# Patient Record
Sex: Female | Born: 1945
Health system: Southern US, Community
[De-identification: ages and names within clinical notes are randomized; demographics above are authoritative.]

## PROBLEM LIST (undated history)

## (undated) DIAGNOSIS — C4442 Squamous cell carcinoma of skin of scalp and neck: Secondary | ICD-10-CM

## (undated) DIAGNOSIS — J189 Pneumonia, unspecified organism: Secondary | ICD-10-CM

## (undated) DIAGNOSIS — M1990 Unspecified osteoarthritis, unspecified site: Secondary | ICD-10-CM

## (undated) DIAGNOSIS — I422 Other hypertrophic cardiomyopathy: Secondary | ICD-10-CM

## (undated) DIAGNOSIS — R7303 Prediabetes: Secondary | ICD-10-CM

## (undated) DIAGNOSIS — D239 Other benign neoplasm of skin, unspecified: Secondary | ICD-10-CM

## (undated) DIAGNOSIS — I219 Acute myocardial infarction, unspecified: Secondary | ICD-10-CM

## (undated) DIAGNOSIS — J90 Pleural effusion, not elsewhere classified: Secondary | ICD-10-CM

## (undated) DIAGNOSIS — Z860100 Personal history of colon polyps, unspecified: Secondary | ICD-10-CM

## (undated) DIAGNOSIS — D649 Anemia, unspecified: Secondary | ICD-10-CM

## (undated) DIAGNOSIS — I499 Cardiac arrhythmia, unspecified: Secondary | ICD-10-CM

## (undated) DIAGNOSIS — N189 Chronic kidney disease, unspecified: Secondary | ICD-10-CM

## (undated) DIAGNOSIS — I509 Heart failure, unspecified: Secondary | ICD-10-CM

## (undated) HISTORY — DX: Pleural effusion, not elsewhere classified: J90

## (undated) HISTORY — DX: Unspecified osteoarthritis, unspecified site: M19.90

## (undated) HISTORY — PX: DILATION AND CURETTAGE OF UTERUS: SHX78

## (undated) HISTORY — DX: Other benign neoplasm of skin, unspecified: D23.9

## (undated) HISTORY — DX: Personal history of colonic polyps: Z86.010

## (undated) HISTORY — DX: Personal history of colon polyps, unspecified: Z86.0100

## (undated) HISTORY — DX: Anemia, unspecified: D64.9

---
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
## 1966-06-06 HISTORY — PX: DILATION AND CURETTAGE OF UTERUS: SHX78

## 1971-06-07 HISTORY — PX: BREAST BIOPSY: SHX20

## 1975-06-07 HISTORY — PX: TUBAL LIGATION: SHX77

## 2012-03-09 DIAGNOSIS — N3000 Acute cystitis without hematuria: Secondary | ICD-10-CM | POA: Diagnosis not present

## 2012-03-09 DIAGNOSIS — Z1231 Encounter for screening mammogram for malignant neoplasm of breast: Secondary | ICD-10-CM | POA: Diagnosis not present

## 2012-03-09 DIAGNOSIS — I10 Essential (primary) hypertension: Secondary | ICD-10-CM | POA: Diagnosis not present

## 2012-03-09 DIAGNOSIS — Z23 Encounter for immunization: Secondary | ICD-10-CM | POA: Diagnosis not present

## 2012-03-09 DIAGNOSIS — I119 Hypertensive heart disease without heart failure: Secondary | ICD-10-CM | POA: Diagnosis not present

## 2012-03-09 DIAGNOSIS — Z79899 Other long term (current) drug therapy: Secondary | ICD-10-CM | POA: Diagnosis not present

## 2012-03-09 DIAGNOSIS — Z124 Encounter for screening for malignant neoplasm of cervix: Secondary | ICD-10-CM | POA: Diagnosis not present

## 2012-03-09 DIAGNOSIS — Z1211 Encounter for screening for malignant neoplasm of colon: Secondary | ICD-10-CM | POA: Diagnosis not present

## 2012-03-09 DIAGNOSIS — H8109 Meniere's disease, unspecified ear: Secondary | ICD-10-CM | POA: Diagnosis not present

## 2012-03-13 DIAGNOSIS — R9431 Abnormal electrocardiogram [ECG] [EKG]: Secondary | ICD-10-CM | POA: Diagnosis not present

## 2012-03-13 DIAGNOSIS — I369 Nonrheumatic tricuspid valve disorder, unspecified: Secondary | ICD-10-CM | POA: Diagnosis not present

## 2012-03-13 DIAGNOSIS — I119 Hypertensive heart disease without heart failure: Secondary | ICD-10-CM | POA: Diagnosis not present

## 2012-03-22 DIAGNOSIS — N3000 Acute cystitis without hematuria: Secondary | ICD-10-CM | POA: Diagnosis not present

## 2012-04-30 DIAGNOSIS — Z1231 Encounter for screening mammogram for malignant neoplasm of breast: Secondary | ICD-10-CM | POA: Diagnosis not present

## 2012-06-19 DIAGNOSIS — N182 Chronic kidney disease, stage 2 (mild): Secondary | ICD-10-CM | POA: Diagnosis not present

## 2013-01-24 DIAGNOSIS — M25569 Pain in unspecified knee: Secondary | ICD-10-CM | POA: Diagnosis not present

## 2013-02-28 DIAGNOSIS — H905 Unspecified sensorineural hearing loss: Secondary | ICD-10-CM | POA: Diagnosis not present

## 2013-02-28 DIAGNOSIS — H903 Sensorineural hearing loss, bilateral: Secondary | ICD-10-CM | POA: Diagnosis not present

## 2013-02-28 DIAGNOSIS — H8109 Meniere's disease, unspecified ear: Secondary | ICD-10-CM | POA: Diagnosis not present

## 2013-03-18 DIAGNOSIS — Z23 Encounter for immunization: Secondary | ICD-10-CM | POA: Diagnosis not present

## 2013-03-18 DIAGNOSIS — Z1211 Encounter for screening for malignant neoplasm of colon: Secondary | ICD-10-CM | POA: Diagnosis not present

## 2013-03-18 DIAGNOSIS — Z79899 Other long term (current) drug therapy: Secondary | ICD-10-CM | POA: Diagnosis not present

## 2013-03-18 DIAGNOSIS — H8109 Meniere's disease, unspecified ear: Secondary | ICD-10-CM | POA: Diagnosis not present

## 2013-03-18 DIAGNOSIS — Z0000 Encounter for general adult medical examination without abnormal findings: Secondary | ICD-10-CM | POA: Diagnosis not present

## 2013-03-18 DIAGNOSIS — E782 Mixed hyperlipidemia: Secondary | ICD-10-CM | POA: Diagnosis not present

## 2013-03-18 DIAGNOSIS — M899 Disorder of bone, unspecified: Secondary | ICD-10-CM | POA: Diagnosis not present

## 2013-03-18 DIAGNOSIS — I10 Essential (primary) hypertension: Secondary | ICD-10-CM | POA: Diagnosis not present

## 2013-04-17 DIAGNOSIS — I10 Essential (primary) hypertension: Secondary | ICD-10-CM | POA: Diagnosis not present

## 2013-04-17 DIAGNOSIS — H25049 Posterior subcapsular polar age-related cataract, unspecified eye: Secondary | ICD-10-CM | POA: Diagnosis not present

## 2013-04-17 DIAGNOSIS — H43819 Vitreous degeneration, unspecified eye: Secondary | ICD-10-CM | POA: Diagnosis not present

## 2013-04-17 DIAGNOSIS — H524 Presbyopia: Secondary | ICD-10-CM | POA: Diagnosis not present

## 2013-04-29 DIAGNOSIS — M79609 Pain in unspecified limb: Secondary | ICD-10-CM | POA: Diagnosis not present

## 2013-04-29 DIAGNOSIS — R609 Edema, unspecified: Secondary | ICD-10-CM | POA: Diagnosis not present

## 2013-04-29 DIAGNOSIS — I8000 Phlebitis and thrombophlebitis of superficial vessels of unspecified lower extremity: Secondary | ICD-10-CM | POA: Diagnosis not present

## 2013-05-01 DIAGNOSIS — Z1231 Encounter for screening mammogram for malignant neoplasm of breast: Secondary | ICD-10-CM | POA: Diagnosis not present

## 2014-03-12 DIAGNOSIS — I700 Atherosclerosis of aorta: Secondary | ICD-10-CM | POA: Diagnosis not present

## 2014-03-12 DIAGNOSIS — R002 Palpitations: Secondary | ICD-10-CM | POA: Diagnosis not present

## 2014-03-12 DIAGNOSIS — R072 Precordial pain: Secondary | ICD-10-CM | POA: Diagnosis not present

## 2014-03-12 DIAGNOSIS — I499 Cardiac arrhythmia, unspecified: Secondary | ICD-10-CM | POA: Diagnosis not present

## 2014-03-12 DIAGNOSIS — R079 Chest pain, unspecified: Secondary | ICD-10-CM | POA: Diagnosis not present

## 2014-03-12 DIAGNOSIS — R0602 Shortness of breath: Secondary | ICD-10-CM | POA: Diagnosis not present

## 2014-03-12 DIAGNOSIS — I4891 Unspecified atrial fibrillation: Secondary | ICD-10-CM | POA: Diagnosis not present

## 2014-03-13 DIAGNOSIS — I499 Cardiac arrhythmia, unspecified: Secondary | ICD-10-CM | POA: Diagnosis not present

## 2014-03-13 DIAGNOSIS — I480 Paroxysmal atrial fibrillation: Secondary | ICD-10-CM | POA: Diagnosis not present

## 2014-03-13 DIAGNOSIS — Z23 Encounter for immunization: Secondary | ICD-10-CM | POA: Diagnosis not present

## 2014-03-13 DIAGNOSIS — R0602 Shortness of breath: Secondary | ICD-10-CM | POA: Diagnosis not present

## 2014-03-13 DIAGNOSIS — I700 Atherosclerosis of aorta: Secondary | ICD-10-CM | POA: Diagnosis not present

## 2014-03-20 DIAGNOSIS — Z8639 Personal history of other endocrine, nutritional and metabolic disease: Secondary | ICD-10-CM | POA: Diagnosis not present

## 2014-03-20 DIAGNOSIS — I4891 Unspecified atrial fibrillation: Secondary | ICD-10-CM | POA: Diagnosis not present

## 2014-03-20 DIAGNOSIS — Z7901 Long term (current) use of anticoagulants: Secondary | ICD-10-CM | POA: Diagnosis not present

## 2014-03-20 DIAGNOSIS — I119 Hypertensive heart disease without heart failure: Secondary | ICD-10-CM | POA: Diagnosis not present

## 2014-03-20 DIAGNOSIS — Z8709 Personal history of other diseases of the respiratory system: Secondary | ICD-10-CM | POA: Diagnosis not present

## 2014-04-09 DIAGNOSIS — Z0000 Encounter for general adult medical examination without abnormal findings: Secondary | ICD-10-CM | POA: Diagnosis not present

## 2014-04-09 DIAGNOSIS — Z79899 Other long term (current) drug therapy: Secondary | ICD-10-CM | POA: Diagnosis not present

## 2014-04-09 DIAGNOSIS — I480 Paroxysmal atrial fibrillation: Secondary | ICD-10-CM | POA: Diagnosis not present

## 2014-04-09 DIAGNOSIS — I119 Hypertensive heart disease without heart failure: Secondary | ICD-10-CM | POA: Diagnosis not present

## 2014-04-09 DIAGNOSIS — H8109 Meniere's disease, unspecified ear: Secondary | ICD-10-CM | POA: Diagnosis not present

## 2014-04-09 DIAGNOSIS — Z1211 Encounter for screening for malignant neoplasm of colon: Secondary | ICD-10-CM | POA: Diagnosis not present

## 2014-04-09 DIAGNOSIS — E780 Pure hypercholesterolemia: Secondary | ICD-10-CM | POA: Diagnosis not present

## 2014-04-09 DIAGNOSIS — I4891 Unspecified atrial fibrillation: Secondary | ICD-10-CM | POA: Diagnosis not present

## 2014-04-09 DIAGNOSIS — I10 Essential (primary) hypertension: Secondary | ICD-10-CM | POA: Diagnosis not present

## 2014-04-14 DIAGNOSIS — R151 Fecal smearing: Secondary | ICD-10-CM | POA: Diagnosis not present

## 2014-04-14 DIAGNOSIS — R278 Other lack of coordination: Secondary | ICD-10-CM | POA: Diagnosis not present

## 2014-04-14 DIAGNOSIS — M6281 Muscle weakness (generalized): Secondary | ICD-10-CM | POA: Diagnosis not present

## 2014-04-14 DIAGNOSIS — N3946 Mixed incontinence: Secondary | ICD-10-CM | POA: Diagnosis not present

## 2014-04-21 DIAGNOSIS — R151 Fecal smearing: Secondary | ICD-10-CM | POA: Diagnosis not present

## 2014-04-21 DIAGNOSIS — N3946 Mixed incontinence: Secondary | ICD-10-CM | POA: Diagnosis not present

## 2014-04-21 DIAGNOSIS — M6281 Muscle weakness (generalized): Secondary | ICD-10-CM | POA: Diagnosis not present

## 2014-04-21 DIAGNOSIS — R278 Other lack of coordination: Secondary | ICD-10-CM | POA: Diagnosis not present

## 2014-05-02 DIAGNOSIS — Z1231 Encounter for screening mammogram for malignant neoplasm of breast: Secondary | ICD-10-CM | POA: Diagnosis not present

## 2014-05-06 DIAGNOSIS — M6281 Muscle weakness (generalized): Secondary | ICD-10-CM | POA: Diagnosis not present

## 2014-05-06 DIAGNOSIS — R278 Other lack of coordination: Secondary | ICD-10-CM | POA: Diagnosis not present

## 2014-05-06 DIAGNOSIS — R151 Fecal smearing: Secondary | ICD-10-CM | POA: Diagnosis not present

## 2014-05-06 DIAGNOSIS — N3946 Mixed incontinence: Secondary | ICD-10-CM | POA: Diagnosis not present

## 2014-05-20 DIAGNOSIS — N3946 Mixed incontinence: Secondary | ICD-10-CM | POA: Diagnosis not present

## 2014-05-20 DIAGNOSIS — R151 Fecal smearing: Secondary | ICD-10-CM | POA: Diagnosis not present

## 2014-05-20 DIAGNOSIS — R278 Other lack of coordination: Secondary | ICD-10-CM | POA: Diagnosis not present

## 2014-05-20 DIAGNOSIS — M6281 Muscle weakness (generalized): Secondary | ICD-10-CM | POA: Diagnosis not present

## 2014-06-03 DIAGNOSIS — M6281 Muscle weakness (generalized): Secondary | ICD-10-CM | POA: Diagnosis not present

## 2014-06-03 DIAGNOSIS — N3946 Mixed incontinence: Secondary | ICD-10-CM | POA: Diagnosis not present

## 2014-06-03 DIAGNOSIS — R151 Fecal smearing: Secondary | ICD-10-CM | POA: Diagnosis not present

## 2014-06-03 DIAGNOSIS — R278 Other lack of coordination: Secondary | ICD-10-CM | POA: Diagnosis not present

## 2014-07-16 DIAGNOSIS — H5211 Myopia, right eye: Secondary | ICD-10-CM | POA: Diagnosis not present

## 2014-07-16 DIAGNOSIS — H52223 Regular astigmatism, bilateral: Secondary | ICD-10-CM | POA: Diagnosis not present

## 2014-07-16 DIAGNOSIS — H524 Presbyopia: Secondary | ICD-10-CM | POA: Diagnosis not present

## 2014-07-16 DIAGNOSIS — H43312 Vitreous membranes and strands, left eye: Secondary | ICD-10-CM | POA: Diagnosis not present

## 2014-07-16 DIAGNOSIS — H5202 Hypermetropia, left eye: Secondary | ICD-10-CM | POA: Diagnosis not present

## 2014-07-16 DIAGNOSIS — I10 Essential (primary) hypertension: Secondary | ICD-10-CM | POA: Diagnosis not present

## 2014-09-02 DIAGNOSIS — A932 Colorado tick fever: Secondary | ICD-10-CM | POA: Diagnosis not present

## 2014-09-02 DIAGNOSIS — A681 Tick-borne relapsing fever: Secondary | ICD-10-CM | POA: Diagnosis not present

## 2014-09-22 DIAGNOSIS — M654 Radial styloid tenosynovitis [de Quervain]: Secondary | ICD-10-CM | POA: Diagnosis not present

## 2014-11-05 DIAGNOSIS — M654 Radial styloid tenosynovitis [de Quervain]: Secondary | ICD-10-CM | POA: Diagnosis not present

## 2015-04-17 DIAGNOSIS — Z23 Encounter for immunization: Secondary | ICD-10-CM | POA: Diagnosis not present

## 2015-04-17 DIAGNOSIS — I10 Essential (primary) hypertension: Secondary | ICD-10-CM | POA: Diagnosis not present

## 2015-04-17 DIAGNOSIS — Z79899 Other long term (current) drug therapy: Secondary | ICD-10-CM | POA: Diagnosis not present

## 2015-04-17 DIAGNOSIS — Z0000 Encounter for general adult medical examination without abnormal findings: Secondary | ICD-10-CM | POA: Diagnosis not present

## 2015-04-17 DIAGNOSIS — I480 Paroxysmal atrial fibrillation: Secondary | ICD-10-CM | POA: Diagnosis not present

## 2015-04-17 DIAGNOSIS — I119 Hypertensive heart disease without heart failure: Secondary | ICD-10-CM | POA: Diagnosis not present

## 2015-05-11 DIAGNOSIS — Z803 Family history of malignant neoplasm of breast: Secondary | ICD-10-CM | POA: Diagnosis not present

## 2015-05-11 DIAGNOSIS — Z1231 Encounter for screening mammogram for malignant neoplasm of breast: Secondary | ICD-10-CM | POA: Diagnosis not present

## 2015-08-06 DIAGNOSIS — L821 Other seborrheic keratosis: Secondary | ICD-10-CM | POA: Diagnosis not present

## 2015-08-06 DIAGNOSIS — L501 Idiopathic urticaria: Secondary | ICD-10-CM | POA: Diagnosis not present

## 2015-08-17 DIAGNOSIS — J111 Influenza due to unidentified influenza virus with other respiratory manifestations: Secondary | ICD-10-CM | POA: Diagnosis not present

## 2015-08-17 DIAGNOSIS — R05 Cough: Secondary | ICD-10-CM | POA: Diagnosis not present

## 2015-08-17 DIAGNOSIS — R531 Weakness: Secondary | ICD-10-CM | POA: Diagnosis not present

## 2015-08-27 DIAGNOSIS — L501 Idiopathic urticaria: Secondary | ICD-10-CM | POA: Diagnosis not present

## 2015-09-22 DIAGNOSIS — H25813 Combined forms of age-related cataract, bilateral: Secondary | ICD-10-CM | POA: Diagnosis not present

## 2015-09-22 DIAGNOSIS — I10 Essential (primary) hypertension: Secondary | ICD-10-CM | POA: Diagnosis not present

## 2015-09-22 DIAGNOSIS — H52223 Regular astigmatism, bilateral: Secondary | ICD-10-CM | POA: Diagnosis not present

## 2015-09-22 DIAGNOSIS — H5211 Myopia, right eye: Secondary | ICD-10-CM | POA: Diagnosis not present

## 2015-09-22 DIAGNOSIS — H43393 Other vitreous opacities, bilateral: Secondary | ICD-10-CM | POA: Diagnosis not present

## 2015-09-22 DIAGNOSIS — H43813 Vitreous degeneration, bilateral: Secondary | ICD-10-CM | POA: Diagnosis not present

## 2015-09-22 DIAGNOSIS — H5202 Hypermetropia, left eye: Secondary | ICD-10-CM | POA: Diagnosis not present

## 2015-09-22 DIAGNOSIS — H524 Presbyopia: Secondary | ICD-10-CM | POA: Diagnosis not present

## 2016-03-15 DIAGNOSIS — Z23 Encounter for immunization: Secondary | ICD-10-CM | POA: Diagnosis not present

## 2016-04-18 DIAGNOSIS — Z79899 Other long term (current) drug therapy: Secondary | ICD-10-CM | POA: Diagnosis not present

## 2016-04-18 DIAGNOSIS — I480 Paroxysmal atrial fibrillation: Secondary | ICD-10-CM | POA: Diagnosis not present

## 2016-04-18 DIAGNOSIS — I10 Essential (primary) hypertension: Secondary | ICD-10-CM | POA: Diagnosis not present

## 2016-04-18 DIAGNOSIS — M8580 Other specified disorders of bone density and structure, unspecified site: Secondary | ICD-10-CM | POA: Diagnosis not present

## 2016-04-18 DIAGNOSIS — I119 Hypertensive heart disease without heart failure: Secondary | ICD-10-CM | POA: Diagnosis not present

## 2016-04-18 DIAGNOSIS — Z0000 Encounter for general adult medical examination without abnormal findings: Secondary | ICD-10-CM | POA: Diagnosis not present

## 2016-05-03 DIAGNOSIS — D225 Melanocytic nevi of trunk: Secondary | ICD-10-CM | POA: Diagnosis not present

## 2016-05-03 DIAGNOSIS — L814 Other melanin hyperpigmentation: Secondary | ICD-10-CM | POA: Diagnosis not present

## 2016-05-03 DIAGNOSIS — L432 Lichenoid drug reaction: Secondary | ICD-10-CM | POA: Diagnosis not present

## 2016-05-17 DIAGNOSIS — Z1231 Encounter for screening mammogram for malignant neoplasm of breast: Secondary | ICD-10-CM | POA: Diagnosis not present

## 2016-06-27 DIAGNOSIS — D225 Melanocytic nevi of trunk: Secondary | ICD-10-CM | POA: Diagnosis not present

## 2016-06-27 DIAGNOSIS — D224 Melanocytic nevi of scalp and neck: Secondary | ICD-10-CM | POA: Diagnosis not present

## 2016-06-27 DIAGNOSIS — D229 Melanocytic nevi, unspecified: Secondary | ICD-10-CM | POA: Diagnosis not present

## 2016-07-15 DIAGNOSIS — D239 Other benign neoplasm of skin, unspecified: Secondary | ICD-10-CM | POA: Diagnosis not present

## 2016-07-15 DIAGNOSIS — D2262 Melanocytic nevi of left upper limb, including shoulder: Secondary | ICD-10-CM | POA: Diagnosis not present

## 2016-07-15 DIAGNOSIS — D235 Other benign neoplasm of skin of trunk: Secondary | ICD-10-CM | POA: Diagnosis not present

## 2016-07-15 DIAGNOSIS — D225 Melanocytic nevi of trunk: Secondary | ICD-10-CM | POA: Diagnosis not present

## 2016-07-29 DIAGNOSIS — D235 Other benign neoplasm of skin of trunk: Secondary | ICD-10-CM | POA: Diagnosis not present

## 2016-07-29 DIAGNOSIS — D239 Other benign neoplasm of skin, unspecified: Secondary | ICD-10-CM | POA: Diagnosis not present

## 2016-08-01 DIAGNOSIS — D2271 Melanocytic nevi of right lower limb, including hip: Secondary | ICD-10-CM | POA: Diagnosis not present

## 2016-08-01 DIAGNOSIS — D224 Melanocytic nevi of scalp and neck: Secondary | ICD-10-CM | POA: Diagnosis not present

## 2016-08-01 DIAGNOSIS — D2272 Melanocytic nevi of left lower limb, including hip: Secondary | ICD-10-CM | POA: Diagnosis not present

## 2016-08-01 DIAGNOSIS — D2262 Melanocytic nevi of left upper limb, including shoulder: Secondary | ICD-10-CM | POA: Diagnosis not present

## 2016-08-15 DIAGNOSIS — D239 Other benign neoplasm of skin, unspecified: Secondary | ICD-10-CM | POA: Diagnosis not present

## 2016-08-15 DIAGNOSIS — D235 Other benign neoplasm of skin of trunk: Secondary | ICD-10-CM | POA: Diagnosis not present

## 2016-08-15 DIAGNOSIS — D224 Melanocytic nevi of scalp and neck: Secondary | ICD-10-CM | POA: Diagnosis not present

## 2016-08-15 DIAGNOSIS — L988 Other specified disorders of the skin and subcutaneous tissue: Secondary | ICD-10-CM | POA: Diagnosis not present

## 2016-09-29 DIAGNOSIS — Z86010 Personal history of colonic polyps: Secondary | ICD-10-CM | POA: Diagnosis not present

## 2016-09-29 DIAGNOSIS — Z1211 Encounter for screening for malignant neoplasm of colon: Secondary | ICD-10-CM | POA: Diagnosis not present

## 2016-11-07 DIAGNOSIS — K5730 Diverticulosis of large intestine without perforation or abscess without bleeding: Secondary | ICD-10-CM | POA: Diagnosis not present

## 2016-11-07 DIAGNOSIS — D122 Benign neoplasm of ascending colon: Secondary | ICD-10-CM | POA: Diagnosis not present

## 2016-11-07 DIAGNOSIS — K648 Other hemorrhoids: Secondary | ICD-10-CM | POA: Diagnosis not present

## 2016-11-07 DIAGNOSIS — K635 Polyp of colon: Secondary | ICD-10-CM | POA: Diagnosis not present

## 2016-11-07 DIAGNOSIS — Z86010 Personal history of colonic polyps: Secondary | ICD-10-CM | POA: Diagnosis not present

## 2016-11-07 DIAGNOSIS — Z1211 Encounter for screening for malignant neoplasm of colon: Secondary | ICD-10-CM | POA: Diagnosis not present

## 2016-11-07 DIAGNOSIS — I4891 Unspecified atrial fibrillation: Secondary | ICD-10-CM | POA: Diagnosis not present

## 2016-11-07 DIAGNOSIS — I10 Essential (primary) hypertension: Secondary | ICD-10-CM | POA: Diagnosis not present

## 2016-11-07 HISTORY — PX: COLONOSCOPY: SHX174

## 2016-12-01 DIAGNOSIS — H43393 Other vitreous opacities, bilateral: Secondary | ICD-10-CM | POA: Diagnosis not present

## 2016-12-01 DIAGNOSIS — H5213 Myopia, bilateral: Secondary | ICD-10-CM | POA: Diagnosis not present

## 2016-12-01 DIAGNOSIS — H25813 Combined forms of age-related cataract, bilateral: Secondary | ICD-10-CM | POA: Diagnosis not present

## 2016-12-01 DIAGNOSIS — H52223 Regular astigmatism, bilateral: Secondary | ICD-10-CM | POA: Diagnosis not present

## 2016-12-01 DIAGNOSIS — H524 Presbyopia: Secondary | ICD-10-CM | POA: Diagnosis not present

## 2016-12-01 DIAGNOSIS — H43813 Vitreous degeneration, bilateral: Secondary | ICD-10-CM | POA: Diagnosis not present

## 2016-12-01 DIAGNOSIS — I10 Essential (primary) hypertension: Secondary | ICD-10-CM | POA: Diagnosis not present

## 2016-12-14 DIAGNOSIS — L814 Other melanin hyperpigmentation: Secondary | ICD-10-CM | POA: Diagnosis not present

## 2016-12-14 DIAGNOSIS — Z87898 Personal history of other specified conditions: Secondary | ICD-10-CM

## 2016-12-14 DIAGNOSIS — D0322 Melanoma in situ of left ear and external auricular canal: Secondary | ICD-10-CM | POA: Diagnosis not present

## 2016-12-14 DIAGNOSIS — C4322 Malignant melanoma of left ear and external auricular canal: Secondary | ICD-10-CM | POA: Diagnosis not present

## 2016-12-14 DIAGNOSIS — L821 Other seborrheic keratosis: Secondary | ICD-10-CM | POA: Diagnosis not present

## 2016-12-14 DIAGNOSIS — Z872 Personal history of diseases of the skin and subcutaneous tissue: Secondary | ICD-10-CM | POA: Diagnosis not present

## 2016-12-14 DIAGNOSIS — D489 Neoplasm of uncertain behavior, unspecified: Secondary | ICD-10-CM | POA: Diagnosis not present

## 2016-12-14 HISTORY — DX: Personal history of other specified conditions: Z87.898

## 2017-01-16 DIAGNOSIS — M1712 Unilateral primary osteoarthritis, left knee: Secondary | ICD-10-CM | POA: Diagnosis not present

## 2017-01-17 DIAGNOSIS — C4322 Malignant melanoma of left ear and external auricular canal: Secondary | ICD-10-CM | POA: Insufficient documentation

## 2017-01-17 HISTORY — DX: Malignant melanoma of left ear and external auricular canal: C43.22

## 2017-01-23 DIAGNOSIS — C4322 Malignant melanoma of left ear and external auricular canal: Secondary | ICD-10-CM | POA: Diagnosis not present

## 2017-01-30 DIAGNOSIS — I480 Paroxysmal atrial fibrillation: Secondary | ICD-10-CM | POA: Diagnosis not present

## 2017-02-02 DIAGNOSIS — I480 Paroxysmal atrial fibrillation: Secondary | ICD-10-CM | POA: Diagnosis not present

## 2017-02-02 DIAGNOSIS — Z79899 Other long term (current) drug therapy: Secondary | ICD-10-CM | POA: Diagnosis not present

## 2017-02-02 DIAGNOSIS — Z7901 Long term (current) use of anticoagulants: Secondary | ICD-10-CM | POA: Diagnosis not present

## 2017-02-02 DIAGNOSIS — D2222 Melanocytic nevi of left ear and external auricular canal: Secondary | ICD-10-CM | POA: Diagnosis not present

## 2017-02-02 DIAGNOSIS — H8109 Meniere's disease, unspecified ear: Secondary | ICD-10-CM | POA: Diagnosis not present

## 2017-02-02 DIAGNOSIS — M1990 Unspecified osteoarthritis, unspecified site: Secondary | ICD-10-CM | POA: Diagnosis not present

## 2017-02-02 DIAGNOSIS — I10 Essential (primary) hypertension: Secondary | ICD-10-CM | POA: Diagnosis not present

## 2017-02-02 DIAGNOSIS — Z9049 Acquired absence of other specified parts of digestive tract: Secondary | ICD-10-CM | POA: Diagnosis not present

## 2017-02-02 DIAGNOSIS — C4322 Malignant melanoma of left ear and external auricular canal: Secondary | ICD-10-CM | POA: Diagnosis not present

## 2017-02-13 DIAGNOSIS — C4322 Malignant melanoma of left ear and external auricular canal: Secondary | ICD-10-CM | POA: Diagnosis not present

## 2017-02-17 DIAGNOSIS — M1712 Unilateral primary osteoarthritis, left knee: Secondary | ICD-10-CM | POA: Diagnosis not present

## 2017-02-20 DIAGNOSIS — Z23 Encounter for immunization: Secondary | ICD-10-CM | POA: Diagnosis not present

## 2017-02-27 DIAGNOSIS — C4322 Malignant melanoma of left ear and external auricular canal: Secondary | ICD-10-CM | POA: Diagnosis not present

## 2017-04-26 DIAGNOSIS — L304 Erythema intertrigo: Secondary | ICD-10-CM | POA: Diagnosis not present

## 2017-04-26 DIAGNOSIS — N3941 Urge incontinence: Secondary | ICD-10-CM | POA: Diagnosis not present

## 2017-04-26 DIAGNOSIS — D239 Other benign neoplasm of skin, unspecified: Secondary | ICD-10-CM | POA: Diagnosis not present

## 2017-04-26 DIAGNOSIS — I480 Paroxysmal atrial fibrillation: Secondary | ICD-10-CM | POA: Diagnosis not present

## 2017-04-26 DIAGNOSIS — Z79899 Other long term (current) drug therapy: Secondary | ICD-10-CM | POA: Diagnosis not present

## 2017-04-26 DIAGNOSIS — Z0000 Encounter for general adult medical examination without abnormal findings: Secondary | ICD-10-CM | POA: Diagnosis not present

## 2017-05-22 DIAGNOSIS — C4322 Malignant melanoma of left ear and external auricular canal: Secondary | ICD-10-CM | POA: Diagnosis not present

## 2017-06-02 DIAGNOSIS — Z1231 Encounter for screening mammogram for malignant neoplasm of breast: Secondary | ICD-10-CM | POA: Diagnosis not present

## 2017-06-06 HISTORY — PX: CATARACT EXTRACTION, BILATERAL: SHX1313

## 2017-06-06 HISTORY — PX: MOHS SURGERY: SHX181

## 2017-06-14 DIAGNOSIS — Z85820 Personal history of malignant melanoma of skin: Secondary | ICD-10-CM | POA: Diagnosis not present

## 2017-06-14 DIAGNOSIS — D489 Neoplasm of uncertain behavior, unspecified: Secondary | ICD-10-CM | POA: Diagnosis not present

## 2017-06-14 DIAGNOSIS — L814 Other melanin hyperpigmentation: Secondary | ICD-10-CM | POA: Diagnosis not present

## 2017-06-14 DIAGNOSIS — D229 Melanocytic nevi, unspecified: Secondary | ICD-10-CM | POA: Diagnosis not present

## 2017-06-16 DIAGNOSIS — Z85820 Personal history of malignant melanoma of skin: Secondary | ICD-10-CM

## 2017-06-16 DIAGNOSIS — C4322 Malignant melanoma of left ear and external auricular canal: Secondary | ICD-10-CM | POA: Diagnosis not present

## 2017-06-16 HISTORY — DX: Personal history of malignant melanoma of skin: Z85.820

## 2017-07-11 DIAGNOSIS — C4322 Malignant melanoma of left ear and external auricular canal: Secondary | ICD-10-CM | POA: Diagnosis not present

## 2017-09-18 DIAGNOSIS — J329 Chronic sinusitis, unspecified: Secondary | ICD-10-CM | POA: Diagnosis not present

## 2017-09-18 DIAGNOSIS — J40 Bronchitis, not specified as acute or chronic: Secondary | ICD-10-CM | POA: Diagnosis not present

## 2017-09-18 DIAGNOSIS — R509 Fever, unspecified: Secondary | ICD-10-CM | POA: Diagnosis not present

## 2017-09-18 DIAGNOSIS — R062 Wheezing: Secondary | ICD-10-CM | POA: Diagnosis not present

## 2017-09-19 DIAGNOSIS — Z87898 Personal history of other specified conditions: Secondary | ICD-10-CM | POA: Diagnosis not present

## 2017-09-19 DIAGNOSIS — Z85820 Personal history of malignant melanoma of skin: Secondary | ICD-10-CM | POA: Diagnosis not present

## 2017-09-19 DIAGNOSIS — L814 Other melanin hyperpigmentation: Secondary | ICD-10-CM | POA: Diagnosis not present

## 2017-09-19 DIAGNOSIS — L821 Other seborrheic keratosis: Secondary | ICD-10-CM | POA: Diagnosis not present

## 2017-11-20 DIAGNOSIS — C4322 Malignant melanoma of left ear and external auricular canal: Secondary | ICD-10-CM | POA: Diagnosis not present

## 2017-11-27 DIAGNOSIS — M1712 Unilateral primary osteoarthritis, left knee: Secondary | ICD-10-CM | POA: Diagnosis not present

## 2017-12-19 DIAGNOSIS — L814 Other melanin hyperpigmentation: Secondary | ICD-10-CM | POA: Diagnosis not present

## 2017-12-19 DIAGNOSIS — L821 Other seborrheic keratosis: Secondary | ICD-10-CM | POA: Diagnosis not present

## 2017-12-19 DIAGNOSIS — Z85820 Personal history of malignant melanoma of skin: Secondary | ICD-10-CM | POA: Diagnosis not present

## 2017-12-19 DIAGNOSIS — L7211 Pilar cyst: Secondary | ICD-10-CM | POA: Diagnosis not present

## 2017-12-19 DIAGNOSIS — Z87898 Personal history of other specified conditions: Secondary | ICD-10-CM | POA: Diagnosis not present

## 2018-01-30 DIAGNOSIS — H5211 Myopia, right eye: Secondary | ICD-10-CM | POA: Diagnosis not present

## 2018-01-30 DIAGNOSIS — H5202 Hypermetropia, left eye: Secondary | ICD-10-CM | POA: Diagnosis not present

## 2018-01-30 DIAGNOSIS — H52223 Regular astigmatism, bilateral: Secondary | ICD-10-CM | POA: Diagnosis not present

## 2018-01-30 DIAGNOSIS — H25813 Combined forms of age-related cataract, bilateral: Secondary | ICD-10-CM | POA: Diagnosis not present

## 2018-01-30 DIAGNOSIS — I10 Essential (primary) hypertension: Secondary | ICD-10-CM | POA: Diagnosis not present

## 2018-01-30 DIAGNOSIS — H524 Presbyopia: Secondary | ICD-10-CM | POA: Diagnosis not present

## 2018-01-30 DIAGNOSIS — H43813 Vitreous degeneration, bilateral: Secondary | ICD-10-CM | POA: Diagnosis not present

## 2018-02-27 DIAGNOSIS — Z23 Encounter for immunization: Secondary | ICD-10-CM | POA: Diagnosis not present

## 2018-03-02 ENCOUNTER — Ambulatory Visit (INDEPENDENT_AMBULATORY_CARE_PROVIDER_SITE_OTHER): Payer: Medicare Other

## 2018-03-02 ENCOUNTER — Encounter: Payer: Self-pay | Admitting: Sports Medicine

## 2018-03-02 ENCOUNTER — Other Ambulatory Visit: Payer: Self-pay

## 2018-03-02 ENCOUNTER — Ambulatory Visit (INDEPENDENT_AMBULATORY_CARE_PROVIDER_SITE_OTHER): Payer: Medicare Other | Admitting: Sports Medicine

## 2018-03-02 DIAGNOSIS — M722 Plantar fascial fibromatosis: Secondary | ICD-10-CM | POA: Diagnosis not present

## 2018-03-02 DIAGNOSIS — M79672 Pain in left foot: Secondary | ICD-10-CM | POA: Diagnosis not present

## 2018-03-02 MED ORDER — TRIAMCINOLONE ACETONIDE 40 MG/ML IJ SUSP: 20.0000 mg | Freq: Once | INTRAMUSCULAR | Status: DC

## 2018-03-02 NOTE — Progress Notes (Signed)
   Subjective:    Patient ID: Margaretha Sheffield, female    DOB: [birth year], 72 y.o.   MRN: [id number]  HPI    Review of Systems  Musculoskeletal: Positive for myalgias.  All other systems reviewed and are negative.      Objective:   Physical Exam        Assessment & Plan:

## 2018-03-02 NOTE — Patient Instructions (Signed)

## 2018-03-02 NOTE — Progress Notes (Signed)
Subjective: Monica Anderson is a 72 y.o. female patient presents to office with complaint of moderate heel pain on the left. Patient admits to post static dyskinesia for 1 week in duration. Patient reports that after she did mowing on yesterday pain was 10 out of 10 but before that the area was sore ranks 6 out of 10 does not recall any injury admits to a history of plantar fasciitis 30 years ago states that today her pain is 4-5 out of 10 and states that the pain is worse in the morning with first few steps out of bed.  Patient has tried Aleve and Voltaren gel states that she has history of issues with her knees which could have also been contributing to her recent flareup of pain.  Denies any other pedal complaints.   Review of Systems  Musculoskeletal: Positive for myalgias.  All other systems reviewed and are negative.    There are no active problems to display for this patient.   No current outpatient medications on file prior to visit.   No current facility-administered medications on file prior to visit.     Allergies not on file  Objective: Physical Exam General: The patient is alert and oriented x3 in no acute distress.  Dermatology: Skin is warm, dry and supple bilateral lower extremities. Nails 1-10 are normal. There is no erythema, edema, no eccymosis, no open lesions present. Integument is otherwise unremarkable.  Vascular: Dorsalis Pedis pulse and Posterior Tibial pulse are 1/4 bilateral. Capillary fill time is immediate to all digits.  Neurological: Grossly intact to light touch with an achilles reflex of +2/5 and a  negative Tinel's sign bilateral.  Musculoskeletal: Tenderness to palpation at the medial calcaneal tubercale and through the insertion of the plantar fascia on the left foot. No pain with compression of calcaneus bilateral. No pain with tuning fork to calcaneus bilateral. No pain with calf compression bilateral. There is decreased Ankle joint range of motion  bilateral. All other joints range of motion within normal limits bilateral. Strength 5/5 in all groups bilateral.   Xray, Left foot:  Normal osseous mineralization. Joint spaces preserved. No fracture/dislocation/boney destruction. Calcaneal spur present with mild thickening of plantar fascia. No other soft tissue abnormalities or radiopaque foreign bodies.   Assessment and Plan: Problem List Items Addressed This Visit    None    Visit Diagnoses    Plantar fasciitis of left foot    -  Primary   Relevant Medications   triamcinolone acetonide (KENALOG-40) injection 20 mg (Start on 03/02/2018  3:00 PM)   Left foot pain       Relevant Medications   triamcinolone acetonide (KENALOG-40) injection 20 mg (Start on 03/02/2018  3:00 PM)   Other Relevant Orders   DG Foot Complete Left      -Complete examination performed.  -Xrays reviewed -Discussed with patient in detail the condition of plantar fasciitis, how this occurs and general treatment options. Explained both conservative and surgical treatments.  -After oral consent and aseptic prep, injected a mixture containing 1 ml of 2%  plain lidocaine, 1 ml 0.5% plain marcaine, 0.5 ml of kenalog 40 and 0.5 ml of dexamethasone phosphate into left heel. Post-injection care discussed with patient.  -Recommended good supportive shoes and advised use of OTC insert. Explained to patient that if these orthoses work well, we will continue with these. If these do not improve her condition and  pain, we will consider custom molded orthoses. -Explained and dispensed to patient daily stretching  exercises. -Recommend patient to ice affected area 1-2x daily. -Patient to return to office in 3 weeks for follow up or sooner if problems or questions arise.  Landis Martins, DPM

## 2018-03-07 ENCOUNTER — Other Ambulatory Visit: Payer: Self-pay | Admitting: Sports Medicine

## 2018-03-07 DIAGNOSIS — M79672 Pain in left foot: Secondary | ICD-10-CM

## 2018-03-07 DIAGNOSIS — M722 Plantar fascial fibromatosis: Secondary | ICD-10-CM

## 2018-03-20 DIAGNOSIS — H1859 Other hereditary corneal dystrophies: Secondary | ICD-10-CM | POA: Diagnosis not present

## 2018-03-20 DIAGNOSIS — Z01818 Encounter for other preprocedural examination: Secondary | ICD-10-CM | POA: Diagnosis not present

## 2018-03-20 DIAGNOSIS — H25812 Combined forms of age-related cataract, left eye: Secondary | ICD-10-CM | POA: Diagnosis not present

## 2018-03-23 ENCOUNTER — Encounter: Payer: Self-pay | Admitting: Sports Medicine

## 2018-03-23 ENCOUNTER — Ambulatory Visit (INDEPENDENT_AMBULATORY_CARE_PROVIDER_SITE_OTHER): Payer: Medicare Other | Admitting: Sports Medicine

## 2018-03-23 DIAGNOSIS — M79672 Pain in left foot: Secondary | ICD-10-CM

## 2018-03-23 DIAGNOSIS — M722 Plantar fascial fibromatosis: Secondary | ICD-10-CM | POA: Diagnosis not present

## 2018-03-23 NOTE — Progress Notes (Signed)
Subjective: Monica Anderson is a 72 y.o. female returns to office for follow up evaluation after Left heel injection for plantar fasciitis, injection #1 administered 3 weeks ago. Patient states that the injection seems to help her pain; pain is now much better and has decreased in frequency to the area. Patient reports that she did have one episode where she feels a sharp pulling sensation that made her pain for a brief moment 10 out of 10 but otherwise no other episodes like this and feels like she is doing much better.  Patient denies any recent changes in medications or new problems since last visit.   There are no active problems to display for this patient.   Current Outpatient Medications on File Prior to Visit  Medication Sig Dispense Refill  . diclofenac sodium (VOLTAREN) 1 % GEL 2 (TWO) GRAM(S) APPLY 2 GRAMS TO AREA UP TO 4 TIMES DAILY  2  . diltiazem (CARDIZEM CD) 120 MG 24 hr capsule Take by mouth daily.  3  . ELIQUIS 5 MG TABS tablet Take 5 mg by mouth 2 (two) times daily.  3  . potassium chloride (K-DUR) 10 MEQ tablet Take 10 mEq by mouth daily.  12  . rosuvastatin (CRESTOR) 5 MG tablet Take 5 mg by mouth every evening.  12  . spironolactone-hydrochlorothiazide (ALDACTAZIDE) 25-25 MG tablet Take 0.5 tablets by mouth daily.  3  . tolterodine (DETROL LA) 4 MG 24 hr capsule Take 4 mg by mouth every evening.  12   Current Facility-Administered Medications on File Prior to Visit  Medication Dose Route Frequency Provider Last Rate Last Dose  . triamcinolone acetonide (KENALOG-40) injection 20 mg  20 mg Other Once Landis Martins, DPM        Not on File  Objective:   General:  Alert and oriented x 3, in no acute distress  Dermatology: Skin is warm, dry, and supple bilateral. Nails are within normal limits. There is no lower extremity erythema, no eccymosis, no open lesions present bilateral.   Vascular: Dorsalis Pedis and Posterior Tibial pedal pulses are 1/4 bilateral. + hair  growth noted bilateral. Capillary Fill Time is 3 seconds in all digits. No varicosities, No edema bilateral lower extremities.   Neurological: Sensation grossly intact to light touch with an achilles reflex of +2 and a  negative Tinel's sign bilateral. Vibratory, sharp/dull, Semmes Weinstein Monofilament within normal limits.   Musculoskeletal: There is decreased tenderness to palpation at the medial calcaneal tubercale and through the insertion of the plantar fascia on the Left foot. No pain with compression to calcaneus or application of tuning fork. There is decreased Ankle joint range of motion bilateral. All other jointsrange of motion  within normal limits bilateral. Strength 5/5 bilateral.   Assessment and Plan: Problem List Items Addressed This Visit    None    Visit Diagnoses    Plantar fasciitis of left foot    -  Primary   Left foot pain          -Complete examination performed.  -Previous x-rays reviewed. -Discussed with patient in detail the condition of plantar fasciitis, how this  occurs related to the foot type of the patient and general treatment options. -Since pain is much improved no reinjection at this visit. -Dispensed Left fascial brace to use at least for 1 month -Continue with stretching, icing, good supportive shoes, inserts daily.  -Discussed long term care and reocurrence; will closely monitor; if fails to improve will consider other treatment modalities.  -  Patient to return to office if the area flares up as needed for additional treatment and advised patient if she does experience a flareup that we will need to do a second injection.  Landis Martins, DPM

## 2018-04-17 DIAGNOSIS — E7800 Pure hypercholesterolemia, unspecified: Secondary | ICD-10-CM | POA: Diagnosis not present

## 2018-04-17 DIAGNOSIS — Z9842 Cataract extraction status, left eye: Secondary | ICD-10-CM | POA: Diagnosis not present

## 2018-04-17 DIAGNOSIS — Z9841 Cataract extraction status, right eye: Secondary | ICD-10-CM | POA: Diagnosis not present

## 2018-04-17 DIAGNOSIS — I10 Essential (primary) hypertension: Secondary | ICD-10-CM | POA: Diagnosis not present

## 2018-04-17 DIAGNOSIS — Z79899 Other long term (current) drug therapy: Secondary | ICD-10-CM | POA: Diagnosis not present

## 2018-04-17 DIAGNOSIS — H8109 Meniere's disease, unspecified ear: Secondary | ICD-10-CM | POA: Diagnosis not present

## 2018-04-17 DIAGNOSIS — I4891 Unspecified atrial fibrillation: Secondary | ICD-10-CM | POA: Diagnosis not present

## 2018-04-17 DIAGNOSIS — Z7901 Long term (current) use of anticoagulants: Secondary | ICD-10-CM | POA: Diagnosis not present

## 2018-04-17 DIAGNOSIS — H25812 Combined forms of age-related cataract, left eye: Secondary | ICD-10-CM | POA: Diagnosis not present

## 2018-04-17 DIAGNOSIS — Z961 Presence of intraocular lens: Secondary | ICD-10-CM | POA: Diagnosis not present

## 2018-04-17 DIAGNOSIS — H259 Unspecified age-related cataract: Secondary | ICD-10-CM | POA: Diagnosis not present

## 2018-04-30 DIAGNOSIS — Z0000 Encounter for general adult medical examination without abnormal findings: Secondary | ICD-10-CM | POA: Diagnosis not present

## 2018-04-30 DIAGNOSIS — E782 Mixed hyperlipidemia: Secondary | ICD-10-CM | POA: Diagnosis not present

## 2018-04-30 DIAGNOSIS — Z79899 Other long term (current) drug therapy: Secondary | ICD-10-CM | POA: Diagnosis not present

## 2018-04-30 DIAGNOSIS — I10 Essential (primary) hypertension: Secondary | ICD-10-CM | POA: Diagnosis not present

## 2018-05-01 DIAGNOSIS — L820 Inflamed seborrheic keratosis: Secondary | ICD-10-CM | POA: Diagnosis not present

## 2018-05-01 DIAGNOSIS — L814 Other melanin hyperpigmentation: Secondary | ICD-10-CM | POA: Diagnosis not present

## 2018-05-01 DIAGNOSIS — L720 Epidermal cyst: Secondary | ICD-10-CM | POA: Diagnosis not present

## 2018-05-01 DIAGNOSIS — Z85820 Personal history of malignant melanoma of skin: Secondary | ICD-10-CM | POA: Diagnosis not present

## 2018-05-01 DIAGNOSIS — Z87898 Personal history of other specified conditions: Secondary | ICD-10-CM | POA: Diagnosis not present

## 2018-05-01 DIAGNOSIS — L7211 Pilar cyst: Secondary | ICD-10-CM | POA: Diagnosis not present

## 2018-05-01 DIAGNOSIS — L821 Other seborrheic keratosis: Secondary | ICD-10-CM | POA: Diagnosis not present

## 2018-06-04 DIAGNOSIS — Z1231 Encounter for screening mammogram for malignant neoplasm of breast: Secondary | ICD-10-CM | POA: Diagnosis not present

## 2018-06-05 DIAGNOSIS — H25811 Combined forms of age-related cataract, right eye: Secondary | ICD-10-CM | POA: Diagnosis not present

## 2018-06-05 DIAGNOSIS — I10 Essential (primary) hypertension: Secondary | ICD-10-CM | POA: Diagnosis not present

## 2018-06-05 DIAGNOSIS — Z961 Presence of intraocular lens: Secondary | ICD-10-CM | POA: Diagnosis not present

## 2018-06-05 DIAGNOSIS — I4891 Unspecified atrial fibrillation: Secondary | ICD-10-CM | POA: Diagnosis not present

## 2018-06-05 DIAGNOSIS — Z7901 Long term (current) use of anticoagulants: Secondary | ICD-10-CM | POA: Diagnosis not present

## 2018-06-05 DIAGNOSIS — H8109 Meniere's disease, unspecified ear: Secondary | ICD-10-CM | POA: Diagnosis not present

## 2018-06-05 DIAGNOSIS — H259 Unspecified age-related cataract: Secondary | ICD-10-CM | POA: Diagnosis not present

## 2018-06-05 DIAGNOSIS — Z79899 Other long term (current) drug therapy: Secondary | ICD-10-CM | POA: Diagnosis not present

## 2018-06-05 DIAGNOSIS — Z9842 Cataract extraction status, left eye: Secondary | ICD-10-CM | POA: Diagnosis not present

## 2018-06-05 DIAGNOSIS — M1990 Unspecified osteoarthritis, unspecified site: Secondary | ICD-10-CM | POA: Diagnosis not present

## 2018-06-05 DIAGNOSIS — E7800 Pure hypercholesterolemia, unspecified: Secondary | ICD-10-CM | POA: Diagnosis not present

## 2018-06-05 DIAGNOSIS — Z9841 Cataract extraction status, right eye: Secondary | ICD-10-CM | POA: Diagnosis not present

## 2018-11-14 DIAGNOSIS — D2260 Melanocytic nevi of unspecified upper limb, including shoulder: Secondary | ICD-10-CM | POA: Diagnosis not present

## 2018-11-14 DIAGNOSIS — L821 Other seborrheic keratosis: Secondary | ICD-10-CM | POA: Diagnosis not present

## 2018-11-14 DIAGNOSIS — Z85820 Personal history of malignant melanoma of skin: Secondary | ICD-10-CM | POA: Diagnosis not present

## 2018-11-14 DIAGNOSIS — D2270 Melanocytic nevi of unspecified lower limb, including hip: Secondary | ICD-10-CM | POA: Diagnosis not present

## 2018-11-14 DIAGNOSIS — D225 Melanocytic nevi of trunk: Secondary | ICD-10-CM | POA: Diagnosis not present

## 2018-11-14 DIAGNOSIS — Z87898 Personal history of other specified conditions: Secondary | ICD-10-CM | POA: Diagnosis not present

## 2018-11-14 DIAGNOSIS — L814 Other melanin hyperpigmentation: Secondary | ICD-10-CM | POA: Diagnosis not present

## 2018-11-14 DIAGNOSIS — L570 Actinic keratosis: Secondary | ICD-10-CM | POA: Diagnosis not present

## 2018-12-28 DIAGNOSIS — M1712 Unilateral primary osteoarthritis, left knee: Secondary | ICD-10-CM | POA: Diagnosis not present

## 2018-12-28 DIAGNOSIS — M25562 Pain in left knee: Secondary | ICD-10-CM | POA: Diagnosis not present

## 2019-03-29 DIAGNOSIS — M25562 Pain in left knee: Secondary | ICD-10-CM | POA: Diagnosis not present

## 2019-03-29 DIAGNOSIS — M1712 Unilateral primary osteoarthritis, left knee: Secondary | ICD-10-CM | POA: Diagnosis not present

## 2019-05-06 DIAGNOSIS — N3945 Continuous leakage: Secondary | ICD-10-CM | POA: Diagnosis not present

## 2019-05-17 DIAGNOSIS — L304 Erythema intertrigo: Secondary | ICD-10-CM | POA: Diagnosis not present

## 2019-05-17 DIAGNOSIS — L814 Other melanin hyperpigmentation: Secondary | ICD-10-CM | POA: Diagnosis not present

## 2019-05-17 DIAGNOSIS — L821 Other seborrheic keratosis: Secondary | ICD-10-CM | POA: Diagnosis not present

## 2019-05-17 DIAGNOSIS — L570 Actinic keratosis: Secondary | ICD-10-CM | POA: Diagnosis not present

## 2019-05-17 DIAGNOSIS — D229 Melanocytic nevi, unspecified: Secondary | ICD-10-CM | POA: Diagnosis not present

## 2019-05-17 DIAGNOSIS — Z85820 Personal history of malignant melanoma of skin: Secondary | ICD-10-CM | POA: Diagnosis not present

## 2019-05-17 DIAGNOSIS — L720 Epidermal cyst: Secondary | ICD-10-CM | POA: Diagnosis not present

## 2019-05-17 DIAGNOSIS — Z87898 Personal history of other specified conditions: Secondary | ICD-10-CM | POA: Diagnosis not present

## 2019-08-29 DIAGNOSIS — H8109 Meniere's disease, unspecified ear: Secondary | ICD-10-CM

## 2019-08-29 HISTORY — DX: Meniere's disease, unspecified ear: H81.09

## 2019-10-01 DIAGNOSIS — IMO0001 Reserved for inherently not codable concepts without codable children: Secondary | ICD-10-CM | POA: Insufficient documentation

## 2019-10-01 DIAGNOSIS — H9042 Sensorineural hearing loss, unilateral, left ear, with unrestricted hearing on the contralateral side: Secondary | ICD-10-CM | POA: Insufficient documentation

## 2019-10-01 HISTORY — DX: Reserved for inherently not codable concepts without codable children: IMO0001

## 2019-10-01 HISTORY — DX: Sensorineural hearing loss, unilateral, left ear, with unrestricted hearing on the contralateral side: H90.42

## 2019-10-02 ENCOUNTER — Other Ambulatory Visit: Payer: Self-pay

## 2019-10-02 DIAGNOSIS — I4891 Unspecified atrial fibrillation: Secondary | ICD-10-CM

## 2019-10-02 DIAGNOSIS — Z01818 Encounter for other preprocedural examination: Secondary | ICD-10-CM

## 2019-10-02 DIAGNOSIS — Z9049 Acquired absence of other specified parts of digestive tract: Secondary | ICD-10-CM | POA: Insufficient documentation

## 2019-10-02 HISTORY — DX: Unspecified atrial fibrillation: I48.91

## 2019-10-02 HISTORY — DX: Acquired absence of other specified parts of digestive tract: Z90.49

## 2019-10-02 HISTORY — DX: Encounter for other preprocedural examination: Z01.818

## 2019-10-03 ENCOUNTER — Ambulatory Visit (INDEPENDENT_AMBULATORY_CARE_PROVIDER_SITE_OTHER): Payer: Medicare Other | Admitting: Cardiology

## 2019-10-03 ENCOUNTER — Other Ambulatory Visit: Payer: Self-pay

## 2019-10-03 ENCOUNTER — Encounter: Payer: Self-pay | Admitting: Cardiology

## 2019-10-03 VITALS — BP 98/60 | HR 69 | Ht 66.5 in | Wt 184.0 lb

## 2019-10-03 DIAGNOSIS — I480 Paroxysmal atrial fibrillation: Secondary | ICD-10-CM | POA: Diagnosis not present

## 2019-10-03 DIAGNOSIS — E782 Mixed hyperlipidemia: Secondary | ICD-10-CM

## 2019-10-03 DIAGNOSIS — I10 Essential (primary) hypertension: Secondary | ICD-10-CM

## 2019-10-03 HISTORY — DX: Essential (primary) hypertension: I10

## 2019-10-03 HISTORY — DX: Mixed hyperlipidemia: E78.2

## 2019-10-03 NOTE — Patient Instructions (Signed)
Medication Instructions:  Your physician recommends that you continue on your current medications as directed. Please refer to the Current Medication list given to you today.  *If you need a refill on your cardiac medications before your next appointment, please call your pharmacy*   Lab Work: None.  If you have labs (blood work) drawn today and your tests are completely normal, you will receive your results only by: Marland Kitchen MyChart Message (if you have MyChart) OR . A paper copy in the mail If you have any lab test that is abnormal or we need to change your treatment, we will call you to review the results.   Testing/Procedures: Your physician has requested that you have an echocardiogram. Echocardiography is a painless test that uses sound waves to create images of your heart. It provides your doctor with information about the size and shape of your heart and how well your heart's chambers and valves are working. This procedure takes approximately one hour. There are no restrictions for this procedure.     Follow-Up: At [facility] you and your health needs are our priority.  As part of our continuing mission to provide you with exceptional heart care, we have created designated Provider Care Teams.  These Care Teams include your primary Cardiologist (physician) and Advanced Practice Providers (APPs -  Physician Assistants and Nurse Practitioners) who all work together to provide you with the care you need, when you need it.  We recommend signing up for the patient portal called "MyChart".  Sign up information is provided on this After Visit Summary.  MyChart is used to connect with patients for Virtual Visits (Telemedicine).  Patients are able to view lab/test results, encounter notes, upcoming appointments, etc.  Non-urgent messages can be sent to your provider as well.   To learn more about what you can do with MyChart, go to [email or web address].    Your next appointment:   6  month(s)  The format for your next appointment:   In Person  Provider:   Berniece Salines, DO   Other Instructions   Echocardiogram An echocardiogram is a procedure that uses painless sound waves (ultrasound) to produce an image of the heart. Images from an echocardiogram can provide important information about:  Signs of coronary artery disease (CAD).  Aneurysm detection. An aneurysm is a weak or damaged part of an artery wall that bulges out from the normal force of blood pumping through the body.  Heart size and shape. Changes in the size or shape of the heart can be associated with certain conditions, including heart failure, aneurysm, and CAD.  Heart muscle function.  Heart valve function.  Signs of a past heart attack.  Fluid buildup around the heart.  Thickening of the heart muscle.  A tumor or infectious growth around the heart valves. Tell a health care provider about:  Any allergies you have.  All medicines you are taking, including vitamins, herbs, eye drops, creams, and over-the-counter medicines.  Any blood disorders you have.  Any surgeries you have had.  Any medical conditions you have.  Whether you are pregnant or may be pregnant. What are the risks? Generally, this is a safe procedure. However, problems may occur, including:  Allergic reaction to dye (contrast) that may be used during the procedure. What happens before the procedure? No specific preparation is needed. You may eat and drink normally. What happens during the procedure?   An IV tube may be inserted into one of your veins.  You may  receive contrast through this tube. A contrast is an injection that improves the quality of the pictures from your heart.  A gel will be applied to your chest.  A wand-like tool (transducer) will be moved over your chest. The gel will help to transmit the sound waves from the transducer.  The sound waves will harmlessly bounce off of your heart to allow  the heart images to be captured in real-time motion. The images will be recorded on a computer. The procedure may vary among health care providers and hospitals. What happens after the procedure?  You may return to your normal, everyday life, including diet, activities, and medicines, unless your health care provider tells you not to do that. Summary  An echocardiogram is a procedure that uses painless sound waves (ultrasound) to produce an image of the heart.  Images from an echocardiogram can provide important information about the size and shape of your heart, heart muscle function, heart valve function, and fluid buildup around your heart.  You do not need to do anything to prepare before this procedure. You may eat and drink normally.  After the echocardiogram is completed, you may return to your normal, everyday life, unless your health care provider tells you not to do that. This information is not intended to replace advice given to you by your health care provider. Make sure you discuss any questions you have with your health care provider. Document Revised: 09/13/2018 Document Reviewed: 06/25/2016 Elsevier Patient Education  [place].

## 2019-10-03 NOTE — Progress Notes (Signed)
Cardiology Office Note:    Date:  10/03/2019   ID:  [name], DOB 1946/05/13, MRN [id number]  PCP:  Myer Peer, MD  Cardiologist:  Berniece Salines, DO  Electrophysiologist:  None  All yesterday and she just tried to call me again Referring MD: Myer Peer, MD   Chief Complaint  Patient presents with  . Atrial Fibrillation    Overdue for f/u    History of Present Illness:     Monica Anderson is a 74 y.o. female with a hx of Paroxysmal atrial fibrillation on Cardizem and Eliquis, hypertension, hyperlipidemia, Mnire's disease, recently diagnosed BPP[facility] presents today to reestablish cardiac care.  Patient tells me that she was diagnosed with atrial fibrillation in 2015 at that time start on Cardizem and Eliquis.  Today she offers no complaints.  Her main purpose for the visit is to reestablish cardiac care.  She denies any chest pain, shortness of breath, nausea, vomiting  Past Medical History:  Diagnosis Date  . Asymmetrical left sensorineural hearing loss 10/01/2019  . Atrial fibrillation ([place]) 10/02/2019  . History of appendectomy 10/02/2019  . History of atypical nevus 12/14/2016  . History of malignant melanoma of skin 06/16/2017  . 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, left ([place]) 01/17/2017  . Meniere's disease 08/29/2019  . Tubal ligation evaluation 10/02/2019    Past Surgical History:  Procedure Laterality Date  . BREAST BIOPSY  1973  . CATARACT EXTRACTION, BILATERAL  2019  . MOHS SURGERY Left 2019   Melanoma on left ear  . TUBAL LIGATION  1977    Current Medications: Current Meds  Medication Sig  . B Complex-C-Folic Acid (SUPER B COMPLEX/FA/VIT C PO) Take by mouth daily.  . Calcium Carbonate-Vit D-Min (CALCIUM 1200 PO) Take 1,200 mg by mouth daily.  . cholecalciferol (VITAMIN D3) 25 MCG (1000 UNIT) tablet Take 2,000 Units by mouth daily.  Marland Kitchen diltiazem (CARDIZEM CD) 120 MG 24 hr capsule Take by mouth daily.  Marland Kitchen ELIQUIS 5 MG TABS tablet  Take 5 mg by mouth 2 (two) times daily.  . Magnesium 500 MG CAPS Take 500 mg by mouth daily.  . Omega-3 Fatty Acids (FISH OIL) 1000 MG CAPS Take 1,000 mg by mouth 2 (two) times daily.  Marland Kitchen oxybutynin (DITROPAN-XL) 5 MG 24 hr tablet Take 5 mg by mouth daily.  . potassium chloride (KLOR-CON) 10 MEQ tablet Take 10 mEq by mouth daily.  . rosuvastatin (CRESTOR) 5 MG tablet Take 5 mg by mouth every evening.  Marland Kitchen spironolactone-hydrochlorothiazide (ALDACTAZIDE) 25-25 MG tablet Take 1 tablet by mouth daily.  Marland Kitchen telmisartan (MICARDIS) 80 MG tablet Take 80 mg by mouth daily.  Marland Kitchen tolterodine (DETROL LA) 4 MG 24 hr capsule Take 4 mg by mouth every evening.  . [DISCONTINUED] Biotin 5000 MCG TABS Take 5,000 mg by mouth daily.  . [DISCONTINUED] buPROPion (ZYBAN) 150 MG 12 hr tablet Take 150 mg by mouth 2 (two) times daily.  . [DISCONTINUED] topiramate (TOPAMAX) 25 MG tablet Take 25 mg by mouth at bedtime.   Current Facility-Administered Medications for the 10/03/19 encounter (Office Visit) with Berniece Salines, DO  Medication  . triamcinolone acetonide (KENALOG-40) injection 20 mg     Allergies:   Patient has no known allergies.   Social History   Socioeconomic History  . Marital status: Married    Spouse name: Not on file  . Number of children: Not on file  . Years of education: Not on file  . Highest education  level: Not on file  Occupational History  . Not on file  Tobacco Use  . Smoking status: Never Smoker  . Smokeless tobacco: Never Used  Substance and Sexual Activity  . Alcohol use: Yes    Comment: Wine once every 2 months  . Drug use: Never  . Sexual activity: Not on file  Other Topics Concern  . Not on file  Social History Narrative  . Not on file   Social Determinants of Health   Financial Resource Strain:   . Difficulty of Paying Living Expenses:   Food Insecurity:   . Worried About Charity fundraiser in the Last Year:   . Arboriculturist in the Last Year:   Transportation Needs:    . Film/video editor (Medical):   Marland Kitchen Lack of Transportation (Non-Medical):   Physical Activity:   . Days of Exercise per Week:   . Minutes of Exercise per Session:   Stress:   . Feeling of Stress :   Social Connections:   . Frequency of Communication with Friends and Family:   . Frequency of Social Gatherings with Friends and Family:   . Attends Religious Services:   . Active Member of Clubs or Organizations:   . Attends Archivist Meetings:   Marland Kitchen Marital Status:      Family History: The patient's family history includes Atrial fibrillation in her mother; Diabetes in her father; Hypertension in her father and mother; Stroke in her father.  ROS:   Review of Systems  Constitution: Negative for decreased appetite, fever and weight gain.  HENT: Negative for congestion, ear discharge, hoarse voice and sore throat.   Eyes: Negative for discharge, redness, vision loss in right eye and visual halos.  Cardiovascular: Negative for chest pain, dyspnea on exertion, leg swelling, orthopnea and palpitations.  Respiratory: Negative for cough, hemoptysis, shortness of breath and snoring.   Endocrine: Negative for heat intolerance and polyphagia.  Hematologic/Lymphatic: Negative for bleeding problem. Does not bruise/bleed easily.  Skin: Negative for flushing, nail changes, rash and suspicious lesions.  Musculoskeletal: Negative for arthritis, joint pain, muscle cramps, myalgias, neck pain and stiffness.  Gastrointestinal: Negative for abdominal pain, bowel incontinence, diarrhea and excessive appetite.  Genitourinary: Negative for decreased libido, genital sores and incomplete emptying.  Neurological: Negative for brief paralysis, focal weakness, headaches and loss of balance.  Psychiatric/Behavioral: Negative for altered mental status, depression and suicidal ideas.  Allergic/Immunologic: Negative for HIV exposure and persistent infections.    EKGs/Labs/Other Studies Reviewed:     The following studies were reviewed today:   EKG:  The ekg ordered today demonstrates sinus rhythm, heart rate 65 beats per minutes with no prior EKG for comparison.   Recent Labs:   No results found for requested labs within last 8760 hours.  Recent Lipid Panel No results found for: CHOL, TRIG, HDL, CHOLHDL, VLDL, LDLCALC, LDLDIRECT  Physical Exam:    VS:  BP 98/60 (BP Location: Left Arm, Patient Position: Sitting, Cuff Size: Normal)   Pulse 69   Ht 5' 6.5" (1.689 m)   Wt 184 lb (83.5 kg)   SpO2 94%   BMI 29.25 kg/m     Wt Readings from Last 3 Encounters:  10/03/19 184 lb (83.5 kg)     GEN: Well nourished, well developed in no acute distress HEENT: Normal NECK: No JVD; No carotid bruits LYMPHATICS: No lymphadenopathy CARDIAC: S1S2 noted,RRR, no murmurs, rubs, gallops RESPIRATORY:  Clear to auscultation without rales, wheezing or rhonchi  ABDOMEN: Soft, non-tender, non-distended, +bowel sounds, no guarding. EXTREMITIES: No edema, No cyanosis, no clubbing MUSCULOSKELETAL:  No deformity  SKIN: Warm and dry NEUROLOGIC:  Alert and oriented x 3, non-focal PSYCHIATRIC:  Normal affect, good insight  ASSESSMENT:    1. Paroxysmal atrial fibrillation (HCC)   2. Essential hypertension   3. Mixed hyperlipidemia    PLAN:     1. She is in sinus rhythm today.  We will continue the patient on her Cardizem as well as Eliquis today.  In the meantime an echocardiogram in the setting of her establishment of care as well as her atrial fibrillation for LV and RV function and for any valvular abnormalities.  2.  Hypertension-systolic blood pressure appears to be low in the office today.  For the patient tells me that she usually stays around where she is and denies any specific dizziness.  Spoken to keep her on her antihypertensive for now.  3.  Hyperlipidemia-continue her Crestor.   The patient is in agreement with the above plan. The patient left the office in stable condition.   The patient will follow up in 6 months sooner if needed.  Medication Adjustments/Labs and Tests Ordered: Current medicines are reviewed at length with the patient today.  Concerns regarding medicines are outlined above.  Orders Placed This Encounter  Procedures  . EKG 12-Lead  . ECHOCARDIOGRAM COMPLETE   No orders of the defined types were placed in this encounter.   Patient Instructions  Medication Instructions:  Your physician recommends that you continue on your current medications as directed. Please refer to the Current Medication list given to you today.  *If you need a refill on your cardiac medications before your next appointment, please call your pharmacy*   Lab Work: None.  If you have labs (blood work) drawn today and your tests are completely normal, you will receive your results only by: Marland Kitchen MyChart Message (if you have MyChart) OR . A paper copy in the mail If you have any lab test that is abnormal or we need to change your treatment, we will call you to review the results.   Testing/Procedures: Your physician has requested that you have an echocardiogram. Echocardiography is a painless test that uses sound waves to create images of your heart. It provides your doctor with information about the size and shape of your heart and how well your heart's chambers and valves are working. This procedure takes approximately one hour. There are no restrictions for this procedure.     Follow-Up: At [facility], you and your health needs are our priority.  As part of our continuing mission to provide you with exceptional heart care, we have created designated Provider Care Teams.  These Care Teams include your primary Cardiologist (physician) and Advanced Practice Providers (APPs -  Physician Assistants and Nurse Practitioners) who all work together to provide you with the care you need, when you need it.  We recommend signing up for the patient portal called "MyChart".  Sign  up information is provided on this After Visit Summary.  MyChart is used to connect with patients for Virtual Visits (Telemedicine).  Patients are able to view lab/test results, encounter notes, upcoming appointments, etc.  Non-urgent messages can be sent to your provider as well.   To learn more about what you can do with MyChart, go to [email or web address].    Your next appointment:   6 month(s)  The format for your next appointment:   In Person  Provider:  Berniece Salines, DO   Other Instructions   Echocardiogram An echocardiogram is a procedure that uses painless sound waves (ultrasound) to produce an image of the heart. Images from an echocardiogram can provide important information about:  Signs of coronary artery disease (CAD).  Aneurysm detection. An aneurysm is a weak or damaged part of an artery wall that bulges out from the normal force of blood pumping through the body.  Heart size and shape. Changes in the size or shape of the heart can be associated with certain conditions, including heart failure, aneurysm, and CAD.  Heart muscle function.  Heart valve function.  Signs of a past heart attack.  Fluid buildup around the heart.  Thickening of the heart muscle.  A tumor or infectious growth around the heart valves. Tell a health care provider about:  Any allergies you have.  All medicines you are taking, including vitamins, herbs, eye drops, creams, and over-the-counter medicines.  Any blood disorders you have.  Any surgeries you have had.  Any medical conditions you have.  Whether you are pregnant or may be pregnant. What are the risks? Generally, this is a safe procedure. However, problems may occur, including:  Allergic reaction to dye (contrast) that may be used during the procedure. What happens before the procedure? No specific preparation is needed. You may eat and drink normally. What happens during the procedure?   An IV tube may be  inserted into one of your veins.  You may receive contrast through this tube. A contrast is an injection that improves the quality of the pictures from your heart.  A gel will be applied to your chest.  A wand-like tool (transducer) will be moved over your chest. The gel will help to transmit the sound waves from the transducer.  The sound waves will harmlessly bounce off of your heart to allow the heart images to be captured in real-time motion. The images will be recorded on a computer. The procedure may vary among health care providers and hospitals. What happens after the procedure?  You may return to your normal, everyday life, including diet, activities, and medicines, unless your health care provider tells you not to do that. Summary  An echocardiogram is a procedure that uses painless sound waves (ultrasound) to produce an image of the heart.  Images from an echocardiogram can provide important information about the size and shape of your heart, heart muscle function, heart valve function, and fluid buildup around your heart.  You do not need to do anything to prepare before this procedure. You may eat and drink normally.  After the echocardiogram is completed, you may return to your normal, everyday life, unless your health care provider tells you not to do that. This information is not intended to replace advice given to you by your health care provider. Make sure you discuss any questions you have with your health care provider. Document Revised: 09/13/2018 Document Reviewed: 06/25/2016 Elsevier Patient Education  [place].     Adopting a Healthy Lifestyle.  Know what a healthy weight is for you (roughly BMI <25) and aim to maintain this   Aim for 7+ servings of fruits and vegetables daily   65-80+ fluid ounces of water or unsweet tea for healthy kidneys   Limit to max 1 drink of alcohol per day; avoid smoking/tobacco   Limit animal fats in diet for  cholesterol and heart health - choose grass fed whenever available   Avoid highly processed foods, and foods  high in saturated/trans fats   Aim for low stress - take time to unwind and care for your mental health   Aim for 150 min of moderate intensity exercise weekly for heart health, and weights twice weekly for bone health   Aim for 7-9 hours of sleep daily   When it comes to diets, agreement about the perfect plan isnt easy to find, even among the experts. Experts at the [employer] developed an idea known as the Healthy Eating Plate. Just imagine a plate divided into logical, healthy portions.   The emphasis is on diet quality:   Load up on vegetables and fruits - one-half of your plate: Aim for color and variety, and remember that potatoes dont count.   Go for whole grains - one-quarter of your plate: Whole wheat, barley, wheat berries, quinoa, oats, brown rice, and foods made with them. If you want pasta, go with whole wheat pasta.   Protein power - one-quarter of your plate: Fish, chicken, beans, and nuts are all healthy, versatile protein sources. Limit red meat.   The diet, however, does go beyond the plate, offering a few other suggestions.   Use healthy plant oils, such as olive, canola, soy, corn, sunflower and peanut. Check the labels, and avoid partially hydrogenated oil, which have unhealthy trans fats.   If youre thirsty, drink water. Coffee and tea are good in moderation, but skip sugary drinks and limit milk and dairy products to one or two daily servings.   The type of carbohydrate in the diet is more important than the amount. Some sources of carbohydrates, such as vegetables, fruits, whole grains, and beans-are healthier than others.   Finally, stay active  Signed, Berniece Salines, DO  10/03/2019 8:34 AM    [place]

## 2019-10-18 ENCOUNTER — Telehealth: Payer: Self-pay

## 2019-10-18 ENCOUNTER — Other Ambulatory Visit: Payer: Self-pay

## 2019-10-18 ENCOUNTER — Ambulatory Visit (INDEPENDENT_AMBULATORY_CARE_PROVIDER_SITE_OTHER): Payer: Medicare Other

## 2019-10-18 DIAGNOSIS — I480 Paroxysmal atrial fibrillation: Secondary | ICD-10-CM | POA: Diagnosis not present

## 2019-10-18 MED ORDER — PERFLUTREN LIPID MICROSPHERE: 1.0000 mL | INTRAVENOUS | 0 refills | Status: DC | PRN

## 2019-10-18 NOTE — Telephone Encounter (Signed)
-----   Message from Berniece Salines, DO sent at 10/18/2019 12:33 PM EDT ----- Please let the patient know that her EF is normal.  But the top of her heart is thickened than usual.  I will discuss this more with her at her upcoming appointment.

## 2019-10-18 NOTE — Telephone Encounter (Signed)
Left message on patients voicemail to please return our call.   

## 2019-10-18 NOTE — Progress Notes (Signed)
Complete echocardiogram performed.  Jimmy Parthena Fergeson RDCS, RVT  

## 2019-10-21 NOTE — Telephone Encounter (Signed)
Spoke with patient regarding results and recommendation.  Patient verbalizes understanding and is agreeable to plan of care. Advised patient to call back with any issues or concerns.  

## 2019-10-30 ENCOUNTER — Ambulatory Visit: Payer: Self-pay | Admitting: Cardiology

## 2019-11-11 ENCOUNTER — Other Ambulatory Visit: Payer: Self-pay

## 2019-11-11 ENCOUNTER — Ambulatory Visit: Payer: Medicare Other | Admitting: Cardiology

## 2019-11-11 ENCOUNTER — Ambulatory Visit (INDEPENDENT_AMBULATORY_CARE_PROVIDER_SITE_OTHER): Payer: Medicare Other | Admitting: Cardiology

## 2019-11-11 ENCOUNTER — Encounter: Payer: Self-pay | Admitting: Cardiology

## 2019-11-11 VITALS — BP 114/52 | HR 62 | Ht 66.5 in | Wt 176.0 lb

## 2019-11-11 DIAGNOSIS — I421 Obstructive hypertrophic cardiomyopathy: Secondary | ICD-10-CM | POA: Diagnosis not present

## 2019-11-11 DIAGNOSIS — I071 Rheumatic tricuspid insufficiency: Secondary | ICD-10-CM | POA: Diagnosis not present

## 2019-11-11 DIAGNOSIS — I480 Paroxysmal atrial fibrillation: Secondary | ICD-10-CM

## 2019-11-11 DIAGNOSIS — I10 Essential (primary) hypertension: Secondary | ICD-10-CM

## 2019-11-11 NOTE — Patient Instructions (Signed)

## 2019-11-11 NOTE — Progress Notes (Signed)
Cardiology Office Note:    Date:  11/11/2019   ID:  Monica Anderson, DOB 1946/03/25, MRN [id number]  PCP:  Serita Grammes, MD  Cardiologist:  Berniece Salines, DO  Electrophysiologist:  None   Referring MD: Myer Peer, MD   " I am doing well I feel a lot better"  History of Present Illness:    Monica Anderson is a 74 y.o. female with a hx of Paroxysmal atrial fibrillation diagnosed in 2015 on Cardizem and Eliquis, hypertension, hyperlipidemia, Mnire's disease, recently diagnosed BPPV .  I did see the patient on October 03, 2019 at that time she presented to reestablish cardiac care as she had not seen cardiology in several years.  At the conclusion her visit her blood pressure was low I was concerned, we discussed about changing her medication but the patient did tell me that was usually where her blood pressure stays and she was not symptomatic.  Therefore order echocardiogram to assess for LV and RV function as well as any valvular abnormalities.  In the interim the patient was able to get this testing done we called her prior to this visit to give her her echo report.  Today she tells me that she has been doing well she offers no complaints at this time.  Past Medical History:  Diagnosis Date  . Asymmetrical left sensorineural hearing loss 10/01/2019  . Atrial fibrillation ([place]) 10/02/2019  . Essential hypertension 10/03/2019  . History of appendectomy 10/02/2019  . History of atypical nevus 12/14/2016  . History of malignant melanoma of skin 06/16/2017  . 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, left ([place]) 01/17/2017  . Meniere's disease 08/29/2019  . Mixed hyperlipidemia 10/03/2019  . Tubal ligation evaluation 10/02/2019    Past Surgical History:  Procedure Laterality Date  . BREAST BIOPSY  1973  . CATARACT EXTRACTION, BILATERAL  2019  . MOHS SURGERY Left 2019   Melanoma on left ear  . TUBAL LIGATION  1977    Current Medications: Current Meds    Medication Sig  . B Complex-C-Folic Acid (SUPER B COMPLEX/FA/VIT C PO) Take by mouth daily.  . Calcium Carbonate-Vit D-Min (CALCIUM 1200 PO) Take 1,200 mg by mouth daily.  . cholecalciferol (VITAMIN D3) 25 MCG (1000 UNIT) tablet Take 2,000 Units by mouth daily.  Marland Kitchen diltiazem (CARDIZEM CD) 120 MG 24 hr capsule Take by mouth daily.  Marland Kitchen ELIQUIS 5 MG TABS tablet Take 5 mg by mouth 2 (two) times daily.  . Magnesium 500 MG CAPS Take 500 mg by mouth daily.  . Omega-3 Fatty Acids (FISH OIL) 1000 MG CAPS Take 1,000 mg by mouth 2 (two) times daily.  Marland Kitchen oxybutynin (DITROPAN-XL) 5 MG 24 hr tablet Take 5 mg by mouth daily.  . rosuvastatin (CRESTOR) 5 MG tablet Take 5 mg by mouth every evening.  Marland Kitchen spironolactone-hydrochlorothiazide (ALDACTAZIDE) 25-25 MG tablet Take 1 tablet by mouth daily.  Marland Kitchen telmisartan (MICARDIS) 80 MG tablet Take 80 mg by mouth daily.  Marland Kitchen tolterodine (DETROL LA) 4 MG 24 hr capsule Take 4 mg by mouth every evening.   Current Facility-Administered Medications for the 11/11/19 encounter (Office Visit) with Berniece Salines, DO  Medication  . triamcinolone acetonide (KENALOG-40) injection 20 mg     Allergies:   Patient has no known allergies.   Social History   Socioeconomic History  . Marital status: Married    Spouse name: Not on file  . Number of children: Not on file  . Years of education:  Not on file  . Highest education level: Not on file  Occupational History  . Not on file  Tobacco Use  . Smoking status: Never Smoker  . Smokeless tobacco: Never Used  Substance and Sexual Activity  . Alcohol use: Yes    Comment: Wine once every 2 months  . Drug use: Never  . Sexual activity: Not on file  Other Topics Concern  . Not on file  Social History Narrative  . Not on file   Social Determinants of Health   Financial Resource Strain:   . Difficulty of Paying Living Expenses:   Food Insecurity:   . Worried About Charity fundraiser in the Last Year:   . Arboriculturist in the  Last Year:   Transportation Needs:   . Film/video editor (Medical):   Marland Kitchen Lack of Transportation (Non-Medical):   Physical Activity:   . Days of Exercise per Week:   . Minutes of Exercise per Session:   Stress:   . Feeling of Stress :   Social Connections:   . Frequency of Communication with Friends and Family:   . Frequency of Social Gatherings with Friends and Family:   . Attends Religious Services:   . Active Member of Clubs or Organizations:   . Attends Archivist Meetings:   Marland Kitchen Marital Status:      Family History: The patient's family history includes Atrial fibrillation in her mother; Diabetes in her father; Hypertension in her father and mother; Stroke in her father.  ROS:   Review of Systems  Constitution: Negative for decreased appetite, fever and weight gain.  HENT: Negative for congestion, ear discharge, hoarse voice and sore throat.   Eyes: Negative for discharge, redness, vision loss in right eye and visual halos.  Cardiovascular: Negative for chest pain, dyspnea on exertion, leg swelling, orthopnea and palpitations.  Respiratory: Negative for cough, hemoptysis, shortness of breath and snoring.   Endocrine: Negative for heat intolerance and polyphagia.  Hematologic/Lymphatic: Negative for bleeding problem. Does not bruise/bleed easily.  Skin: Negative for flushing, nail changes, rash and suspicious lesions.  Musculoskeletal: Negative for arthritis, joint pain, muscle cramps, myalgias, neck pain and stiffness.  Gastrointestinal: Negative for abdominal pain, bowel incontinence, diarrhea and excessive appetite.  Genitourinary: Negative for decreased libido, genital sores and incomplete emptying.  Neurological: Negative for brief paralysis, focal weakness, headaches and loss of balance.  Psychiatric/Behavioral: Negative for altered mental status, depression and suicidal ideas.  Allergic/Immunologic: Negative for HIV exposure and persistent infections.     EKGs/Labs/Other Studies Reviewed:    The following studies were reviewed today:   EKG: None today   TTE Impression 1. Left ventricular ejection fraction, by estimation, is 60 to 65%. The left ventricle has normal function. The left ventricle has no regional  wall motion abnormalities. There is moderate concentric left ventricular hypertrophy. There is concern for  apical variant of hypertrophic cardiomyopathy, with post definity images also suggestive of Apical variant of HCM. Further testing with cardiac MRI is recommended.  2. Left ventricular diastolic parameters are consistent with Grade I diastolic dysfunction (impaired relaxation).  3. Right ventricular systolic function is normal. The right ventricular size is normal. There is normal pulmonary artery systolic pressure.  4. The mitral valve is normal in structure. No evidence of mitral valve regurgitation. No evidence of mitral stenosis.  5. Tricuspid valve regurgitation is moderate.  6. The aortic valve is normal in structure. Aortic valve regurgitation is  not visualized. No aortic  stenosis is present.  7. The inferior vena cava is normal in size with greater than 50%  respiratory variability, suggesting right atrial pressure of 3 mmHg.   Recent Labs: No results found for requested labs within last 8760 hours.  Recent Lipid Panel No results found for: CHOL, TRIG, HDL, CHOLHDL, VLDL, LDLCALC, LDLDIRECT  Physical Exam:    VS:  BP (!) 114/52   Pulse 62   Ht 5' 6.5" (1.689 m)   Wt 176 lb (79.8 kg)   SpO2 98%   BMI 27.98 kg/m     Wt Readings from Last 3 Encounters:  11/11/19 176 lb (79.8 kg)  10/03/19 184 lb (83.5 kg)     GEN: Well nourished, well developed in no acute distress HEENT: Normal NECK: No JVD; No carotid bruits LYMPHATICS: No lymphadenopathy CARDIAC: S1S2 noted,RRR, no murmurs, rubs, gallops RESPIRATORY:  Clear to auscultation without rales, wheezing or rhonchi  ABDOMEN: Soft, non-tender,  non-distended, +bowel sounds, no guarding. EXTREMITIES: No edema, No cyanosis, no clubbing MUSCULOSKELETAL:  No deformity  SKIN: Warm and dry NEUROLOGIC:  Alert and oriented x 3, non-focal PSYCHIATRIC:  Normal affect, good insight  ASSESSMENT:    1. Hypertrophic cardiomyopathy ([place])   2. Moderate tricuspid regurgitation   3. Essential hypertension   4. PAF (paroxysmal atrial fibrillation) ([facility])    PLAN:    Her echocardiogram is concerning for Apical variant HCM, she needs a Cardiac MRI to further assess this.  I did discuss these findings with the patient.  We will proceed with a cardiac MRI. Her questions were answered.  Moderate TR - she is compensated with no evidence of right heart failure - continue her current medication regimen.   Hypertension - her blood pressure is acceptable at this time.   PAF - continue patient her current Eliquis and cardizem.   The patient is in agreement with the above plan. The patient left the office in stable condition.  The patient will follow up in 6 months or sooner if needed.   Medication Adjustments/Labs and Tests Ordered: Current medicines are reviewed at length with the patient today.  Concerns regarding medicines are outlined above.  No orders of the defined types were placed in this encounter.  No orders of the defined types were placed in this encounter.   Patient Instructions  Medication Instructions:  Your physician recommends that you continue on your current medications as directed. Please refer to the Current Medication list given to you today.  *If you need a refill on your cardiac medications before your next appointment, please call your pharmacy*   Lab Work: None.  If you have labs (blood work) drawn today and your tests are completely normal, you will receive your results only by: Marland Kitchen MyChart Message (if you have MyChart) OR . A paper copy in the mail If you have any lab test that is abnormal or we need to change  your treatment, we will call you to review the results.   Testing/Procedures: None.    Follow-Up: At [facility], you and your health needs are our priority.  As part of our continuing mission to provide you with exceptional heart care, we have created designated Provider Care Teams.  These Care Teams include your primary Cardiologist (physician) and Advanced Practice Providers (APPs -  Physician Assistants and Nurse Practitioners) who all work together to provide you with the care you need, when you need it.  We recommend signing up for the patient portal called "MyChart".  Sign up  information is provided on this After Visit Summary.  MyChart is used to connect with patients for Virtual Visits (Telemedicine).  Patients are able to view lab/test results, encounter notes, upcoming appointments, etc.  Non-urgent messages can be sent to your provider as well.   To learn more about what you can do with MyChart, go to [email or web address].    Your next appointment:   6 month(s)  The format for your next appointment:   In Person  Provider:   Berniece Salines, DO   Other Instructions     Adopting a Healthy Lifestyle.  Know what a healthy weight is for you (roughly BMI <25) and aim to maintain this   Aim for 7+ servings of fruits and vegetables daily   65-80+ fluid ounces of water or unsweet tea for healthy kidneys   Limit to max 1 drink of alcohol per day; avoid smoking/tobacco   Limit animal fats in diet for cholesterol and heart health - choose grass fed whenever available   Avoid highly processed foods, and foods high in saturated/trans fats   Aim for low stress - take time to unwind and care for your mental health   Aim for 150 min of moderate intensity exercise weekly for heart health, and weights twice weekly for bone health   Aim for 7-9 hours of sleep daily   When it comes to diets, agreement about the perfect plan isnt easy to find, even among the experts. Experts  at the [place] developed an idea known as the Healthy Eating Plate. Just imagine a plate divided into logical, healthy portions.   The emphasis is on diet quality:   Load up on vegetables and fruits - one-half of your plate: Aim for color and variety, and remember that potatoes dont count.   Go for whole grains - one-quarter of your plate: Whole wheat, barley, wheat berries, quinoa, oats, brown rice, and foods made with them. If you want pasta, go with whole wheat pasta.   Protein power - one-quarter of your plate: Fish, chicken, beans, and nuts are all healthy, versatile protein sources. Limit red meat.   The diet, however, does go beyond the plate, offering a few other suggestions.   Use healthy plant oils, such as olive, canola, soy, corn, sunflower and peanut. Check the labels, and avoid partially hydrogenated oil, which have unhealthy trans fats.   If youre thirsty, drink water. Coffee and tea are good in moderation, but skip sugary drinks and limit milk and dairy products to one or two daily servings.   The type of carbohydrate in the diet is more important than the amount. Some sources of carbohydrates, such as vegetables, fruits, whole grains, and beans-are healthier than others.   Finally, stay active  Signed, Berniece Salines, DO  11/11/2019 9:53 PM    [facility]

## 2019-11-12 ENCOUNTER — Telehealth: Payer: Self-pay | Admitting: Emergency Medicine

## 2019-11-12 NOTE — Telephone Encounter (Signed)
Per Dr. Harriet Masson patient needs a cardiac mri. Left message for her to return call to verify if she has implanted devices before ordering.   Patient called back and informed me she does not need sedation and she has no implanted devices.

## 2019-11-12 NOTE — Telephone Encounter (Signed)
Pt called back returning Hayley's call

## 2019-11-22 ENCOUNTER — Encounter: Payer: Self-pay | Admitting: *Deleted

## 2019-11-22 ENCOUNTER — Telehealth: Payer: Self-pay | Admitting: *Deleted

## 2019-11-22 NOTE — Telephone Encounter (Signed)
Spoke with patient regarding appointment for Cardiac MRI scheduled 12/25/19 at 8:00 am at [facility]---arrival time is 7:30 am 1st floor admissions office.  Will mail information to patient---she voiced her understanding.

## 2019-12-12 DIAGNOSIS — M65332 Trigger finger, left middle finger: Secondary | ICD-10-CM | POA: Insufficient documentation

## 2019-12-24 ENCOUNTER — Telehealth (HOSPITAL_COMMUNITY): Payer: Self-pay | Admitting: *Deleted

## 2019-12-24 NOTE — Telephone Encounter (Signed)
Reaching out to patient to offer assistance regarding upcoming cardiac imaging study; pt verbalizes understanding of appt date/time, parking situation and where to check in, and verified current allergies; name and call back number provided for further questions should they arise ° °Zanasia Hickson Tai RN Navigator Cardiac Imaging °[facility] °[phone] office °[phone] cell ° °

## 2019-12-25 ENCOUNTER — Other Ambulatory Visit: Payer: Self-pay

## 2019-12-25 ENCOUNTER — Ambulatory Visit (HOSPITAL_COMMUNITY)
Admission: RE | Admit: 2019-12-25 | Discharge: 2019-12-25 | Disposition: A | Discharge status: Home / Self Care | Payer: Medicare Other | Source: Ambulatory Visit | Attending: Cardiology | Admitting: Cardiology

## 2019-12-25 DIAGNOSIS — I421 Obstructive hypertrophic cardiomyopathy: Secondary | ICD-10-CM | POA: Insufficient documentation

## 2019-12-25 DIAGNOSIS — I422 Other hypertrophic cardiomyopathy: Secondary | ICD-10-CM

## 2019-12-25 MED ORDER — GADOBUTROL 1 MMOL/ML IV SOLN
10.0000 mL | Freq: Once | INTRAVENOUS | Status: AC | PRN
  Administered 2019-12-25: 10 mL via INTRAVENOUS

## 2019-12-27 ENCOUNTER — Telehealth: Payer: Self-pay

## 2019-12-27 NOTE — Telephone Encounter (Signed)
-----   Message from Berniece Salines, DO sent at 12/27/2019 12:13 PM EDT ----- Please let the patient know that the MRI confirm my suspicion for the diagnosis of apical hypertrophic cardiomyopathy.Talk to her little bit on the phone about this.  But we can move up her visit to August because I wanted go through different information and more education about this diagnosis.

## 2019-12-27 NOTE — Telephone Encounter (Signed)
Tried calling patient. No answer and no voicemail set up for me to leave a message. 

## 2020-01-07 ENCOUNTER — Other Ambulatory Visit: Payer: Self-pay

## 2020-01-07 ENCOUNTER — Ambulatory Visit (INDEPENDENT_AMBULATORY_CARE_PROVIDER_SITE_OTHER): Payer: Medicare Other | Admitting: Cardiology

## 2020-01-07 ENCOUNTER — Ambulatory Visit (INDEPENDENT_AMBULATORY_CARE_PROVIDER_SITE_OTHER): Payer: Medicare Other

## 2020-01-07 ENCOUNTER — Encounter: Payer: Self-pay | Admitting: Cardiology

## 2020-01-07 VITALS — BP 106/56 | HR 65 | Ht 66.0 in | Wt 173.0 lb

## 2020-01-07 DIAGNOSIS — I10 Essential (primary) hypertension: Secondary | ICD-10-CM | POA: Diagnosis not present

## 2020-01-07 DIAGNOSIS — E782 Mixed hyperlipidemia: Secondary | ICD-10-CM

## 2020-01-07 DIAGNOSIS — R42 Dizziness and giddiness: Secondary | ICD-10-CM | POA: Diagnosis not present

## 2020-01-07 DIAGNOSIS — I422 Other hypertrophic cardiomyopathy: Secondary | ICD-10-CM | POA: Diagnosis not present

## 2020-01-07 NOTE — Patient Instructions (Addendum)
Medication Instructions:  Stop Telmisartan ( [place])  *If you need a refill on your cardiac medications before your next appointment, please call your pharmacy*  Lab Work: None ordered   If you have labs (blood work) drawn today and your tests are completely normal, you will receive your results only by: Marland Kitchen MyChart Message (if you have MyChart) OR . A paper copy in the mail If you have any lab test that is abnormal or we need to change your treatment, we will call you to review the results.   Testing/Procedures: A zio monitor was ordered today. It will remain on for 14 days. You will then return monitor and event diary in provided box. It takes 1-2 weeks for report to be downloaded and returned to [place]. We will call you with the results. If monitor falls off or has orange flashing light, please call Zio for further instructions.    Follow-Up: At [facility], you and your health needs are our priority.  As part of our continuing mission to provide you with exceptional heart care, we have created designated Provider Care Teams.  These Care Teams include your primary Cardiologist (physician) and Advanced Practice Providers (APPs -  Physician Assistants and Nurse Practitioners) who all work together to provide you with the care you need, when you need it.  We recommend signing up for the patient portal called "MyChart".  Sign up information is provided on this After Visit Summary.  MyChart is used to connect with patients for Virtual Visits (Telemedicine).  Patients are able to view lab/test results, encounter notes, upcoming appointments, etc.  Non-urgent messages can be sent to your provider as well.   To learn more about what you can do with MyChart, go to [email or web address].    Your next appointment:   2 month(s)  The format for your next appointment:   In Person  Provider:   Berniece Salines, DO   Other Instructions Call our office if the top number of your blood pressure is  consistently running above 150    Hypertrophic Cardiomyopathy  Hypertrophic cardiomyopathy (HCM) is a heart condition in which part of the heart muscle gets too thick. The condition can cause a dangerous and abnormal heart rhythm. It can also weaken the heart over time. The heart is divided into four chambers. The thickening usually affects the pumping chamber on the lower left (left ventricle). HCM may also affect the valve that lets blood flow into the left ventricle (mitral valve). Symptoms often begin when a person is about 75 years old. What are the causes? This condition is usually caused by abnormal genes that control heart muscle growth. These abnormal genes are passed down through families (are inherited). What increases the risk? You are more likely to develop this condition if you have a family history of HCM. What are the signs or symptoms? Symptoms of this condition include:  Shortness of breath, especially after exercising or lying down.  Chest pain.  Dizziness.  Fatigue.  Irregular or fast heart rate.  Fainting, especially after physical activity. How is this diagnosed? This condition is diagnosed based on:  A physical exam that involves checking for an abnormal heart sound (heart murmur).  Tests, such as: ? An electrocardiogram (ECG). This is a test that records your heart's electrical activity. ? An echocardiogram. This test can show whether your left ventricle is enlarged and whether it fills slowly. ? An exercise stress test. This test is done to collect information about how your heart  functions during exercise. ? A Doppler test. This is a test that shows irregular blood flow and pressure differences inside the heart. ? A chest X-ray to see whether your heart is enlarged. ? An MRI. ? Genetic testing done on a blood sample. How is this treated? Treatment for HCM depends on how severe your symptoms are. There are several options for treatment,  including:  Medicine. Medicines can be given to: ? Reduce the workload of your heart. ? Lower your blood pressure. ? Thin your blood and prevent clots.  A device. Devices that can be used to treat this condition include: ? A pacemaker. This device helps to control your heartbeat. ? A defibrillator. This device restores a normal heart rhythm.  Surgery. This may include a procedure to: ? Inject alcohol into the small blood vessels that supply your heart muscle (alcohol septal ablation).This is done during a procedure called cardiac catheterization. The goal is to cause the muscle to become thinner. ? Remove part of the wall that divides the right and left sides of the heart (septum) with a procedure called surgical myectomy. ? Replace the mitral valve. Follow these instructions at home: Activity  Avoid strenuous exercise and activities, such as shoveling snow. Do not participate in high intensity activities, such as competitive sports.  Get regular physical activity. Most patients can participate in low to moderate intensity exercise such as walking. Ask your health care provider what activity level is safe for you.  Do not lift anything that is heavier than 10 lb (4.5 kg), or the limit that you are told, until your health care provider says that it is safe. Lifestyle  Maintain a healthy weight. If you need help with losing weight, ask your health care provider.  Eat a heart-healthy diet.  Do not drink alcohol if: ? Your health care provider tells you not to drink. ? You are pregnant, may be pregnant, or are planning to become pregnant.  If you drink alcohol: ? Limit how much you use to:  0-1 drink a day for women.  0-2 drinks a day for men. ? Be aware of how much alcohol is in your drink. In the [place] one drink equals one 12 oz bottle of beer (355 mL), one 5 oz glass of wine (148 mL), or one 1 oz glass of hard liquor (44 mL).  Do not use any products that contain nicotine or  tobacco, such as cigarettes, e-cigarettes, and chewing tobacco. If you need help quitting, ask your health care provider. General instructions  Make sure the members of your household know how to do CPR in case of an emergency.  Take over-the-counter and prescription medicines only as told by your health care provider.  Keep all follow-up visits as told by your health care provider. This is important. Contact a health care provider if:  You have new symptoms.  Your symptoms get worse. Get help right away if:  You have chest pain or shortness of breath, especially during or after sports.  You feel faint or you pass out.  You have trouble breathing even at rest.  Your feet or ankles swell.  Your heartbeat seems irregular or seems faster than normal (palpitations). These symptoms may represent a serious problem that is an emergency. Do not wait to see if the symptoms will go away. Get medical help right away. Call your local emergency services (911 in the [place] Do not drive yourself to the hospital. Summary  Hypertrophic cardiomyopathy (HCM) is a heart  condition in which part of the heart muscle gets too thick.  The condition can cause a dangerous and abnormal heart rhythm, and it can weaken the heart over time.  Avoid strenuous exercise and activities, such as shoveling snow.  Make sure the members of your household know how to do CPR in case of an emergency.  Keep all follow-up visits as told by your health care provider. This is important. This information is not intended to replace advice given to you by your health care provider. Make sure you discuss any questions you have with your health care provider. Document Revised: 02/13/2018 Document Reviewed: 02/13/2018 Elsevier Patient Education  [place].

## 2020-01-07 NOTE — Progress Notes (Signed)
Cardiology Office Note:    Date:  01/07/2020   ID:  Monica Anderson, DOB 1946/02/06, MRN [id number]  PCP:  Serita Grammes, MD  Cardiologist:  Berniece Salines, DO  Electrophysiologist:  None   Referring MD: Serita Grammes, MD   " I am doing ok"  History of Present Illness:    Monica Anderson a 74 y.o.femalewith a hx of Paroxysmalatrial fibrillation diagnosed in 2015 on Cardizem and Eliquis, hypertension, hyperlipidemia, Mnire's disease, recently diagnosed BPPV .  I last saw the patient on November 11, 2019 at that time we discussed the need for cardiac MRI for diagnosis of apical variant hypertrophic cardiomyopathy.  She also did have moderate tricuspid regurgitation which was explained to the patient.  She was able to get her MRI for confirmation of diagnosis and understanding of late gadolinium enhancement and is here today to discuss her test.  Today she tells me that she has been experiencing intermittent dizziness which is worsening. No chest pain, however there is intermittent shortness of breath.   Past Medical History:  Diagnosis Date  . Asymmetrical left sensorineural hearing loss 10/01/2019  . Atrial fibrillation ([place]) 10/02/2019  . Essential hypertension 10/03/2019  . History of appendectomy 10/02/2019  . History of atypical nevus 12/14/2016  . History of malignant melanoma of skin 06/16/2017  . 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, left ([place]) 01/17/2017  . Meniere's disease 08/29/2019  . Mixed hyperlipidemia 10/03/2019  . Tubal ligation evaluation 10/02/2019    Past Surgical History:  Procedure Laterality Date  . BREAST BIOPSY  1973  . CATARACT EXTRACTION, BILATERAL  2019  . MOHS SURGERY Left 2019   Melanoma on left ear  . TUBAL LIGATION  1977    Current Medications: Current Meds  Medication Sig  . B Complex-C-Folic Acid (HM SUPER VITAMIN B COMPLEX/C) TABS Take 1 tablet by mouth daily.  Marland Kitchen buPROPion (WELLBUTRIN XL) 150 MG 24 hr tablet Take  1 tablet by mouth daily.  . Calcium Carbonate-Vit D-Min (CALCIUM 1200 PO) Take 1,200 mg by mouth daily.  . cholecalciferol (VITAMIN D3) 25 MCG (1000 UNIT) tablet Take 2,000 Units by mouth daily.  Marland Kitchen diltiazem (CARDIZEM CD) 120 MG 24 hr capsule Take by mouth daily.  Marland Kitchen ELIQUIS 5 MG TABS tablet Take 5 mg by mouth 2 (two) times daily.  Marland Kitchen ketoconazole (NIZORAL) 2 % cream ketoconazole 2 % topical cream  APPLY TO RASH IN SKIN FOLDS 2 TIMES DAILY X 14 DAYS AS NEEDED  . Omega-3 Fatty Acids (FISH OIL) 1000 MG CAPS Take 1,000 mg by mouth 2 (two) times daily.  Marland Kitchen oxybutynin (DITROPAN-XL) 5 MG 24 hr tablet Take 5 mg by mouth daily.  . rosuvastatin (CRESTOR) 5 MG tablet Take 5 mg by mouth every evening.  Marland Kitchen spironolactone-hydrochlorothiazide (ALDACTAZIDE) 25-25 MG tablet Take 0.5 tablets by mouth daily.   . [DISCONTINUED] telmisartan (MICARDIS) 80 MG tablet Take 80 mg by mouth daily.   Current Facility-Administered Medications for the 01/07/20 encounter (Office Visit) with Berniece Salines, DO  Medication  . triamcinolone acetonide (KENALOG-40) injection 20 mg     Allergies:   Patient has no known allergies.   Social History   Socioeconomic History  . Marital status: Married    Spouse name: Not on file  . Number of children: Not on file  . Years of education: Not on file  . Highest education level: Not on file  Occupational History  . Not on file  Tobacco Use  . Smoking status: Never  Smoker  . Smokeless tobacco: Never Used  Substance and Sexual Activity  . Alcohol use: Yes    Comment: Wine once every 2 months  . Drug use: Never  . Sexual activity: Not on file  Other Topics Concern  . Not on file  Social History Narrative  . Not on file   Social Determinants of Health   Financial Resource Strain:   . Difficulty of Paying Living Expenses:   Food Insecurity:   . Worried About Charity fundraiser in the Last Year:   . Arboriculturist in the Last Year:   Transportation Needs:   . Lexicographer (Medical):   Marland Kitchen Lack of Transportation (Non-Medical):   Physical Activity:   . Days of Exercise per Week:   . Minutes of Exercise per Session:   Stress:   . Feeling of Stress :   Social Connections:   . Frequency of Communication with Friends and Family:   . Frequency of Social Gatherings with Friends and Family:   . Attends Religious Services:   . Active Member of Clubs or Organizations:   . Attends Archivist Meetings:   Marland Kitchen Marital Status:      Family History: The patient's family history includes Atrial fibrillation in her mother; Diabetes in her father; Hypertension in her father and mother; Stroke in her father.  ROS:   Review of Systems  Constitution: Negative for decreased appetite, fever and weight gain.  HENT: Negative for congestion, ear discharge, hoarse voice and sore throat.   Eyes: Negative for discharge, redness, vision loss in right eye and visual halos.  Cardiovascular: Negative for chest pain, dyspnea on exertion, leg swelling, orthopnea and palpitations.  Respiratory: Negative for cough, hemoptysis, shortness of breath and snoring.   Endocrine: Negative for heat intolerance and polyphagia.  Hematologic/Lymphatic: Negative for bleeding problem. Does not bruise/bleed easily.  Skin: Negative for flushing, nail changes, rash and suspicious lesions.  Musculoskeletal: Negative for arthritis, joint pain, muscle cramps, myalgias, neck pain and stiffness.  Gastrointestinal: Negative for abdominal pain, bowel incontinence, diarrhea and excessive appetite.  Genitourinary: Negative for decreased libido, genital sores and incomplete emptying.  Neurological: Negative for brief paralysis, focal weakness, headaches and loss of balance.  Psychiatric/Behavioral: Negative for altered mental status, depression and suicidal ideas.  Allergic/Immunologic: Negative for HIV exposure and persistent infections.    EKGs/Labs/Other Studies Reviewed:    The  following studies were reviewed today:   EKG:  The ekg ordered today demonstrates   TTE IMPRESSION: 1. Left ventricular ejection fraction, by estimation, is 60 to 65%. The left ventricle has normal function. The left ventricle has no regional  wall motion abnormalities. There is moderate concentric left ventricular hypertrophy. There is concern for apical variant of hypertrophic cardiomyopathy, with post definity images also suggestive of Apical variant of HCM. Further testing with cardiac MRI is recommended.  2. Left ventricular diastolic parameters are consistent with Grade I diastolic dysfunction (impaired relaxation).  3. Right ventricular systolic function is normal. The right ventricular size is normal. There is normal pulmonary artery systolic pressure.  4. The mitral valve is normal in structure. No evidence of mitral valve regurgitation. No evidence of mitral stenosis.  5. Tricuspid valve regurgitation is moderate.  6. The aortic valve is normal in structure. Aortic valve regurgitation is not visualized. No aortic stenosis is present.  7. The inferior vena cava is normal in size with greater than 50% respiratory variability, suggesting right atrial pressure of 3  mmHg.   Cardiac MR IMPRESSION: 1. LV apical hypertrophy measuring up to 104mm (basal posterior wall measures 67mm), consistent with apical hypertrophic cardiomyopathy.  2.  No late gadolinium enhancement to suggest myocardial scar.  3.  Normal LV size with hyperdynamic systolic function (EF 51%).  4.  Normal RV size and systolic function (EF 76%).  5. Small pericardial effusion measures up to 19mm adjacent to LV inferior wall.   Recent Labs: No results found for requested labs within last 8760 hours.  Recent Lipid Panel No results found for: CHOL, TRIG, HDL, CHOLHDL, VLDL, LDLCALC, LDLDIRECT  Physical Exam:    VS:  BP (!) 106/56   Pulse 65   Ht 5\' 6"  (1.676 m)   Wt 173 lb (78.5 kg)   SpO2 95%   BMI  27.92 kg/m     Wt Readings from Last 3 Encounters:  01/07/20 173 lb (78.5 kg)  11/11/19 176 lb (79.8 kg)  10/03/19 184 lb (83.5 kg)     GEN: Well nourished, well developed in no acute distress HEENT: Normal NECK: No JVD; No carotid bruits LYMPHATICS: No lymphadenopathy CARDIAC: S1S2 noted,RRR, no murmurs, rubs, gallops RESPIRATORY:  Clear to auscultation without rales, wheezing or rhonchi  ABDOMEN: Soft, non-tender, non-distended, +bowel sounds, no guarding. EXTREMITIES: No edema, No cyanosis, no clubbing MUSCULOSKELETAL:  No deformity  SKIN: Warm and dry NEUROLOGIC:  Alert and oriented x 3, non-focal PSYCHIATRIC:  Normal affect, good insight  ASSESSMENT:    1. Dizziness   2. Essential hypertension   3. Mixed hyperlipidemia   4. Apical variant hypertrophic cardiomyopathy ([place])    PLAN:    I was able to discuss again her diagnosis of Apical variant hypertrophy cardiomyopathy.  I explained to the patient in details about this diagnosis.  Since she is having intermittent shortness of breath I am going to take away her ACE inhibitor given that her blood pressure is also lower.  She is on Cardizem we will keep her on this medication for now as well as HCTZ and Aldactone.  If her symptoms persist off the Micardis my plan will be to take a wait-and-see Aldactone and place the patient on a beta-blocker.  Thankfully her MRI does not show any late gadolinium enhancement.  There is no apical aneurysm.  In terms of her dizziness despite the fact that she does have Mnire disease I will place a monitor on the patient is to make sure I am ruling out any life-threatening arrhythmias.  I have also asked the patient to notify her adult children about this diagnosis and the fact that they may need to be screened.  All of her questions have been answered today.  I will see the patient back in 2 months in follow-up based on the medication change.  The patient is in agreement with the above  plan. The patient left the office in stable condition.  The patient will follow up in 2 months or sooner if needed.   Medication Adjustments/Labs and Tests Ordered: Current medicines are reviewed at length with the patient today.  Concerns regarding medicines are outlined above.  Orders Placed This Encounter  Procedures  . LONG TERM MONITOR (3-14 DAYS)   No orders of the defined types were placed in this encounter.   Patient Instructions  Medication Instructions:  Stop Telmisartan ( Micardis)  *If you need a refill on your cardiac medications before your next appointment, please call your pharmacy*  Lab Work: None ordered   If you have labs (  blood work) drawn today and your tests are completely normal, you will receive your results only by: Marland Kitchen MyChart Message (if you have MyChart) OR . A paper copy in the mail If you have any lab test that is abnormal or we need to change your treatment, we will call you to review the results.   Testing/Procedures: A zio monitor was ordered today. It will remain on for 14 days. You will then return monitor and event diary in provided box. It takes 1-2 weeks for report to be downloaded and returned to [place]. We will call you with the results. If monitor falls off or has orange flashing light, please call Zio for further instructions.    Follow-Up: At [facility], you and your health needs are our priority.  As part of our continuing mission to provide you with exceptional heart care, we have created designated Provider Care Teams.  These Care Teams include your primary Cardiologist (physician) and Advanced Practice Providers (APPs -  Physician Assistants and Nurse Practitioners) who all work together to provide you with the care you need, when you need it.  We recommend signing up for the patient portal called "MyChart".  Sign up information is provided on this After Visit Summary.  MyChart is used to connect with patients for Virtual Visits  (Telemedicine).  Patients are able to view lab/test results, encounter notes, upcoming appointments, etc.  Non-urgent messages can be sent to your provider as well.   To learn more about what you can do with MyChart, go to [email or web address].    Your next appointment:   2 month(s)  The format for your next appointment:   In Person  Provider:   Berniece Salines, DO   Other Instructions Call our office if the top number of your blood pressure is consistently running above 150    Hypertrophic Cardiomyopathy  Hypertrophic cardiomyopathy (HCM) is a heart condition in which part of the heart muscle gets too thick. The condition can cause a dangerous and abnormal heart rhythm. It can also weaken the heart over time. The heart is divided into four chambers. The thickening usually affects the pumping chamber on the lower left (left ventricle). HCM may also affect the valve that lets blood flow into the left ventricle (mitral valve). Symptoms often begin when a person is about 75 years old. What are the causes? This condition is usually caused by abnormal genes that control heart muscle growth. These abnormal genes are passed down through families (are inherited). What increases the risk? You are more likely to develop this condition if you have a family history of HCM. What are the signs or symptoms? Symptoms of this condition include:  Shortness of breath, especially after exercising or lying down.  Chest pain.  Dizziness.  Fatigue.  Irregular or fast heart rate.  Fainting, especially after physical activity. How is this diagnosed? This condition is diagnosed based on:  A physical exam that involves checking for an abnormal heart sound (heart murmur).  Tests, such as: ? An electrocardiogram (ECG). This is a test that records your heart's electrical activity. ? An echocardiogram. This test can show whether your left ventricle is enlarged and whether it fills slowly. ? An  exercise stress test. This test is done to collect information about how your heart functions during exercise. ? A Doppler test. This is a test that shows irregular blood flow and pressure differences inside the heart. ? A chest X-ray to see whether your heart is enlarged. ? An  MRI. ? Genetic testing done on a blood sample. How is this treated? Treatment for HCM depends on how severe your symptoms are. There are several options for treatment, including:  Medicine. Medicines can be given to: ? Reduce the workload of your heart. ? Lower your blood pressure. ? Thin your blood and prevent clots.  A device. Devices that can be used to treat this condition include: ? A pacemaker. This device helps to control your heartbeat. ? A defibrillator. This device restores a normal heart rhythm.  Surgery. This may include a procedure to: ? Inject alcohol into the small blood vessels that supply your heart muscle (alcohol septal ablation).This is done during a procedure called cardiac catheterization. The goal is to cause the muscle to become thinner. ? Remove part of the wall that divides the right and left sides of the heart (septum) with a procedure called surgical myectomy. ? Replace the mitral valve. Follow these instructions at home: Activity  Avoid strenuous exercise and activities, such as shoveling snow. Do not participate in high intensity activities, such as competitive sports.  Get regular physical activity. Most patients can participate in low to moderate intensity exercise such as walking. Ask your health care provider what activity level is safe for you.  Do not lift anything that is heavier than 10 lb (4.5 kg), or the limit that you are told, until your health care provider says that it is safe. Lifestyle  Maintain a healthy weight. If you need help with losing weight, ask your health care provider.  Eat a heart-healthy diet.  Do not drink alcohol if: ? Your health care provider  tells you not to drink. ? You are pregnant, may be pregnant, or are planning to become pregnant.  If you drink alcohol: ? Limit how much you use to:  0-1 drink a day for women.  0-2 drinks a day for men. ? Be aware of how much alcohol is in your drink. In the [place] one drink equals one 12 oz bottle of beer (355 mL), one 5 oz glass of wine (148 mL), or one 1 oz glass of hard liquor (44 mL).  Do not use any products that contain nicotine or tobacco, such as cigarettes, e-cigarettes, and chewing tobacco. If you need help quitting, ask your health care provider. General instructions  Make sure the members of your household know how to do CPR in case of an emergency.  Take over-the-counter and prescription medicines only as told by your health care provider.  Keep all follow-up visits as told by your health care provider. This is important. Contact a health care provider if:  You have new symptoms.  Your symptoms get worse. Get help right away if:  You have chest pain or shortness of breath, especially during or after sports.  You feel faint or you pass out.  You have trouble breathing even at rest.  Your feet or ankles swell.  Your heartbeat seems irregular or seems faster than normal (palpitations). These symptoms may represent a serious problem that is an emergency. Do not wait to see if the symptoms will go away. Get medical help right away. Call your local emergency services (911 in the [place] Do not drive yourself to the hospital. Summary  Hypertrophic cardiomyopathy (HCM) is a heart condition in which part of the heart muscle gets too thick.  The condition can cause a dangerous and abnormal heart rhythm, and it can weaken the heart over time.  Avoid strenuous exercise and activities,  such as shoveling snow.  Make sure the members of your household know how to do CPR in case of an emergency.  Keep all follow-up visits as told by your health care provider. This is  important. This information is not intended to replace advice given to you by your health care provider. Make sure you discuss any questions you have with your health care provider. Document Revised: 02/13/2018 Document Reviewed: 02/13/2018 Elsevier Patient Education  [place].        Adopting a Healthy Lifestyle.  Know what a healthy weight is for you (roughly BMI <25) and aim to maintain this   Aim for 7+ servings of fruits and vegetables daily   65-80+ fluid ounces of water or unsweet tea for healthy kidneys   Limit to max 1 drink of alcohol per day; avoid smoking/tobacco   Limit animal fats in diet for cholesterol and heart health - choose grass fed whenever available   Avoid highly processed foods, and foods high in saturated/trans fats   Aim for low stress - take time to unwind and care for your mental health   Aim for 150 min of moderate intensity exercise weekly for heart health, and weights twice weekly for bone health   Aim for 7-9 hours of sleep daily   When it comes to diets, agreement about the perfect plan isnt easy to find, even among the experts. Experts at the [employer] developed an idea known as the Healthy Eating Plate. Just imagine a plate divided into logical, healthy portions.   The emphasis is on diet quality:   Load up on vegetables and fruits - one-half of your plate: Aim for color and variety, and remember that potatoes dont count.   Go for whole grains - one-quarter of your plate: Whole wheat, barley, wheat berries, quinoa, oats, brown rice, and foods made with them. If you want pasta, go with whole wheat pasta.   Protein power - one-quarter of your plate: Fish, chicken, beans, and nuts are all healthy, versatile protein sources. Limit red meat.   The diet, however, does go beyond the plate, offering a few other suggestions.   Use healthy plant oils, such as olive, canola, soy, corn, sunflower and peanut. Check  the labels, and avoid partially hydrogenated oil, which have unhealthy trans fats.   If youre thirsty, drink water. Coffee and tea are good in moderation, but skip sugary drinks and limit milk and dairy products to one or two daily servings.   The type of carbohydrate in the diet is more important than the amount. Some sources of carbohydrates, such as vegetables, fruits, whole grains, and beans-are healthier than others.   Finally, stay active  Signed, Berniece Salines, DO  01/07/2020 10:07 AM    [place]

## 2020-02-11 ENCOUNTER — Telehealth: Payer: Self-pay

## 2020-02-11 NOTE — Telephone Encounter (Signed)
Spoke with patient regarding results and recommendation.  Patient verbalizes understanding and is agreeable to plan of care. Advised patient to call back with any issues or concerns.   Patient scheduled for next Tuesday the 14th.

## 2020-02-11 NOTE — Telephone Encounter (Signed)
-----   Message from Berniece Salines, DO sent at 02/11/2020  4:35 PM EDT ----- I like to see the patient sooner than October 7 to discuss her monitor results.

## 2020-02-18 ENCOUNTER — Other Ambulatory Visit: Payer: Self-pay

## 2020-02-18 ENCOUNTER — Encounter: Payer: Self-pay | Admitting: Cardiology

## 2020-02-18 ENCOUNTER — Ambulatory Visit (INDEPENDENT_AMBULATORY_CARE_PROVIDER_SITE_OTHER): Payer: Medicare Other | Admitting: Cardiology

## 2020-02-18 VITALS — BP 124/60 | HR 63 | Ht 66.0 in | Wt 170.0 lb

## 2020-02-18 DIAGNOSIS — R9431 Abnormal electrocardiogram [ECG] [EKG]: Secondary | ICD-10-CM | POA: Diagnosis not present

## 2020-02-18 DIAGNOSIS — I480 Paroxysmal atrial fibrillation: Secondary | ICD-10-CM

## 2020-02-18 DIAGNOSIS — I422 Other hypertrophic cardiomyopathy: Secondary | ICD-10-CM

## 2020-02-18 DIAGNOSIS — I472 Ventricular tachycardia: Secondary | ICD-10-CM

## 2020-02-18 DIAGNOSIS — I10 Essential (primary) hypertension: Secondary | ICD-10-CM

## 2020-02-18 DIAGNOSIS — E782 Mixed hyperlipidemia: Secondary | ICD-10-CM

## 2020-02-18 DIAGNOSIS — I471 Supraventricular tachycardia: Secondary | ICD-10-CM | POA: Diagnosis not present

## 2020-02-18 DIAGNOSIS — I4729 Other ventricular tachycardia: Secondary | ICD-10-CM

## 2020-02-18 MED ORDER — METOPROLOL TARTRATE 100 MG PO TABS
100.0000 mg | ORAL_TABLET | Freq: Once | ORAL | 0 refills | Status: DC
Start: 2020-02-18 — End: 2022-01-25

## 2020-02-18 MED ORDER — DILTIAZEM HCL ER COATED BEADS 180 MG PO CP24: 180.0000 mg | ORAL_CAPSULE | Freq: Every day | ORAL | 1 refills | Status: DC

## 2020-02-18 NOTE — Progress Notes (Signed)
Cardiology Office Note:    Date:  02/18/2020   ID:  Monica Anderson, DOB 03-31-1946, MRN [id number]  PCP:  Serita Grammes, MD  Cardiologist:  Berniece Salines, DO  Electrophysiologist:  None   Referring MD: Serita Grammes, MD   " I have been doing  Well"  History of Present Illness:    Monica Anderson a 74 y.o.femalewith a hx of Apical variant of hypertrophic cardiomyopathy, Paroxysmalatrial fibrillationdiagnosed in 2015on Cardizem and Eliquis, hypertension, hyperlipidemia, Mnire's disease, recently diagnosed BPPV.   I last the patient on 01/07/2020 at that time she was experiencing dizziness. Given her history of apical HCM with risk of ventricular arrhythmia I placed a monitor on the patient to understand more. She did wear the monitor. She is here today to discuss the result. She still does have some dizziness - no there complaints at this time.   Past Medical History:  Diagnosis Date  . Asymmetrical left sensorineural hearing loss 10/01/2019  . Atrial fibrillation ([place]) 10/02/2019  . Essential hypertension 10/03/2019  . History of appendectomy 10/02/2019  . History of atypical nevus 12/14/2016  . History of malignant melanoma of skin 06/16/2017  . 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, left ([place]) 01/17/2017  . Meniere's disease 08/29/2019  . Mixed hyperlipidemia 10/03/2019  . Tubal ligation evaluation 10/02/2019    Past Surgical History:  Procedure Laterality Date  . BREAST BIOPSY  1973  . CATARACT EXTRACTION, BILATERAL  2019  . MOHS SURGERY Left 2019   Melanoma on left ear  . TUBAL LIGATION  1977    Current Medications: Current Meds  Medication Sig  . B Complex-C-Folic Acid (HM SUPER VITAMIN B COMPLEX/C) TABS Take 1 tablet by mouth daily.  Marland Kitchen buPROPion (WELLBUTRIN XL) 150 MG 24 hr tablet Take 1 tablet by mouth daily.  . Calcium Carbonate-Vit D-Min (CALCIUM 1200 PO) Take 1,200 mg by mouth daily.  . cholecalciferol (VITAMIN D3) 25 MCG (1000  UNIT) tablet Take 2,000 Units by mouth daily.  Marland Kitchen diltiazem (CARDIZEM CD) 180 MG 24 hr capsule Take 1 capsule (180 mg total) by mouth daily.  Marland Kitchen ELIQUIS 5 MG TABS tablet Take 5 mg by mouth 2 (two) times daily.  Marland Kitchen ketoconazole (NIZORAL) 2 % cream ketoconazole 2 % topical cream  APPLY TO RASH IN SKIN FOLDS 2 TIMES DAILY X 14 DAYS AS NEEDED  . Omega-3 Fatty Acids (FISH OIL) 1000 MG CAPS Take 1,000 mg by mouth daily.   Marland Kitchen oxybutynin (DITROPAN-XL) 5 MG 24 hr tablet Take 5 mg by mouth daily.  . rosuvastatin (CRESTOR) 5 MG tablet Take 5 mg by mouth every evening.  Marland Kitchen spironolactone-hydrochlorothiazide (ALDACTAZIDE) 25-25 MG tablet Take 0.5 tablets by mouth daily.   . [DISCONTINUED] diltiazem (CARDIZEM CD) 120 MG 24 hr capsule Take by mouth daily.   Current Facility-Administered Medications for the 02/18/20 encounter (Office Visit) with Berniece Salines, DO  Medication  . triamcinolone acetonide (KENALOG-40) injection 20 mg     Allergies:   Patient has no known allergies.   Social History   Socioeconomic History  . Marital status: Married    Spouse name: Not on file  . Number of children: Not on file  . Years of education: Not on file  . Highest education level: Not on file  Occupational History  . Not on file  Tobacco Use  . Smoking status: Never Smoker  . Smokeless tobacco: Never Used  Substance and Sexual Activity  . Alcohol use: Yes    Comment: Wine  once every 2 months  . Drug use: Never  . Sexual activity: Not on file  Other Topics Concern  . Not on file  Social History Narrative  . Not on file   Social Determinants of Health   Financial Resource Strain:   . Difficulty of Paying Living Expenses: Not on file  Food Insecurity:   . Worried About Charity fundraiser in the Last Year: Not on file  . Ran Out of Food in the Last Year: Not on file  Transportation Needs:   . Lack of Transportation (Medical): Not on file  . Lack of Transportation (Non-Medical): Not on file  Physical  Activity:   . Days of Exercise per Week: Not on file  . Minutes of Exercise per Session: Not on file  Stress:   . Feeling of Stress : Not on file  Social Connections:   . Frequency of Communication with Friends and Family: Not on file  . Frequency of Social Gatherings with Friends and Family: Not on file  . Attends Religious Services: Not on file  . Active Member of Clubs or Organizations: Not on file  . Attends Archivist Meetings: Not on file  . Marital Status: Not on file     Family History: The patient's family history includes Atrial fibrillation in her mother; Diabetes in her father; Hypertension in her father and mother; Stroke in her father.  ROS:   Review of Systems  Constitution: Negative for decreased appetite, fever and weight gain.  HENT: Negative for congestion, ear discharge, hoarse voice and sore throat.   Eyes: Negative for discharge, redness, vision loss in right eye and visual halos.  Cardiovascular: Negative for chest pain, dyspnea on exertion, leg swelling, orthopnea and palpitations.  Respiratory: Negative for cough, hemoptysis, shortness of breath and snoring.   Endocrine: Negative for heat intolerance and polyphagia.  Hematologic/Lymphatic: Negative for bleeding problem. Does not bruise/bleed easily.  Skin: Negative for flushing, nail changes, rash and suspicious lesions.  Musculoskeletal: Negative for arthritis, joint pain, muscle cramps, myalgias, neck pain and stiffness.  Gastrointestinal: Negative for abdominal pain, bowel incontinence, diarrhea and excessive appetite.  Genitourinary: Negative for decreased libido, genital sores and incomplete emptying.  Neurological: Negative for brief paralysis, focal weakness, headaches and loss of balance.  Psychiatric/Behavioral: Negative for altered mental status, depression and suicidal ideas.  Allergic/Immunologic: Negative for HIV exposure and persistent infections.    EKGs/Labs/Other Studies Reviewed:     The following studies were reviewed today:   EKG:  The ekg ordered today demonstrates   Zio Monitor  The patient wore the monitor for 13 days 22 hours starting January 07, 2020. Indication: Dizziness  The minimum heart rate was 46 bpm, maximum heart rate was 158 bpm, and average heart rate was 69 bpm. Predominant underlying rhythm was Sinus Rhythm.  Idioventricular Rhythm was present.  4 Ventricular Tachycardia runs occurred, the run with the fastest interval lasting 14.7 secs with a maximum rate of 152 bpm (average 113 bpm); the run with the fastest interval was also the longest.  36 Supraventricular Tachycardia runs occurred, the run with the fastest interval lasting 4 beats with a maximum rate of 158 bpm, the longest lasting 14.3 secs with an average rate of 109 bpm.   Premature atrial complexes were rare (<1.0%). Premature Ventricular complexes were rare (<1.0%).  No pauses, No AV block and no atrial fibrillation present. No patient triggered events or diary events were noted.  Conclusion: This study is remarkable for the  following:                             1.  Nonsustained ventricular tachycardia.                             2.  Paroxysmal supraventricular tachycardia which is likely atrial tachycardia with variable block.  Echo IMPRESSIONS  1. Left ventricular ejection fraction, by estimation, is 60 to 65%. The left ventricle has normal function. The left ventricle has no regional  wall motion abnormalities. There is moderate concentric left ventricular hypertrophy. There is concern for  apical variant of hypertrophic cardiomyopathy, with post definity images also suggestive of Apical variant of HCM. Further testing with cardiac MRI is recommended.  2. Left ventricular diastolic parameters are consistent with Grade I diastolic dysfunction (impaired relaxation).  3. Right ventricular systolic function is normal. The right ventricular size is normal. There is  normal pulmonary artery systolic pressure.  4. The mitral valve is normal in structure. No evidence of mitral valve regurgitation. No evidence of mitral stenosis.  5. Tricuspid valve regurgitation is moderate.  6. The aortic valve is normal in structure. Aortic valve regurgitation is not visualized. No aortic stenosis is present.  7. The inferior vena cava is normal in size with greater than 50% respiratory variability, suggesting right atrial pressure of 3 mmHg.   CT coronary IMPRESSION: 1. LV apical hypertrophy measuring up to 5m (basal posterior wall measures 7263m, consistent with apical hypertrophic cardiomyopathy.  2.  No late gadolinium enhancement to suggest myocardial scar.  3.  Normal LV size with hyperdynamic systolic function (EF 7499%  4.  Normal RV size and systolic function (EF 6524%  5. Small pericardial effusion measures up to 63m68mdjacent to LV inferior wall.   Recent Labs: No results found for requested labs within last 8760 hours.  Recent Lipid Panel No results found for: CHOL, TRIG, HDL, CHOLHDL, VLDL, LDLCALC, LDLDIRECT  Physical Exam:    VS:  BP 124/60   Pulse 63   Ht _0  (1.676 m)   Wt 170 lb (77.1 kg)   SpO2 95%   BMI 27.44 kg/m     Wt Readings from Last 3 Encounters:  02/18/20 170 lb (77.1 kg)  01/07/20 173 lb (78.5 kg)  11/11/19 176 lb (79.8 kg)     GEN: Well nourished, well developed in no acute distress HEENT: Normal NECK: No JVD; No carotid bruits LYMPHATICS: No lymphadenopathy CARDIAC: S1S2 noted,RRR, no murmurs, rubs, gallops RESPIRATORY:  Clear to auscultation without rales, wheezing or rhonchi  ABDOMEN: Soft, non-tender, non-distended, +bowel sounds, no guarding. EXTREMITIES: No edema, No cyanosis, no clubbing MUSCULOSKELETAL:  No deformity  SKIN: Warm and dry NEUROLOGIC:  Alert and oriented x 3, non-focal PSYCHIATRIC:  Normal affect, good insight  ASSESSMENT:    1. Nonsustained ventricular tachycardia (HCC)   2.  Nonspecific abnormal electrocardiogram (ECG) (EKG)   3. Abnormal electrocardiogram   4. PAT (paroxysmal atrial tachycardia) (HCC)   5. Paroxysmal atrial fibrillation ([facility] 6. Essential hypertension   7. Apical variant hypertrophic cardiomyopathy ([place] 8. Mixed hyperlipidemia    PLAN:    Her monitor shows evidence of NSVT and PAT. I will increase her cardizem to 180 mg daily. In addition with her hx of apical HCM I will like her to be evaluated by our EP colleague for benefit of ICD. Her AHA HCM  SCD risk calculator notes Class 2A. However I will defer to EP at this time.   In the meantime, I will also rule out CAD as a culprit of her NSVT as well given suspected Ischemic changes on her ekg. A coronary CT will be appropriate at this time. I did educate the patient about this testing and all of her questions have been answered. She is agreeable to proceed with this. She has no IV contrast dye allergy.   Blood pressure acceptable today in the office.   She will continue her crestor.  The patient is in agreement with the above plan. The patient left the office in stable condition.  The patient will follow up in 3 months.   Medication Adjustments/Labs and Tests Ordered: Current medicines are reviewed at length with the patient today.  Concerns regarding medicines are outlined above.  Orders Placed This Encounter  Procedures  . CT CORONARY MORPH W/CTA COR W/SCORE W/CA W/CM &/OR WO/CM  . Basic metabolic panel  . Ambulatory referral to Cardiac Electrophysiology  . EKG 12-Lead   Meds ordered this encounter  Medications  . diltiazem (CARDIZEM CD) 180 MG 24 hr capsule    Sig: Take 1 capsule (180 mg total) by mouth daily.    Dispense:  90 capsule    Refill:  1  . metoprolol tartrate (LOPRESSOR) 100 MG tablet    Sig: Take 1 tablet (100 mg total) by mouth once for 1 dose. Take 2 hours before ct    Dispense:  1 tablet    Refill:  0    Patient Instructions  Medication Instructions:    Your physician has recommended you make the following change in your medication:   INCREASe: Cardizem to 180 mg daily   *If you need a refill on your cardiac medications before your next appointment, please call your pharmacy*   Lab Work: Your physician recommends that you return for lab work 3-7 days before ct   If you have labs (blood work) drawn today and your tests are completely normal, you will receive your results only by: Marland Kitchen MyChart Message (if you have MyChart) OR . A paper copy in the mail If you have any lab test that is abnormal or we need to change your treatment, we will call you to review the results.   Testing/Procedures: Your cardiac CT will be scheduled at one of the below locations:   [facility] [address] [place], [place] [address] [phone]  [place] [address] [place], [place] [address] [phone]  If scheduled at [facility], please arrive [facility] the [facility] main entrance of [facility] 30 minutes prior to test start time. Proceed to the [facility] Radiology Department (first floor) to check-in and test prep.  If scheduled at [facility], please arrive 15 mins early for check-in and test prep.  Please follow these instructions carefully (unless otherwise directed):  On the Night Before the Test: . Be sure to Drink plenty of water. . Do not consume any caffeinated/decaffeinated beverages or chocolate 12 hours prior to your test. . Do not take any antihistamines 12 hours prior to your test. .   On the Day of the Test: . Drink plenty of water. Do not drink any water within one hour of the test. . Do not eat any food 4 hours prior to the test. . You may take your regular medications prior to the  test.  . Take metoprolol (Lopressor) two hours prior to test. . HOLD spironolactone -Hydrochlorothiazide morning of the  test. . FEMALES- please wear underwire-free bra if available          After the Test: . Drink plenty of water. . After receiving IV contrast, you may experience a mild flushed feeling. This is normal. . On occasion, you may experience a mild rash up to 24 hours after the test. This is not dangerous. If this occurs, you can take Benadryl 25 mg and increase your fluid intake. . If you experience trouble breathing, this can be serious. If it is severe call 911 IMMEDIATELY. If it is mild, please call our office. . If you take any of these medications: Glipizide/Metformin, Avandament, Glucavance, please do not take 48 hours after completing test unless otherwise instructed.   Once we have confirmed authorization from your insurance company, we will call you to set up a date and time for your test. Based on how quickly your insurance processes prior authorizations requests, please allow up to 4 weeks to be contacted for scheduling your Cardiac CT appointment. Be advised that routine Cardiac CT appointments could be scheduled as many as 8 weeks after your provider has ordered it.  For non-scheduling related questions, please contact the cardiac imaging nurse navigator should you have any questions/concerns: Marchia Bond, Cardiac Imaging Nurse Navigator Burley Saver, Interim Cardiac Imaging Nurse [place] [facility] Direct Office Dial: [phone]   For scheduling needs, including cancellations and rescheduling, please call Vivien Rota at [phone], option [phone].      Follow-Up: At [facility], you and your health needs are our priority.  As part of our continuing mission to provide you with exceptional heart care, we have created designated Provider Care Teams.  These Care Teams include your primary Cardiologist (physician) and Advanced Practice Providers (APPs -  Physician Assistants and Nurse Practitioners) who all work together to provide you with the care you need,  when you need it.  We recommend signing up for the patient portal called "MyChart".  Sign up information is provided on this After Visit Summary.  MyChart is used to connect with patients for Virtual Visits (Telemedicine).  Patients are able to view lab/test results, encounter notes, upcoming appointments, etc.  Non-urgent messages can be sent to your provider as well.   To learn more about what you can do with MyChart, go to [email or web address].    Your next appointment:   3 month(s)  The format for your next appointment:   In Person  Provider:   Berniece Salines, DO   Other Instructions   Cardiac CT Angiogram A cardiac CT angiogram is a procedure to look at the heart and the area around the heart. It may be done to help find the cause of chest pains or other symptoms of heart disease. During this procedure, a substance called contrast dye is injected into the blood vessels in the area to be checked. A large X-ray machine, called a CT scanner, then takes detailed pictures of the heart and the surrounding area. The procedure is also sometimes called a coronary CT angiogram, coronary artery scanning, or CTA. A cardiac CT angiogram allows the health care provider to see how well blood is flowing to and from the heart. The health care provider will be able to see if there are any problems, such as:  Blockage or narrowing of the coronary arteries in the heart.  Fluid around the heart.  Signs  of weakness or disease in the muscles, valves, and tissues of the heart. Tell a health care provider about:  Any allergies you have. This is especially important if you have had a previous allergic reaction to contrast dye.  All medicines you are taking, including vitamins, herbs, eye drops, creams, and over-the-counter medicines.  Any blood disorders you have.  Any surgeries you have had.  Any medical conditions you have.  Whether you are pregnant or may be pregnant.  Any anxiety disorders,  chronic pain, or other conditions you have that may increase your stress or prevent you from lying still. What are the risks? Generally, this is a safe procedure. However, problems may occur, including:  Bleeding.  Infection.  Allergic reactions to medicines or dyes.  Damage to other structures or organs.  Kidney damage from the contrast dye that is used.  Increased risk of cancer from radiation exposure. This risk is low. Talk with your health care provider about: ? The risks and benefits of testing. ? How you can receive the lowest dose of radiation. What happens before the procedure?  Wear comfortable clothing and remove any jewelry, glasses, dentures, and hearing aids.  Follow instructions from your health care provider about eating and drinking. This may include: ? For 12 hours before the procedure -- avoid caffeine. This includes tea, coffee, soda, energy drinks, and diet pills. Drink plenty of water or other fluids that do not have caffeine in them. Being well hydrated can prevent complications. ? For 4-6 hours before the procedure -- stop eating and drinking. The contrast dye can cause nausea, but this is less likely if your stomach is empty.  Ask your health care provider about changing or stopping your regular medicines. This is especially important if you are taking diabetes medicines, blood thinners, or medicines to treat problems with erections (erectile dysfunction). What happens during the procedure?   Hair on your chest may need to be removed so that small sticky patches called electrodes can be placed on your chest. These will transmit information that helps to monitor your heart during the procedure.  An IV will be inserted into one of your veins.  You might be given a medicine to control your heart rate during the procedure. This will help to ensure that good images are obtained.  You will be asked to lie on an exam table. This table will slide in and out of the  CT machine during the procedure.  Contrast dye will be injected into the IV. You might feel warm, or you may get a metallic taste in your mouth.  You will be given a medicine called nitroglycerin. This will relax or dilate the arteries in your heart.  The table that you are lying on will move into the CT machine tunnel for the scan.  The person running the machine will give you instructions while the scans are being done. You may be asked to: ? Keep your arms above your head. ? Hold your breath. ? Stay very still, even if the table is moving.  When the scanning is complete, you will be moved out of the machine.  The IV will be removed. The procedure may vary among health care providers and hospitals. What can I expect after the procedure? After your procedure, it is common to have:  A metallic taste in your mouth from the contrast dye.  A feeling of warmth.  A headache from the nitroglycerin. Follow these instructions at home:  Take over-the-counter and  prescription medicines only as told by your health care provider.  If you are told, drink enough fluid to keep your urine pale yellow. This will help to flush the contrast dye out of your body.  Most people can return to their normal activities right after the procedure. Ask your health care provider what activities are safe for you.  It is up to you to get the results of your procedure. Ask your health care provider, or the department that is doing the procedure, when your results will be ready.  Keep all follow-up visits as told by your health care provider. This is important. Contact a health care provider if:  You have any symptoms of allergy to the contrast dye. These include: ? Shortness of breath. ? Rash or hives. ? A racing heartbeat. Summary  A cardiac CT angiogram is a procedure to look at the heart and the area around the heart. It may be done to help find the cause of chest pains or other symptoms of heart  disease.  During this procedure, a large X-ray machine, called a CT scanner, takes detailed pictures of the heart and the surrounding area after a contrast dye has been injected into blood vessels in the area.  Ask your health care provider about changing or stopping your regular medicines before the procedure. This is especially important if you are taking diabetes medicines, blood thinners, or medicines to treat erectile dysfunction.  If you are told, drink enough fluid to keep your urine pale yellow. This will help to flush the contrast dye out of your body. This information is not intended to replace advice given to you by your health care provider. Make sure you discuss any questions you have with your health care provider. Document Revised: 01/16/2019 Document Reviewed: 01/16/2019 Elsevier Patient Education  [place].      Adopting a Healthy Lifestyle.  Know what a healthy weight is for you (roughly BMI <25) and aim to maintain this   Aim for 7+ servings of fruits and vegetables daily   65-80+ fluid ounces of water or unsweet tea for healthy kidneys   Limit to max 1 drink of alcohol per day; avoid smoking/tobacco   Limit animal fats in diet for cholesterol and heart health - choose grass fed whenever available   Avoid highly processed foods, and foods high in saturated/trans fats   Aim for low stress - take time to unwind and care for your mental health   Aim for 150 min of moderate intensity exercise weekly for heart health, and weights twice weekly for bone health   Aim for 7-9 hours of sleep daily   When it comes to diets, agreement about the perfect plan isnt easy to find, even among the experts. Experts at the [place] developed an idea known as the Healthy Eating Plate. Just imagine a plate divided into logical, healthy portions.   The emphasis is on diet quality:   Load up on vegetables and fruits - one-half of your plate: Aim for  color and variety, and remember that potatoes dont count.   Go for whole grains - one-quarter of your plate: Whole wheat, barley, wheat berries, quinoa, oats, brown rice, and foods made with them. If you want pasta, go with whole wheat pasta.   Protein power - one-quarter of your plate: Fish, chicken, beans, and nuts are all healthy, versatile protein sources. Limit red meat.   The diet, however, does go beyond the plate,  offering a few other suggestions.   Use healthy plant oils, such as olive, canola, soy, corn, sunflower and peanut. Check the labels, and avoid partially hydrogenated oil, which have unhealthy trans fats.   If youre thirsty, drink water. Coffee and tea are good in moderation, but skip sugary drinks and limit milk and dairy products to one or two daily servings.   The type of carbohydrate in the diet is more important than the amount. Some sources of carbohydrates, such as vegetables, fruits, whole grains, and beans-are healthier than others.   Finally, stay active  Signed, Berniece Salines, DO  02/18/2020 7:58 PM    [facility]

## 2020-02-18 NOTE — Patient Instructions (Signed)
Medication Instructions:  Your physician has recommended you make the following change in your medication:   INCREASe: Cardizem to 180 mg daily   *If you need a refill on your cardiac medications before your next appointment, please call your pharmacy*   Lab Work: Your physician recommends that you return for lab work 3-7 days before ct   If you have labs (blood work) drawn today and your tests are completely normal, you will receive your results only by: Marland Kitchen MyChart Message (if you have MyChart) OR . A paper copy in the mail If you have any lab test that is abnormal or we need to change your treatment, we will call you to review the results.   Testing/Procedures: Your cardiac CT will be scheduled at one of the below locations:   [facility] [address] [place], [place] [address] [phone]  [place] [address] [place], [place] [address] [phone]  If scheduled at [facility], please arrive at the [facility] main entrance of [facility] 30 minutes prior to test start time. Proceed to the [facility] Radiology Department (first floor) to check-in and test prep.  If scheduled at [facility], please arrive 15 mins early for check-in and test prep.  Please follow these instructions carefully (unless otherwise directed):  On the Night Before the Test: . Be sure to Drink plenty of water. . Do not consume any caffeinated/decaffeinated beverages or chocolate 12 hours prior to your test. . Do not take any antihistamines 12 hours prior to your test. .   On the Day of the Test: . Drink plenty of water. Do not drink any water within one hour of the test. . Do not eat any food 4 hours prior to the test. . You may take your regular medications prior to the test.  . Take metoprolol (Lopressor) two hours prior to test. . HOLD spironolactone -Hydrochlorothiazide  morning of the test. . FEMALES- please wear underwire-free bra if available          After the Test: . Drink plenty of water. . After receiving IV contrast, you may experience a mild flushed feeling. This is normal. . On occasion, you may experience a mild rash up to 24 hours after the test. This is not dangerous. If this occurs, you can take Benadryl 25 mg and increase your fluid intake. . If you experience trouble breathing, this can be serious. If it is severe call 911 IMMEDIATELY. If it is mild, please call our office. . If you take any of these medications: Glipizide/Metformin, Avandament, Glucavance, please do not take 48 hours after completing test unless otherwise instructed.   Once we have confirmed authorization from your insurance company, we will call you to set up a date and time for your test. Based on how quickly your insurance processes prior authorizations requests, please allow up to 4 weeks to be contacted for scheduling your Cardiac CT appointment. Be advised that routine Cardiac CT appointments could be scheduled as many as 8 weeks after your provider has ordered it.  For non-scheduling related questions, please contact the cardiac imaging nurse navigator should you have any questions/concerns: Marchia Bond, Cardiac Imaging Nurse Navigator Burley Saver, Interim Cardiac Imaging Nurse [place] and V[facility] Services Direct Office Dial: [phone]   For scheduling needs, including cancellations and rescheduling, please call Vivien Rota at [phone], option 3.      Follow-Up: At  [facility], you and your health needs are our priority.  As part of our continuing mission to provide you with exceptional heart care, we have created designated Provider Care Teams.  These Care Teams include your primary Cardiologist (physician) and Advanced Practice Providers (APPs -  Physician Assistants and Nurse Practitioners) who all work together to provide you with the  care you need, when you need it.  We recommend signing up for the patient portal called "MyChart".  Sign up information is provided on this After Visit Summary.  MyChart is used to connect with patients for Virtual Visits (Telemedicine).  Patients are able to view lab/test results, encounter notes, upcoming appointments, etc.  Non-urgent messages can be sent to your provider as well.   To learn more about what you can do with MyChart, go to [email or web address].    Your next appointment:   3 month(s)  The format for your next appointment:   In Person  Provider:   Berniece Salines, DO   Other Instructions   Cardiac CT Angiogram A cardiac CT angiogram is a procedure to look at the heart and the area around the heart. It may be done to help find the cause of chest pains or other symptoms of heart disease. During this procedure, a substance called contrast dye is injected into the blood vessels in the area to be checked. A large X-ray machine, called a CT scanner, then takes detailed pictures of the heart and the surrounding area. The procedure is also sometimes called a coronary CT angiogram, coronary artery scanning, or CTA. A cardiac CT angiogram allows the health care provider to see how well blood is flowing to and from the heart. The health care provider will be able to see if there are any problems, such as:  Blockage or narrowing of the coronary arteries in the heart.  Fluid around the heart.  Signs of weakness or disease in the muscles, valves, and tissues of the heart. Tell a health care provider about:  Any allergies you have. This is especially important if you have had a previous allergic reaction to contrast dye.  All medicines you are taking, including vitamins, herbs, eye drops, creams, and over-the-counter medicines.  Any blood disorders you have.  Any surgeries you have had.  Any medical conditions you have.  Whether you are pregnant or may be pregnant.  Any  anxiety disorders, chronic pain, or other conditions you have that may increase your stress or prevent you from lying still. What are the risks? Generally, this is a safe procedure. However, problems may occur, including:  Bleeding.  Infection.  Allergic reactions to medicines or dyes.  Damage to other structures or organs.  Kidney damage from the contrast dye that is used.  Increased risk of cancer from radiation exposure. This risk is low. Talk with your health care provider about: ? The risks and benefits of testing. ? How you can receive the lowest dose of radiation. What happens before the procedure?  Wear comfortable clothing and remove any jewelry, glasses, dentures, and hearing aids.  Follow instructions from your health care provider about eating and drinking. This may include: ? For 12 hours before the procedure -- avoid caffeine. This includes tea, coffee, soda, energy drinks, and diet pills. Drink plenty of water or other fluids that do not have caffeine in them. Being well hydrated can prevent complications. ? For 4-6 hours before the procedure -- stop eating and drinking. The contrast dye can cause nausea, but this  is less likely if your stomach is empty.  Ask your health care provider about changing or stopping your regular medicines. This is especially important if you are taking diabetes medicines, blood thinners, or medicines to treat problems with erections (erectile dysfunction). What happens during the procedure?   Hair on your chest may need to be removed so that small sticky patches called electrodes can be placed on your chest. These will transmit information that helps to monitor your heart during the procedure.  An IV will be inserted into one of your veins.  You might be given a medicine to control your heart rate during the procedure. This will help to ensure that good images are obtained.  You will be asked to lie on an exam table. This table will slide  in and out of the CT machine during the procedure.  Contrast dye will be injected into the IV. You might feel warm, or you may get a metallic taste in your mouth.  You will be given a medicine called nitroglycerin. This will relax or dilate the arteries in your heart.  The table that you are lying on will move into the CT machine tunnel for the scan.  The person running the machine will give you instructions while the scans are being done. You may be asked to: ? Keep your arms above your head. ? Hold your breath. ? Stay very still, even if the table is moving.  When the scanning is complete, you will be moved out of the machine.  The IV will be removed. The procedure may vary among health care providers and hospitals. What can I expect after the procedure? After your procedure, it is common to have:  A metallic taste in your mouth from the contrast dye.  A feeling of warmth.  A headache from the nitroglycerin. Follow these instructions at home:  Take over-the-counter and prescription medicines only as told by your health care provider.  If you are told, drink enough fluid to keep your urine pale yellow. This will help to flush the contrast dye out of your body.  Most people can return to their normal activities right after the procedure. Ask your health care provider what activities are safe for you.  It is up to you to get the results of your procedure. Ask your health care provider, or the department that is doing the procedure, when your results will be ready.  Keep all follow-up visits as told by your health care provider. This is important. Contact a health care provider if:  You have any symptoms of allergy to the contrast dye. These include: ? Shortness of breath. ? Rash or hives. ? A racing heartbeat. Summary  A cardiac CT angiogram is a procedure to look at the heart and the area around the heart. It may be done to help find the cause of chest pains or other  symptoms of heart disease.  During this procedure, a large X-ray machine, called a CT scanner, takes detailed pictures of the heart and the surrounding area after a contrast dye has been injected into blood vessels in the area.  Ask your health care provider about changing or stopping your regular medicines before the procedure. This is especially important if you are taking diabetes medicines, blood thinners, or medicines to treat erectile dysfunction.  If you are told, drink enough fluid to keep your urine pale yellow. This will help to flush the contrast dye out of your body. This information is not  intended to replace advice given to you by your health care provider. Make sure you discuss any questions you have with your health care provider. Document Revised: 01/16/2019 Document Reviewed: 01/16/2019 Elsevier Patient Education  [place].

## 2020-03-03 LAB — BASIC METABOLIC PANEL
BUN/Creatinine Ratio: 23 (ref 12–28)
BUN: 23 mg/dL (ref 8–27)
CO2: 25 mmol/L (ref 20–29)
Calcium: 9.6 mg/dL (ref 8.7–10.3)
Chloride: 105 mmol/L (ref 96–106)
Creatinine, Ser: 1.01 mg/dL — ABNORMAL HIGH (ref 0.57–1.00)
GFR calc Af Amer: 63 mL/min/{1.73_m2} (ref >59)
GFR calc non Af Amer: 55 mL/min/{1.73_m2} — ABNORMAL LOW (ref >59)
Glucose: 126 mg/dL — ABNORMAL HIGH (ref 65–99)
Potassium: 3.9 mmol/L (ref 3.5–5.2)
Sodium: 144 mmol/L (ref 134–144)

## 2020-03-06 ENCOUNTER — Telehealth (HOSPITAL_COMMUNITY): Payer: Self-pay | Admitting: Emergency Medicine

## 2020-03-06 NOTE — Telephone Encounter (Signed)
Reaching out to patient to offer assistance regarding upcoming cardiac imaging study; pt verbalizes understanding of appt date/time, parking situation and where to check in, pre-test NPO status and medications ordered, and verified current allergies; name and call back number provided for further questions should they arise Marchia Bond RN Navigator Cardiac Imaging [place] [facility] [phone] office [phone] cell   Pt encouraged to check BP/HR at home 2 hr prior appt to see if PO metoprolol is needed. Told to only take it if HR is >60bpm Pt verbalized understanding. Pt holding spironolactone-HCTZ day of test

## 2020-03-09 ENCOUNTER — Other Ambulatory Visit: Payer: Self-pay

## 2020-03-09 ENCOUNTER — Ambulatory Visit (HOSPITAL_COMMUNITY)
Admission: RE | Admit: 2020-03-09 | Discharge: 2020-03-09 | Disposition: A | Discharge status: Home / Self Care | Payer: Medicare Other | Source: Ambulatory Visit | Attending: Cardiology | Admitting: Cardiology

## 2020-03-09 DIAGNOSIS — I472 Ventricular tachycardia: Secondary | ICD-10-CM | POA: Insufficient documentation

## 2020-03-09 DIAGNOSIS — I4729 Other ventricular tachycardia: Secondary | ICD-10-CM

## 2020-03-09 DIAGNOSIS — R9431 Abnormal electrocardiogram [ECG] [EKG]: Secondary | ICD-10-CM | POA: Insufficient documentation

## 2020-03-09 MED ORDER — NITROGLYCERIN 0.4 MG SL SUBL
0.8000 mg | SUBLINGUAL_TABLET | Freq: Once | SUBLINGUAL | Status: AC
  Administered 2020-03-09: 0.8 mg via SUBLINGUAL

## 2020-03-09 MED ORDER — IOHEXOL 350 MG/ML SOLN
80.0000 mL | Freq: Once | INTRAVENOUS | Status: AC | PRN
  Administered 2020-03-09: 80 mL via INTRAVENOUS

## 2020-03-09 MED ORDER — NITROGLYCERIN 0.4 MG SL SUBL
SUBLINGUAL_TABLET | SUBLINGUAL | Status: AC
  Filled 2020-03-09: qty 2

## 2020-03-09 NOTE — Progress Notes (Signed)
CT scan completed. Tolerated well. D/C home ambulatory, awake and alert. In no distress. 

## 2020-03-10 ENCOUNTER — Ambulatory Visit (INDEPENDENT_AMBULATORY_CARE_PROVIDER_SITE_OTHER): Payer: Medicare Other | Admitting: Cardiology

## 2020-03-10 ENCOUNTER — Telehealth: Payer: Self-pay

## 2020-03-10 ENCOUNTER — Other Ambulatory Visit: Payer: Self-pay

## 2020-03-10 ENCOUNTER — Encounter: Payer: Self-pay | Admitting: Cardiology

## 2020-03-10 VITALS — BP 118/60 | HR 53 | Ht 66.0 in | Wt 172.0 lb

## 2020-03-10 DIAGNOSIS — I422 Other hypertrophic cardiomyopathy: Secondary | ICD-10-CM

## 2020-03-10 NOTE — Telephone Encounter (Signed)
Spoke with patient regarding results and recommendation.  Patient verbalizes understanding and is agreeable to plan of care. Advised patient to call back with any issues or concerns.  

## 2020-03-10 NOTE — Telephone Encounter (Signed)
-----   Message from Berniece Salines, DO sent at 03/10/2020 12:45 PM EDT ----- Your CT scan only showed mild blockages, we will continue treating you with medications.  Static images let me know that you guys discussing for now the plan is to hold off on any implantation of ICD.  I am in agreement.  I will see you in December unless if you need to see me sooner.

## 2020-03-10 NOTE — Telephone Encounter (Signed)
Left message on patients voicemail to please return our call.   

## 2020-03-10 NOTE — Telephone Encounter (Signed)
Patient returning call, phone call dropped. Went straight to VM when trying to call back.

## 2020-03-10 NOTE — Progress Notes (Signed)
Electrophysiology Office Note   Date:  03/10/2020   ID:  Monica Anderson, DOB 1946-01-08, MRN [id number]  PCP:  Monica Grammes, MD  Cardiologist:  Tobb Primary Electrophysiologist:  Cledis Anderson Monica Leeds, MD    Chief Complaint: HCM   History of Present Illness: Monica Anderson is a 74 y.o. female who is being seen today for the evaluation of HCM at the request of Tobb, Kardie, DO. Presenting today for electrophysiology evaluation.  She has a history of paroxysmal atrial fibrillation, hyperlipidemia, and apical variant hypertrophic cardiomyopathy.  Hypertrophic cardiomyopathy was diagnosed on cardiac MRI.  Today, she denies symptoms of palpitations, chest pain, shortness of breath, orthopnea, PND, lower extremity edema, claudication, dizziness, presyncope, syncope, bleeding, or neurologic sequela. The patient is tolerating medications without difficulties.  Approximately 1 year ago she did have an episode of syncope.  This occurred in the middle of the night when she got up to go use the bathroom.  She had just gone from a laying to a standing position.  She does have a history of Mnire's disease and benign positional vertigo.  She has not had any other episodes of syncope or near syncope.  She currently feels well.  She is able do all of her daily activities without restriction.   Past Medical History:  Diagnosis Date  . Asymmetrical left sensorineural hearing loss 10/01/2019  . Atrial fibrillation ([place]) 10/02/2019  . Essential hypertension 10/03/2019  . History of appendectomy 10/02/2019  . History of atypical nevus 12/14/2016  . History of malignant melanoma of skin 06/16/2017  . 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, left ([place]) 01/17/2017  . Meniere's disease 08/29/2019  . Mixed hyperlipidemia 10/03/2019  . Tubal ligation evaluation 10/02/2019   Past Surgical History:  Procedure Laterality Date  . BREAST BIOPSY  1973  . CATARACT EXTRACTION, BILATERAL  2019    . MOHS SURGERY Left 2019   Melanoma on left ear  . TUBAL LIGATION  1977     Current Outpatient Medications  Medication Sig Dispense Refill  . B Complex-C-Folic Acid (HM SUPER VITAMIN B COMPLEX/C) TABS Take 1 tablet by mouth daily.    Marland Kitchen buPROPion (WELLBUTRIN XL) 150 MG 24 hr tablet Take 1 tablet by mouth daily.    . Calcium Carbonate-Vit D-Min (CALCIUM 1200 PO) Take 1,200 mg by mouth daily.    . cholecalciferol (VITAMIN D3) 25 MCG (1000 UNIT) tablet Take 2,000 Units by mouth daily.    Marland Kitchen diltiazem (CARDIZEM CD) 180 MG 24 hr capsule Take 1 capsule (180 mg total) by mouth daily. 90 capsule 1  . ELIQUIS 5 MG TABS tablet Take 5 mg by mouth 2 (two) times daily.  3  . ketoconazole (NIZORAL) 2 % cream ketoconazole 2 % topical cream  APPLY TO RASH IN SKIN FOLDS 2 TIMES DAILY X 14 DAYS AS NEEDED    . Omega-3 Fatty Acids (FISH OIL) 1000 MG CAPS Take 1,000 mg by mouth daily.     Marland Kitchen oxybutynin (DITROPAN-XL) 5 MG 24 hr tablet Take 5 mg by mouth daily.    . rosuvastatin (CRESTOR) 5 MG tablet Take 5 mg by mouth every evening.  12  . spironolactone-hydrochlorothiazide (ALDACTAZIDE) 25-25 MG tablet Take 0.5 tablets by mouth daily.     . metoprolol tartrate (LOPRESSOR) 100 MG tablet Take 1 tablet (100 mg total) by mouth once for 1 dose. Take 2 hours before ct 1 tablet 0   Current Facility-Administered Medications  Medication Dose Route Frequency Provider Last Rate  Last Admin  . triamcinolone acetonide (KENALOG-40) injection 20 mg  20 mg Other Once Landis Martins, DPM        Allergies:   Patient has no known allergies.   Social History:  The patient  reports that she has never smoked. She has never used smokeless tobacco. She reports current alcohol use. She reports that she does not use drugs.   Family History:  The patient's family history includes Atrial fibrillation in her mother; Diabetes in her father; Hypertension in her father and mother; Stroke in her father.    ROS:  Please see the history  of present illness.   Otherwise, review of systems is positive for none.   All other systems are reviewed and negative.    PHYSICAL EXAM: VS:  BP 118/60   Pulse (!) 53   Ht 5\' 6"  (1.676 m)   Wt 172 lb (78 kg)   SpO2 96%   BMI 27.76 kg/m  , BMI Body mass index is 27.76 kg/m. GEN: Well nourished, well developed, in no acute distress  HEENT: normal  Neck: no JVD, carotid bruits, or masses Cardiac: RRR; no murmurs, rubs, or gallops,no edema  Respiratory:  clear to auscultation bilaterally, normal work of breathing GI: soft, nontender, nondistended, + BS MS: no deformity or atrophy  Skin: warm and dry Neuro:  Strength and sensation are intact Psych: euthymic mood, full affect  EKG:  EKG is not ordered today. Personal review of the ekg ordered 02/18/2020 shows sinus rhythm, rate 63, LVH with T wave inversions  Recent Labs: 03/02/2020: BUN 23; Creatinine, Ser 1.01; Potassium 3.9; Sodium 144    Lipid Panel  No results found for: CHOL, TRIG, HDL, CHOLHDL, VLDL, LDLCALC, LDLDIRECT   Wt Readings from Last 3 Encounters:  03/10/20 172 lb (78 kg)  02/18/20 170 lb (77.1 kg)  01/07/20 173 lb (78.5 kg)      Other studies Reviewed: Additional studies/ records that were reviewed today include: CMRI  Review of the above records today demonstrates:  1. LV apical hypertrophy measuring up to 12mm (basal posterior wall measures 36mm), consistent with apical hypertrophic cardiomyopathy 2.  No late gadolinium enhancement to suggest myocardial scar 3.  Normal LV size with hyperdynamic systolic function (EF 16%) 4.  Normal RV size and systolic function (EF 10%) 5. Small pericardial effusion measures up to 3mm adjacent to LV inferior wall  Coronary CT 03/10/20 1. Coronary calcium score of 3. This was 31st percentile for age and sex matched control.  2. Normal coronary origin with right dominance.  3. Nonobstructive CAD with calcified plaque in the mid LAD and mid D1 causing minimal (0-24%)  stenosis  Cardiac monitor 02/11/2020 personally reviewed 4 Ventricular Tachycardia runs occurred, the run with the fastest interval lasting 14.7 secs with a maximum rate of 152 bpm (average 113 bpm); the run with the fastest interval was also the longest.  36 Supraventricular Tachycardia runs occurred, the run with the fastest interval lasting 4 beats with a maximum rate of 158 bpm, the longest lasting 14.3 secs with an average rate of 109 bpm.   ASSESSMENT AND PLAN:  1.  Apical hypertrophic cardiomyopathy: Patient is currently asymptomatic and has no evidence of heart failure.  She has had some nonsustained VT on cardiac monitoring, though at 74 years of age, it is unlikely that she Trigger Frasier have issues from her hypertrophic cardiomyopathy as she has not had any episodes of syncope, near syncope.  At this point, I do feel that we  can hold off on ICD implant.  2.  Paroxysmal atrial fibrillation: Currently on diltiazem, Eliquis.  CHA2DS2-VASc of 3.  She currently has minimal atrial fibrillation symptoms.  We'll make no changes.  3.  Hypertension: Currently well controlled  Case discussed with primary cardiology  Current medicines are reviewed at length with the patient today.   The patient does not have concerns regarding her medicines.  The following changes were made today:  none  Labs/ tests ordered today include:  No orders of the defined types were placed in this encounter.    Disposition:   FU with Marketta Valadez  months  Signed, Daeja Helderman Monica Leeds, MD  03/10/2020 10:26 AM     [facility] [address] [place] [address] [phone] (office) [phone] (fax)

## 2020-03-12 ENCOUNTER — Ambulatory Visit: Payer: Medicare Other | Admitting: Cardiology

## 2020-03-18 DIAGNOSIS — M1712 Unilateral primary osteoarthritis, left knee: Secondary | ICD-10-CM | POA: Insufficient documentation

## 2020-05-11 ENCOUNTER — Ambulatory Visit (INDEPENDENT_AMBULATORY_CARE_PROVIDER_SITE_OTHER): Payer: Medicare Other | Admitting: Cardiology

## 2020-05-11 ENCOUNTER — Other Ambulatory Visit: Payer: Self-pay

## 2020-05-11 ENCOUNTER — Encounter: Payer: Self-pay | Admitting: Cardiology

## 2020-05-11 VITALS — BP 130/66 | HR 68 | Ht 66.0 in | Wt 172.4 lb

## 2020-05-11 DIAGNOSIS — M1711 Unilateral primary osteoarthritis, right knee: Secondary | ICD-10-CM | POA: Insufficient documentation

## 2020-05-11 DIAGNOSIS — I422 Other hypertrophic cardiomyopathy: Secondary | ICD-10-CM

## 2020-05-11 DIAGNOSIS — I4729 Other ventricular tachycardia: Secondary | ICD-10-CM

## 2020-05-11 DIAGNOSIS — I480 Paroxysmal atrial fibrillation: Secondary | ICD-10-CM | POA: Diagnosis not present

## 2020-05-11 DIAGNOSIS — I471 Supraventricular tachycardia: Secondary | ICD-10-CM

## 2020-05-11 DIAGNOSIS — I10 Essential (primary) hypertension: Secondary | ICD-10-CM | POA: Diagnosis not present

## 2020-05-11 DIAGNOSIS — E782 Mixed hyperlipidemia: Secondary | ICD-10-CM

## 2020-05-11 DIAGNOSIS — I472 Ventricular tachycardia: Secondary | ICD-10-CM | POA: Diagnosis not present

## 2020-05-11 NOTE — Patient Instructions (Signed)
Medication Instructions:  Your physician recommends that you continue on your current medications as directed. Please refer to the Current Medication list given to you today.  *If you need a refill on your cardiac medications before your next appointment, please call your pharmacy*   Lab Work: NONE If you have labs (blood work) drawn today and your tests are completely normal, you will receive your results only by: Marland Kitchen MyChart Message (if you have MyChart) OR . A paper copy in the mail If you have any lab test that is abnormal or we need to change your treatment, we will call you to review the results.   Testing/Procedures: NONE   Follow-Up: At [facility], you and your health needs are our priority.  As part of our continuing mission to provide you with exceptional heart care, we have created designated Provider Care Teams.  These Care Teams include your primary Cardiologist (physician) and Advanced Practice Providers (APPs -  Physician Assistants and Nurse Practitioners) who all work together to provide you with the care you need, when you need it.  We recommend signing up for the patient portal called "MyChart".  Sign up information is provided on this After Visit Summary.  MyChart is used to connect with patients for Virtual Visits (Telemedicine).  Patients are able to view lab/test results, encounter notes, upcoming appointments, etc.  Non-urgent messages can be sent to your provider as well.   To learn more about what you can do with MyChart, go to [email or web address].    Your next appointment:   6 month(s)  The format for your next appointment:   In Person  Provider:   Berniece Salines, DO   Other Instructions NONE

## 2020-05-11 NOTE — Progress Notes (Signed)
Cardiology Office Note:    Date:  05/11/2020   ID:  Monica Anderson, DOB August 27, 1945, MRN [id number]  PCP:  Serita Grammes, MD  Cardiologist:  Berniece Salines, DO  Electrophysiologist:  None   Referring MD: Serita Grammes, MD   " I am doing fine"  History of Present Illness:    Monica Anderson is a 74 y.o. female with a hx of of Apical variant of hypertrophic cardiomyopath ( on echo and confirmed on Cardiac MR), Paroxysmalatrial fibrillationdiagnosed in 2015on Cardizem and Eliquis, hypertension, hyperlipidemia, Mnire's disease, recently diagnosed BPPV  She was then evaluated by EP on 03/10/2020 at that based on all the information reviewed it was recommenced to hold off o pursuing ICD - the patient was also on board with this.  She is here today for follow-up visit she offers no complaints at this time.  Past Medical History:  Diagnosis Date  . Asymmetrical left sensorineural hearing loss 10/01/2019  . Atrial fibrillation ([place]) 10/02/2019  . Essential hypertension 10/03/2019  . History of appendectomy 10/02/2019  . History of atypical nevus 12/14/2016  . History of malignant melanoma of skin 06/16/2017  . 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, left ([place]) 01/17/2017  . Meniere's disease 08/29/2019  . Mixed hyperlipidemia 10/03/2019  . Tubal ligation evaluation 10/02/2019    Past Surgical History:  Procedure Laterality Date  . BREAST BIOPSY  1973  . CATARACT EXTRACTION, BILATERAL  2019  . MOHS SURGERY Left 2019   Melanoma on left ear  . TUBAL LIGATION  1977    Current Medications: Current Meds  Medication Sig  . B Complex-C-Folic Acid (HM SUPER VITAMIN B COMPLEX/C) TABS Take 1 tablet by mouth daily.  Marland Kitchen buPROPion (WELLBUTRIN XL) 150 MG 24 hr tablet Take 1 tablet by mouth daily.  . Calcium Carbonate-Vit D-Min (CALCIUM 1200 PO) Take 1,200 mg by mouth daily.  . cholecalciferol (VITAMIN D3) 25 MCG (1000 UNIT) tablet Take 2,000 Units by mouth daily.  Marland Kitchen  diltiazem (CARDIZEM CD) 180 MG 24 hr capsule Take 1 capsule (180 mg total) by mouth daily.  Marland Kitchen ELIQUIS 5 MG TABS tablet Take 5 mg by mouth 2 (two) times daily.  Marland Kitchen ketoconazole (NIZORAL) 2 % cream ketoconazole 2 % topical cream  APPLY TO RASH IN SKIN FOLDS 2 TIMES DAILY X 14 DAYS AS NEEDED  . Omega-3 Fatty Acids (FISH OIL) 1000 MG CAPS Take 1,000 mg by mouth daily.   Marland Kitchen oxybutynin (DITROPAN-XL) 5 MG 24 hr tablet Take 5 mg by mouth daily.  . rosuvastatin (CRESTOR) 5 MG tablet Take 5 mg by mouth every evening.  Marland Kitchen spironolactone-hydrochlorothiazide (ALDACTAZIDE) 25-25 MG tablet Take 0.5 tablets by mouth daily.    Current Facility-Administered Medications for the 05/11/20 encounter (Office Visit) with Berniece Salines, DO  Medication  . triamcinolone acetonide (KENALOG-40) injection 20 mg     Allergies:   Patient has no known allergies.   Social History   Socioeconomic History  . Marital status: Married    Spouse name: Not on file  . Number of children: Not on file  . Years of education: Not on file  . Highest education level: Not on file  Occupational History  . Not on file  Tobacco Use  . Smoking status: Never Smoker  . Smokeless tobacco: Never Used  Substance and Sexual Activity  . Alcohol use: Yes    Comment: Wine once every 2 months  . Drug use: Never  . Sexual activity: Not on file  Other  Topics Concern  . Not on file  Social History Narrative  . Not on file   Social Determinants of Health   Financial Resource Strain:   . Difficulty of Paying Living Expenses: Not on file  Food Insecurity:   . Worried About Charity fundraiser in the Last Year: Not on file  . Ran Out of Food in the Last Year: Not on file  Transportation Needs:   . Lack of Transportation (Medical): Not on file  . Lack of Transportation (Non-Medical): Not on file  Physical Activity:   . Days of Exercise per Week: Not on file  . Minutes of Exercise per Session: Not on file  Stress:   . Feeling of Stress :  Not on file  Social Connections:   . Frequency of Communication with Friends and Family: Not on file  . Frequency of Social Gatherings with Friends and Family: Not on file  . Attends Religious Services: Not on file  . Active Member of Clubs or Organizations: Not on file  . Attends Archivist Meetings: Not on file  . Marital Status: Not on file     Family History: The patient's family history includes Atrial fibrillation in her mother; Diabetes in her father; Hypertension in her father and mother; Stroke in her father.  ROS:   Review of Systems  Constitution: Negative for decreased appetite, fever and weight gain.  HENT: Negative for congestion, ear discharge, hoarse voice and sore throat.   Eyes: Negative for discharge, redness, vision loss in right eye and visual halos.  Cardiovascular: Negative for chest pain, dyspnea on exertion, leg swelling, orthopnea and palpitations.  Respiratory: Negative for cough, hemoptysis, shortness of breath and snoring.   Endocrine: Negative for heat intolerance and polyphagia.  Hematologic/Lymphatic: Negative for bleeding problem. Does not bruise/bleed easily.  Skin: Negative for flushing, nail changes, rash and suspicious lesions.  Musculoskeletal: Negative for arthritis, joint pain, muscle cramps, myalgias, neck pain and stiffness.  Gastrointestinal: Negative for abdominal pain, bowel incontinence, diarrhea and excessive appetite.  Genitourinary: Negative for decreased libido, genital sores and incomplete emptying.  Neurological: Negative for brief paralysis, focal weakness, headaches and loss of balance.  Psychiatric/Behavioral: Negative for altered mental status, depression and suicidal ideas.  Allergic/Immunologic: Negative for HIV exposure and persistent infections.    EKGs/Labs/Other Studies Reviewed:    The following studies were reviewed today:   EKG: None today   Other studies Reviewed: Additional studies/ records that were  reviewed today include: CMRI  Review of the above records today demonstrates:  1. LV apical hypertrophy measuring up to 70mm (basal posterior wall measures 48mm), consistent with apical hypertrophic cardiomyopathy 2. No late gadolinium enhancement to suggest myocardial scar 3. Normal LV size with hyperdynamic systolic function (EF 17%) 4. Normal RV size and systolic function (EF 51%) 5. Small pericardial effusion measures up to 50mm adjacent to LV inferior wall  Coronary CT 03/10/20 1. Coronary calcium score of 3. This was 31st percentile for age and sex matched control.  2. Normal coronary origin with right dominance.  3. Nonobstructive CAD with calcified plaque in the mid LAD and mid D1 causing minimal (0-24%) stenosis  Cardiac monitor 02/11/2020 personally reviewed 4 Ventricular Tachycardia runs occurred, the run with the fastest interval lasting 14.7 secs with a maximum rate of 152 bpm (average 113 bpm); the run with the fastest interval was also the longest.  36 Supraventricular Tachycardia runs occurred, the run with the fastest interval lasting 4 beats with a maximum  rate of 158 bpm, the longest lasting 14.3 secs with an average rate of 109 bpm.    Recent Labs: 03/02/2020: BUN 23; Creatinine, Ser 1.01; Potassium 3.9; Sodium 144  Recent Lipid Panel No results found for: CHOL, TRIG, HDL, CHOLHDL, VLDL, LDLCALC, LDLDIRECT  Physical Exam:    VS:  BP 130/66   Pulse 68   Ht 5\' 6"  (1.676 m)   Wt 172 lb 6.4 oz (78.2 kg)   SpO2 97%   BMI 27.83 kg/m     Wt Readings from Last 3 Encounters:  05/11/20 172 lb 6.4 oz (78.2 kg)  03/10/20 172 lb (78 kg)  02/18/20 170 lb (77.1 kg)     GEN: Well nourished, well developed in no acute distress HEENT: Normal NECK: No JVD; No carotid bruits LYMPHATICS: No lymphadenopathy CARDIAC: S1S2 noted,RRR, no murmurs, rubs, gallops RESPIRATORY:  Clear to auscultation without rales, wheezing or rhonchi  ABDOMEN: Soft, non-tender,  non-distended, +bowel sounds, no guarding. EXTREMITIES: No edema, No cyanosis, no clubbing MUSCULOSKELETAL:  No deformity  SKIN: Warm and dry NEUROLOGIC:  Alert and oriented x 3, non-focal PSYCHIATRIC:  Normal affect, good insight  ASSESSMENT:    1. Paroxysmal atrial fibrillation (H[facility])   2. Essential hypertension   3. Apical variant hypertrophic cardiomyopathy ([place])   4. Nonsustained ventricular tachycardia ([place])   5. PAT (paroxysmal atrial tachycardia) ([place])   6. Mixed hyperlipidemia    PLAN:    She appears to be doing well from a cardiovascular standpoint.  Talk to the patient about her diagnosis occasionally will go hypertrophic cardiomyopathy.  Explained to the patient that she should avoid being dehydrated.  She is not on ACE or ARB have asked the patient to avoid these medications.   We also discussed her recent blood work which was done by her PCP showing hemoglobin A1c of 6.5 explained to the patient that hemoglobin A1c 6.5 or greater is diagnosis for diabetes.  Continue patient on Crestor.  The patient is in agreement with the above plan. The patient left the office in stable condition.  The patient will follow up in 6 months or sooner if needed............Marland Kitchen   Medication Adjustments/Labs and Tests Ordered: Current medicines are reviewed at length with the patient today.  Concerns regarding medicines are outlined above.  No orders of the defined types were placed in this encounter.  No orders of the defined types were placed in this encounter.   There are no Patient Instructions on file for this visit.   Adopting a Healthy Lifestyle.  Know what a healthy weight is for you (roughly BMI <25) and aim to maintain this   Aim for 7+ servings of fruits and vegetables daily   65-80+ fluid ounces of water or unsweet tea for healthy kidneys   Limit to max 1 drink of alcohol per day; avoid smoking/tobacco   Limit animal fats in diet for cholesterol and heart health - choose  grass fed whenever available   Avoid highly processed foods, and foods high in saturated/trans fats   Aim for low stress - take time to unwind and care for your mental health   Aim for 150 min of moderate intensity exercise weekly for heart health, and weights twice weekly for bone health   Aim for 7-9 hours of sleep daily   When it comes to diets, agreement about the perfect plan isnt easy to find, even among the experts. Experts at the [employer] developed an idea known as the [employer]  Plate. Just imagine a plate divided into logical, healthy portions.   The emphasis is on diet quality:   Load up on vegetables and fruits - one-half of your plate: Aim for color and variety, and remember that potatoes dont count.   Go for whole grains - one-quarter of your plate: Whole wheat, barley, wheat berries, quinoa, oats, brown rice, and foods made with them. If you want pasta, go with whole wheat pasta.   Protein power - one-quarter of your plate: Fish, chicken, beans, and nuts are all healthy, versatile protein sources. Limit red meat.   The diet, however, does go beyond the plate, offering a few other suggestions.   Use healthy plant oils, such as olive, canola, soy, corn, sunflower and peanut. Check the labels, and avoid partially hydrogenated oil, which have unhealthy trans fats.   If youre thirsty, drink water. Coffee and tea are good in moderation, but skip sugary drinks and limit milk and dairy products to one or two daily servings.   The type of carbohydrate in the diet is more important than the amount. Some sources of carbohydrates, such as vegetables, fruits, whole grains, and beans-are healthier than others.   Finally, stay active  Signed, Berniece Salines, DO  05/11/2020 3:19 PM    [facility]

## 2020-05-12 ENCOUNTER — Ambulatory Visit: Payer: Medicare Other | Admitting: Cardiology

## 2020-05-25 DIAGNOSIS — Z23 Encounter for immunization: Secondary | ICD-10-CM | POA: Diagnosis not present

## 2020-06-12 DIAGNOSIS — R351 Nocturia: Secondary | ICD-10-CM | POA: Diagnosis not present

## 2020-06-12 DIAGNOSIS — N3946 Mixed incontinence: Secondary | ICD-10-CM | POA: Diagnosis not present

## 2020-06-12 DIAGNOSIS — R350 Frequency of micturition: Secondary | ICD-10-CM | POA: Diagnosis not present

## 2020-06-25 ENCOUNTER — Other Ambulatory Visit: Payer: Self-pay | Admitting: Cardiology

## 2020-06-25 ENCOUNTER — Telehealth: Payer: Self-pay | Admitting: Cardiology

## 2020-06-25 MED ORDER — DILTIAZEM HCL ER COATED BEADS 180 MG PO CP24
180.0000 mg | ORAL_CAPSULE | Freq: Every day | ORAL | 2 refills | Status: DC
Start: 2020-06-25 — End: 2020-08-20

## 2020-06-25 NOTE — Telephone Encounter (Signed)
Refill of Diltiazem 180 mg [facility] to [facility].

## 2020-06-25 NOTE — Telephone Encounter (Signed)
°*  STAT* If patient is at the pharmacy, call can be transferred to refill team.   1. Which medications need to be refilled? (please list name of each medication and dose if known) * diltiazem (CARDIZEM CD) 180 MG 24 hr capsule   2. Which pharmacy/location (including street and city if local pharmacy) is medication to be sent to?  [id number] [place], [place] [address]. Do they need a 30 day or 90 day supply? 90  Patient states she is out of medication. Please advise.

## 2020-07-09 DIAGNOSIS — M170 Bilateral primary osteoarthritis of knee: Secondary | ICD-10-CM | POA: Diagnosis not present

## 2020-07-14 DIAGNOSIS — Z1231 Encounter for screening mammogram for malignant neoplasm of breast: Secondary | ICD-10-CM | POA: Diagnosis not present

## 2020-07-22 DIAGNOSIS — R350 Frequency of micturition: Secondary | ICD-10-CM | POA: Diagnosis not present

## 2020-07-22 DIAGNOSIS — N3946 Mixed incontinence: Secondary | ICD-10-CM | POA: Diagnosis not present

## 2020-08-06 DIAGNOSIS — I480 Paroxysmal atrial fibrillation: Secondary | ICD-10-CM | POA: Diagnosis not present

## 2020-08-06 DIAGNOSIS — R739 Hyperglycemia, unspecified: Secondary | ICD-10-CM | POA: Diagnosis not present

## 2020-08-06 DIAGNOSIS — I422 Other hypertrophic cardiomyopathy: Secondary | ICD-10-CM | POA: Diagnosis not present

## 2020-08-06 DIAGNOSIS — F4321 Adjustment disorder with depressed mood: Secondary | ICD-10-CM | POA: Diagnosis not present

## 2020-08-20 ENCOUNTER — Other Ambulatory Visit: Payer: Self-pay | Admitting: Cardiology

## 2020-08-20 DIAGNOSIS — H18503 Unspecified hereditary corneal dystrophies, bilateral: Secondary | ICD-10-CM | POA: Diagnosis not present

## 2020-08-20 DIAGNOSIS — H52221 Regular astigmatism, right eye: Secondary | ICD-10-CM | POA: Diagnosis not present

## 2020-08-20 DIAGNOSIS — H26491 Other secondary cataract, right eye: Secondary | ICD-10-CM | POA: Diagnosis not present

## 2020-08-20 DIAGNOSIS — H5201 Hypermetropia, right eye: Secondary | ICD-10-CM | POA: Diagnosis not present

## 2020-08-20 DIAGNOSIS — H5212 Myopia, left eye: Secondary | ICD-10-CM | POA: Diagnosis not present

## 2020-08-20 DIAGNOSIS — H26492 Other secondary cataract, left eye: Secondary | ICD-10-CM | POA: Diagnosis not present

## 2020-08-20 DIAGNOSIS — I10 Essential (primary) hypertension: Secondary | ICD-10-CM | POA: Diagnosis not present

## 2020-08-20 DIAGNOSIS — H524 Presbyopia: Secondary | ICD-10-CM | POA: Diagnosis not present

## 2020-08-20 DIAGNOSIS — Z961 Presence of intraocular lens: Secondary | ICD-10-CM | POA: Diagnosis not present

## 2020-09-28 DIAGNOSIS — R2681 Unsteadiness on feet: Secondary | ICD-10-CM | POA: Diagnosis not present

## 2020-09-28 DIAGNOSIS — Z6828 Body mass index (BMI) 28.0-28.9, adult: Secondary | ICD-10-CM | POA: Diagnosis not present

## 2020-09-29 DIAGNOSIS — R220 Localized swelling, mass and lump, head: Secondary | ICD-10-CM | POA: Diagnosis not present

## 2020-09-29 DIAGNOSIS — R2681 Unsteadiness on feet: Secondary | ICD-10-CM | POA: Diagnosis not present

## 2020-09-29 DIAGNOSIS — G459 Transient cerebral ischemic attack, unspecified: Secondary | ICD-10-CM | POA: Diagnosis not present

## 2020-10-01 DIAGNOSIS — R2681 Unsteadiness on feet: Secondary | ICD-10-CM | POA: Diagnosis not present

## 2020-10-01 DIAGNOSIS — Z9181 History of falling: Secondary | ICD-10-CM | POA: Diagnosis not present

## 2020-10-02 DIAGNOSIS — Z6827 Body mass index (BMI) 27.0-27.9, adult: Secondary | ICD-10-CM | POA: Diagnosis not present

## 2020-10-02 DIAGNOSIS — R2689 Other abnormalities of gait and mobility: Secondary | ICD-10-CM | POA: Diagnosis not present

## 2020-10-02 DIAGNOSIS — R220 Localized swelling, mass and lump, head: Secondary | ICD-10-CM | POA: Diagnosis not present

## 2020-10-02 DIAGNOSIS — R2681 Unsteadiness on feet: Secondary | ICD-10-CM | POA: Diagnosis not present

## 2020-10-05 ENCOUNTER — Encounter: Payer: Self-pay | Admitting: Diagnostic Neuroimaging

## 2020-10-05 ENCOUNTER — Ambulatory Visit (INDEPENDENT_AMBULATORY_CARE_PROVIDER_SITE_OTHER): Payer: Medicare Other | Admitting: Diagnostic Neuroimaging

## 2020-10-05 VITALS — BP 142/66 | HR 66 | Ht 67.0 in | Wt 173.0 lb

## 2020-10-05 DIAGNOSIS — R269 Unspecified abnormalities of gait and mobility: Secondary | ICD-10-CM

## 2020-10-05 DIAGNOSIS — M170 Bilateral primary osteoarthritis of knee: Secondary | ICD-10-CM | POA: Diagnosis not present

## 2020-10-05 DIAGNOSIS — G959 Disease of spinal cord, unspecified: Secondary | ICD-10-CM

## 2020-10-05 NOTE — Progress Notes (Signed)
[facility]  PATIENT: Monica Anderson DOB: [birth year]  REFERRING CLINICIAN: Serita Grammes, MD HISTORY FROM: patient  REASON FOR VISIT: new consult    HISTORICAL  CHIEF COMPLAINT:  Chief Complaint  Patient presents with  . New Patient (Initial Visit)    Rm 6, w/ husband, pt c/o  of balance when walking, around April 19th, somedays are worse then other, has had a fall last Wednesday, no head injury. Feels the most unsteady when walking. Walking slower causes her to loose balance more. Uses cane when alone.    HISTORY OF PRESENT ILLNESS:   75 year old female here for evaluation of gait suspected.  09/22/2020 patient developed sudden onset of stabbing sensation with walking.  She felt her balance was quite off.  She has been under slightly increased rest of the time.  Patient went to PCP for evaluation and had CT and MRI of the brain which were unremarkable.  At that time symptoms of slightly improved.  Patient does have history of Mnire's disease and benign positional vertigo in the past, previously treated with vestibular therapy.  Current symptoms feel different.  She does not have any vertigo or spinning sensation.  No nausea or vomiting.  She does have some neck pain.  No weakness in arms or legs.    REVIEW OF SYSTEMS: Full 14 system review of systems performed and negative with exception of: as per HPI.  ALLERGIES: No Known Allergies  HOME MEDICATIONS: Outpatient Medications Prior to Visit  Medication Sig Dispense Refill  . B Complex-C-Folic Acid (HM SUPER VITAMIN B COMPLEX/C) TABS Take 1 tablet by mouth daily.    Marland Kitchen buPROPion (WELLBUTRIN XL) 150 MG 24 hr tablet Take 1 tablet by mouth daily.    . Calcium Carbonate-Vit D-Min (CALCIUM 1200 PO) Take 1,200 mg by mouth daily.    . cholecalciferol (VITAMIN D3) 25 MCG (1000 UNIT) tablet Take 2,000 Units by mouth daily.    Marland Kitchen diltiazem (CARDIZEM CD) 180 MG 24 hr capsule TAKE 1 CAPSULE BY MOUTH EVERY DAY 90  capsule 1  . ELIQUIS 5 MG TABS tablet Take 5 mg by mouth 2 (two) times daily.  3  . ketoconazole (NIZORAL) 2 % cream ketoconazole 2 % topical cream  APPLY TO RASH IN SKIN FOLDS 2 TIMES DAILY X 14 DAYS AS NEEDED    . Omega-3 Fatty Acids (FISH OIL) 1000 MG CAPS Take 1,000 mg by mouth daily.     Marland Kitchen oxybutynin (DITROPAN-XL) 5 MG 24 hr tablet Take 5 mg by mouth daily.    . rosuvastatin (CRESTOR) 5 MG tablet Take 5 mg by mouth every evening.  12  . spironolactone-hydrochlorothiazide (ALDACTAZIDE) 25-25 MG tablet Take 0.5 tablets by mouth daily.     . metoprolol tartrate (LOPRESSOR) 100 MG tablet Take 1 tablet (100 mg total) by mouth once for 1 dose. Take 2 hours before ct 1 tablet 0   Facility-Administered Medications Prior to Visit  Medication Dose Route Frequency Provider Last Rate Last Admin  . triamcinolone acetonide (KENALOG-40) injection 20 mg  20 mg Other Once Landis Martins, DPM        PAST MEDICAL HISTORY: Past Medical History:  Diagnosis Date  . Asymmetrical left sensorineural hearing loss 10/01/2019  . Atrial fibrillation ([place]) 10/02/2019  . Essential hypertension 10/03/2019  . History of appendectomy 10/02/2019  . History of atypical nevus 12/14/2016  . History of malignant melanoma of skin 06/16/2017  . 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, left ([place]) 01/17/2017  .  Meniere's disease 08/29/2019  . Mixed hyperlipidemia 10/03/2019  . Tubal ligation evaluation 10/02/2019    PAST SURGICAL HISTORY: Past Surgical History:  Procedure Laterality Date  . BREAST BIOPSY  1973  . CATARACT EXTRACTION, BILATERAL  2019  . MOHS SURGERY Left 2019   Melanoma on left ear  . TUBAL LIGATION  1977    FAMILY HISTORY: Family History  Problem Relation Age of Onset  . Hypertension Mother   . Atrial fibrillation Mother   . Hypertension Father   . Stroke Father   . Diabetes Father     SOCIAL HISTORY: Social History   Socioeconomic History  . Marital status: Married     Spouse name: Not on file  . Number of children: Not on file  . Years of education: Not on file  . Highest education level: Not on file  Occupational History  . Not on file  Tobacco Use  . Smoking status: Never Smoker  . Smokeless tobacco: Never Used  Substance and Sexual Activity  . Alcohol use: Yes    Comment: Wine once every 2 months  . Drug use: Never  . Sexual activity: Not on file  Other Topics Concern  . Not on file  Social History Narrative   Right handed    caffeine: 1 cup of decaf in the AM   Lives with husband and one son   Social Determinants of Health   Financial Resource Strain: Not on file  Food Insecurity: Not on file  Transportation Needs: Not on file  Physical Activity: Not on file  Stress: Not on file  Social Connections: Not on file  Intimate Partner Violence: Not on file     PHYSICAL EXAM  GENERAL EXAM/CONSTITUTIONAL: Vitals:  Vitals:   10/05/20 1523  BP: (!) 142/66  Pulse: 66  Weight: 173 lb (78.5 kg)  Height: 5\' 7"  (1.702 m)     Body mass index is 27.1 kg/m. Wt Readings from Last 3 Encounters:  10/05/20 173 lb (78.5 kg)  05/11/20 172 lb 6.4 oz (78.2 kg)  03/10/20 172 lb (78 kg)     Patient is in no distress; well developed, nourished and groomed; neck is supple  CARDIOVASCULAR:  Examination of carotid arteries is normal; no carotid bruits  Regular rate and rhythm, no murmurs  Examination of peripheral vascular system by observation and palpation is normal  EYES:  Ophthalmoscopic exam of optic discs and posterior segments is normal; no papilledema or hemorrhages  No exam data present  MUSCULOSKELETAL:  Gait, strength, tone, movements noted in Neurologic exam below  NEUROLOGIC: MENTAL STATUS:  No flowsheet data found.  awake, alert, oriented to person, place and time  recent and remote memory intact  normal attention and concentration  language fluent, comprehension intact, naming intact  fund of knowledge  appropriate  CRANIAL NERVE:   2nd - no papilledema on fundoscopic exam  2nd, 3rd, 4th, 6th - pupils equal and reactive to light, visual fields full to confrontation, extraocular muscles intact, no nystagmus  5th - facial sensation symmetric  7th - facial strength symmetric  8th - hearing intact  9th - palate elevates symmetrically, uvula midline  11th - shoulder shrug symmetric  12th - tongue protrusion midline  MOTOR:   normal bulk and tone, full strength in the BUE, BLE  SENSORY:   normal and symmetric to light touch, temperature, vibration  COORDINATION:   finger-nose-finger, fine finger movements normal  REFLEXES:   deep tendon reflexes SLIGHTLY BRISK IN ARMS; 1+ IN  LEGS  GAIT/STATION:   WIDE based gait; SHORT STEPS; SLIGHTLY SPASTIC GAIT AND UNSTEADY     DIAGNOSTIC DATA (LABS, IMAGING, TESTING) - I reviewed patient records, labs, notes, testing and imaging myself where available.  No results found for: WBC, HGB, HCT, MCV, PLT    Component Value Date/Time   NA 144 03/02/2020 1447   K 3.9 03/02/2020 1447   CL 105 03/02/2020 1447   CO2 25 03/02/2020 1447   GLUCOSE 126 (H) 03/02/2020 1447   BUN 23 03/02/2020 1447   CREATININE 1.01 (H) 03/02/2020 1447   CALCIUM 9.6 03/02/2020 1447   GFRNONAA 55 (L) 03/02/2020 1447   GFRAA 63 03/02/2020 1447   No results found for: CHOL, HDL, LDLCALC, LDLDIRECT, TRIG, CHOLHDL No results found for: HGBA1C No results found for: VITAMINB12 No results found for: TSH   09/05/19 MRI brain - normal  09/29/20 CT head / MRI brain  - no acute findings  A1c - 5.8   ASSESSMENT AND PLAN  75 y.o. year old female here with:  Dx:  1. Gait difficulty   2. Myelopathy ([place])     PLAN:  GAIT DIFFICULTY (spastic gait; hyperreflexia; new onset since past 1 month) - check MRI cervical / thoracic spine (rule out myelopathy) - follow up with PT - check B12  Orders Placed This Encounter  Procedures  . MR CERVICAL SPINE  WO CONTRAST  . MR THORACIC SPINE WO CONTRAST  . Vitamin B12   Return for pending if symptoms worsen or fail to improve.    Penni Bombard, MD 0/02/3234, 5:73 PM Certified in Neurology, Neurophysiology and Neuroimaging  [facility] [address] [place], [place] [address] [phone]

## 2020-10-05 NOTE — Patient Instructions (Signed)
GAIT DIFFICULTY (spastic gait; hyperreflexia) - check MRI cervical spine (rule out myelopathy) - follow up with PT - check B12

## 2020-10-06 ENCOUNTER — Telehealth: Payer: Self-pay | Admitting: Diagnostic Neuroimaging

## 2020-10-06 LAB — VITAMIN B12: Vitamin B-12: 938 pg/mL (ref 232–1245)

## 2020-10-06 NOTE — Telephone Encounter (Signed)
Medicare/cigna order sent to GI. They will reach out to the patient to schedule and obtain auth for Cigna.

## 2020-10-07 ENCOUNTER — Telehealth: Payer: Self-pay | Admitting: *Deleted

## 2020-10-07 NOTE — Telephone Encounter (Signed)
Called patient and informed her vitamin B12 is normal. Patient verbalized understanding, appreciation.

## 2020-10-08 DIAGNOSIS — M2560 Stiffness of unspecified joint, not elsewhere classified: Secondary | ICD-10-CM | POA: Diagnosis not present

## 2020-10-08 DIAGNOSIS — M6281 Muscle weakness (generalized): Secondary | ICD-10-CM | POA: Diagnosis not present

## 2020-10-08 DIAGNOSIS — R2681 Unsteadiness on feet: Secondary | ICD-10-CM | POA: Diagnosis not present

## 2020-10-09 ENCOUNTER — Other Ambulatory Visit: Payer: Self-pay

## 2020-10-09 ENCOUNTER — Ambulatory Visit
Admission: RE | Admit: 2020-10-09 | Discharge: 2020-10-09 | Disposition: A | Discharge status: Home / Self Care | Payer: Medicare Other | Source: Ambulatory Visit | Attending: Diagnostic Neuroimaging | Admitting: Diagnostic Neuroimaging

## 2020-10-09 DIAGNOSIS — R269 Unspecified abnormalities of gait and mobility: Secondary | ICD-10-CM

## 2020-10-09 DIAGNOSIS — G959 Disease of spinal cord, unspecified: Secondary | ICD-10-CM

## 2020-10-15 ENCOUNTER — Telehealth: Payer: Self-pay | Admitting: *Deleted

## 2020-10-15 NOTE — Telephone Encounter (Signed)
Spoke with patient and informe dher the MRI cervical spine showed mild degenerative changes at C5-6, C6-7. No major spinal cord or nerve issues. The MRI thoracic spine results are unremarkable imaging results. Continue current plan with PT. Patient verbalized understanding, appreciation.

## 2020-10-16 ENCOUNTER — Other Ambulatory Visit: Payer: Medicare Other

## 2020-11-17 ENCOUNTER — Encounter: Payer: Self-pay | Admitting: Cardiology

## 2020-11-17 ENCOUNTER — Ambulatory Visit (INDEPENDENT_AMBULATORY_CARE_PROVIDER_SITE_OTHER): Payer: Medicare Other | Admitting: Cardiology

## 2020-11-17 ENCOUNTER — Other Ambulatory Visit: Payer: Self-pay

## 2020-11-17 VITALS — BP 132/78 | HR 76 | Ht 67.0 in | Wt 170.8 lb

## 2020-11-17 DIAGNOSIS — I472 Ventricular tachycardia: Secondary | ICD-10-CM

## 2020-11-17 DIAGNOSIS — I480 Paroxysmal atrial fibrillation: Secondary | ICD-10-CM

## 2020-11-17 DIAGNOSIS — I422 Other hypertrophic cardiomyopathy: Secondary | ICD-10-CM

## 2020-11-17 DIAGNOSIS — I471 Supraventricular tachycardia: Secondary | ICD-10-CM

## 2020-11-17 DIAGNOSIS — I10 Essential (primary) hypertension: Secondary | ICD-10-CM | POA: Diagnosis not present

## 2020-11-17 DIAGNOSIS — I4729 Other ventricular tachycardia: Secondary | ICD-10-CM

## 2020-11-17 NOTE — Progress Notes (Signed)
Cardiology Office Note:    Date:  11/17/2020   ID:  Monica Anderson, DOB 10-Sep-1945, MRN [id number]  PCP:  Serita Grammes, MD  Cardiologist:  Berniece Salines, DO  Electrophysiologist:  None   Referring MD: Serita Grammes, MD   No chief complaint on file. From my heart I am doing very well but recently have had some ataxia  History of Present Illness:    Monica Anderson is a 75 y.o. female with a hx of  Apical variant of hypertrophic cardiomyopath ( on echo and confirmed on Cardiac MR), Paroxysmal atrial fibrillation diagnosed in 2015 on Cardizem and Eliquis, hypertension, hyperlipidemia, Mnire's disease, recently diagnosed BPPV   She was evaluated by EP on 03/10/2020 at that based on all the information reviewed it was recommenced to hold off o pursuing ICD - the patient was also on board with this.  I saw the patient in May 11, 2020 at that time she appeared doing well from a cardiovascular standpoint.  We talked about her diagnosis she had questions which I was able to answer.  Today she offers no complaints she tells me she has been doing well.  No chest pain, no shortness of breath no syncope  Past Medical History:  Diagnosis Date   Asymmetrical left sensorineural hearing loss 10/01/2019   Atrial fibrillation ([employer]) 10/02/2019   Essential hypertension 10/03/2019   History of appendectomy 10/02/2019   History of atypical nevus 12/14/2016   History of malignant melanoma of skin 06/16/2017   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, left ([place]) 01/17/2017   Meniere's disease 08/29/2019   Mixed hyperlipidemia 10/03/2019   Tubal ligation evaluation 10/02/2019    Past Surgical History:  Procedure Laterality Date   BREAST BIOPSY  1973   CATARACT EXTRACTION, BILATERAL  2019   MOHS SURGERY Left 2019   Melanoma on left ear   TUBAL LIGATION  1977    Current Medications: Current Meds  Medication Sig   B Complex-C-Folic Acid (HM SUPER VITAMIN B COMPLEX/C) TABS  Take 1 tablet by mouth daily.   buPROPion (WELLBUTRIN XL) 150 MG 24 hr tablet Take 1 tablet by mouth daily.   Calcium Carbonate-Vit D-Min (CALCIUM 1200 PO) Take 1,200 mg by mouth daily.   cholecalciferol (VITAMIN D3) 25 MCG (1000 UNIT) tablet Take 2,000 Units by mouth daily.   diltiazem (CARDIZEM CD) 180 MG 24 hr capsule TAKE 1 CAPSULE BY MOUTH EVERY DAY   ELIQUIS 5 MG TABS tablet Take 5 mg by mouth 2 (two) times daily.   ketoconazole (NIZORAL) 2 % cream ketoconazole 2 % topical cream  APPLY TO RASH IN SKIN FOLDS 2 TIMES DAILY X 14 DAYS AS NEEDED   Omega-3 Fatty Acids (FISH OIL) 1000 MG CAPS Take 1,000 mg by mouth daily.    oxybutynin (DITROPAN-XL) 5 MG 24 hr tablet Take 5 mg by mouth daily.   rosuvastatin (CRESTOR) 5 MG tablet Take 5 mg by mouth every evening.   spironolactone-hydrochlorothiazide (ALDACTAZIDE) 25-25 MG tablet Take 0.5 tablets by mouth daily.    Current Facility-Administered Medications for the 11/17/20 encounter (Office Visit) with Berniece Salines, DO  Medication   triamcinolone acetonide (KENALOG-40) injection 20 mg     Allergies:   Patient has no known allergies.   Social History   Socioeconomic History   Marital status: Married    Spouse name: Not on file   Number of children: Not on file   Years of education: Not on file   Highest education level:  Not on file  Occupational History   Not on file  Tobacco Use   Smoking status: Never   Smokeless tobacco: Never  Substance and Sexual Activity   Alcohol use: Yes    Comment: Wine once every 2 months   Drug use: Never   Sexual activity: Not on file  Other Topics Concern   Not on file  Social History Narrative   Right handed    caffeine: 1 cup of decaf in the AM   Lives with husband and one son   Social Determinants of Health   Financial Resource Strain: Not on file  Food Insecurity: Not on file  Transportation Needs: Not on file  Physical Activity: Not on file  Stress: Not on file  Social Connections: Not  on file     Family History: The patient's family history includes Atrial fibrillation in her mother; Diabetes in her father; Hypertension in her father and mother; Stroke in her father.  ROS:   Review of Systems  Constitution: Negative for decreased appetite, fever and weight gain.  HENT: Negative for congestion, ear discharge, hoarse voice and sore throat.   Eyes: Negative for discharge, redness, vision loss in right eye and visual halos.  Cardiovascular: Negative for chest pain, dyspnea on exertion, leg swelling, orthopnea and palpitations.  Respiratory: Negative for cough, hemoptysis, shortness of breath and snoring.   Endocrine: Negative for heat intolerance and polyphagia.  Hematologic/Lymphatic: Negative for bleeding problem. Does not bruise/bleed easily.  Skin: Negative for flushing, nail changes, rash and suspicious lesions.  Musculoskeletal: Negative for arthritis, joint pain, muscle cramps, myalgias, neck pain and stiffness.  Gastrointestinal: Negative for abdominal pain, bowel incontinence, diarrhea and excessive appetite.  Genitourinary: Negative for decreased libido, genital sores and incomplete emptying.  Neurological: Negative for brief paralysis, focal weakness, headaches and loss of balance.  Psychiatric/Behavioral: Negative for altered mental status, depression and suicidal ideas.  Allergic/Immunologic: Negative for HIV exposure and persistent infections.    EKGs/Labs/Other Studies Reviewed:    The following studies were reviewed today:   EKG: None today   Other studies Reviewed: Additional studies/ records that were reviewed today include: CMRI  Review of the above records today demonstrates:  1. LV apical hypertrophy measuring up to 4mm (basal posterior wall measures 47mm), consistent with apical hypertrophic cardiomyopathy 2.  No late gadolinium enhancement to suggest myocardial scar 3.  Normal LV size with hyperdynamic systolic function (EF 73%) 4.  Normal  RV size and systolic function (EF 71%) 5. Small pericardial effusion measures up to 83mm adjacent to LV inferior wall   Coronary CT 03/10/20 1. Coronary calcium score of 3. This was 31st percentile for age and sex matched control.   2. Normal coronary origin with right dominance.   3. Nonobstructive CAD with calcified plaque in the mid LAD and mid D1 causing minimal (0-24%) stenosis   Cardiac monitor 02/11/2020 personally reviewed 4 Ventricular Tachycardia runs occurred, the run with the fastest interval lasting 14.7 secs with a maximum rate of 152 bpm (average 113 bpm); the run with the fastest interval was also the longest.   36 Supraventricular Tachycardia runs occurred, the run with the fastest interval lasting 4 beats with a maximum rate of 158 bpm, the longest lasting 14.3 secs with an average rate of 109 bpm.  Recent Labs: 03/02/2020: BUN 23; Creatinine, Ser 1.01; Potassium 3.9; Sodium 144  Recent Lipid Panel No results found for: CHOL, TRIG, HDL, CHOLHDL, VLDL, LDLCALC, LDLDIRECT  Physical Exam:    VS:  BP 132/78   Pulse 76   Ht 5\' 7"  (1.702 m)   Wt 170 lb 12.8 oz (77.5 kg)   SpO2 98%   BMI 26.75 kg/m     Wt Readings from Last 3 Encounters:  11/17/20 170 lb 12.8 oz (77.5 kg)  10/05/20 173 lb (78.5 kg)  05/11/20 172 lb 6.4 oz (78.2 kg)     GEN: Well nourished, well developed in no acute distress HEENT: Normal NECK: No JVD; No carotid bruits LYMPHATICS: No lymphadenopathy CARDIAC: S1S2 noted,RRR, no murmurs, rubs, gallops RESPIRATORY:  Clear to auscultation without rales, wheezing or rhonchi  ABDOMEN: Soft, non-tender, non-distended, +bowel sounds, no guarding. EXTREMITIES: No edema, No cyanosis, no clubbing MUSCULOSKELETAL:  No deformity  SKIN: Warm and dry NEUROLOGIC:  Alert and oriented x 3, non-focal PSYCHIATRIC:  Normal affect, good insight  ASSESSMENT:    1. Apical variant hypertrophic cardiomyopathy ([place])   2. Essential hypertension   3. Nonsustained  ventricular tachycardia ([place])   4. PAT (paroxysmal atrial tachycardia) ([facility])   5. Paroxysmal atrial fibrillation ([place])    PLAN:     Appears to be doing well from a cardiovascular standpoint.  No medication changes will be made.  Again today I discussed with the patient that her first-degree relatives including her children needs to be tested for hypertrophic cardiomyopathy-I explained to her that this can be a genetic testing which can be able to identify relatives that her risk for this disease.  She did note that she has spoken to her adult children and she will check with them again to make sure that they have gotten tested.  Continue the patient on her Eliquis 5 mg twice daily for paroxysmal atrial fibrillation.  The patient is in agreement with the above plan. The patient left the office in stable condition.  The patient will follow up in 1 year   Medication Adjustments/Labs and Tests Ordered: Current medicines are reviewed at length with the patient today.  Concerns regarding medicines are outlined above.  No orders of the defined types were placed in this encounter.  No orders of the defined types were placed in this encounter.   Patient Instructions  Medication Instructions:  Your physician recommends that you continue on your current medications as directed. Please refer to the Current Medication list given to you today.  *If you need a refill on your cardiac medications before your next appointment, please call your pharmacy*   Lab Work: None If you have labs (blood work) drawn today and your tests are completely normal, you will receive your results only by: [place] (if you have MyChart) OR A paper copy in the mail If you have any lab test that is abnormal or we need to change your treatment, we will call you to review the results.   Testing/Procedures: None   Follow-Up: At [facility], you and your health needs are our priority.  As part of our  continuing mission to provide you with exceptional heart care, we have created designated Provider Care Teams.  These Care Teams include your primary Cardiologist (physician) and Advanced Practice Providers (APPs -  Physician Assistants and Nurse Practitioners) who all work together to provide you with the care you need, when you need it.  We recommend signing up for the patient portal called "MyChart".  Sign up information is provided on this After Visit Summary.  MyChart is used to connect with patients for Virtual Visits (Telemedicine).  Patients are able to view lab/test results, encounter  notes, upcoming appointments, etc.  Non-urgent messages can be sent to your provider as well.   To learn more about what you can do with MyChart, go to [email or web address].    Your next appointment:   1 year(s)  The format for your next appointment:   In Person    Other Instructions    Adopting a Healthy Lifestyle.  Know what a healthy weight is for you (roughly BMI <25) and aim to maintain this   Aim for 7+ servings of fruits and vegetables daily   65-80+ fluid ounces of water or unsweet tea for healthy kidneys   Limit to max 1 drink of alcohol per day; avoid smoking/tobacco   Limit animal fats in diet for cholesterol and heart health - choose grass fed whenever available   Avoid highly processed foods, and foods high in saturated/trans fats   Aim for low stress - take time to unwind and care for your mental health   Aim for 150 min of moderate intensity exercise weekly for heart health, and weights twice weekly for bone health   Aim for 7-9 hours of sleep daily   When it comes to diets, agreement about the perfect plan isnt easy to find, even among the experts. Experts at the [employer] developed an idea known as the Healthy Eating Plate. Just imagine a plate divided into logical, healthy portions.   The emphasis is on diet quality:   Load up on vegetables  and fruits - one-half of your plate: Aim for color and variety, and remember that potatoes dont count.   Go for whole grains - one-quarter of your plate: Whole wheat, barley, wheat berries, quinoa, oats, brown rice, and foods made with them. If you want pasta, go with whole wheat pasta.   Protein power - one-quarter of your plate: Fish, chicken, beans, and nuts are all healthy, versatile protein sources. Limit red meat.   The diet, however, does go beyond the plate, offering a few other suggestions.   Use healthy plant oils, such as olive, canola, soy, corn, sunflower and peanut. Check the labels, and avoid partially hydrogenated oil, which have unhealthy trans fats.   If youre thirsty, drink water. Coffee and tea are good in moderation, but skip sugary drinks and limit milk and dairy products to one or two daily servings.   The type of carbohydrate in the diet is more important than the amount. Some sources of carbohydrates, such as vegetables, fruits, whole grains, and beans-are healthier than others.   Finally, stay active  Signed, Berniece Salines, DO  11/17/2020 12:07 PM    [place]

## 2020-11-17 NOTE — Patient Instructions (Signed)
Medication Instructions:  Your physician recommends that you continue on your current medications as directed. Please refer to the Current Medication list given to you today.  *If you need a refill on your cardiac medications before your next appointment, please call your pharmacy*   Lab Work: None If you have labs (blood work) drawn today and your tests are completely normal, you will receive your results only by: [place] (if you have MyChart) OR A paper copy in the mail If you have any lab test that is abnormal or we need to change your treatment, we will call you to review the results.   Testing/Procedures: None   Follow-Up: At [facility], you and your health needs are our priority.  As part of our continuing mission to provide you with exceptional heart care, we have created designated Provider Care Teams.  These Care Teams include your primary Cardiologist (physician) and Advanced Practice Providers (APPs -  Physician Assistants and Nurse Practitioners) who all work together to provide you with the care you need, when you need it.  We recommend signing up for the patient portal called "MyChart".  Sign up information is provided on this After Visit Summary.  MyChart is used to connect with patients for Virtual Visits (Telemedicine).  Patients are able to view lab/test results, encounter notes, upcoming appointments, etc.  Non-urgent messages can be sent to your provider as well.   To learn more about what you can do with MyChart, go to [email or web address].    Your next appointment:   1 year(s)  The format for your next appointment:   In Person    Other Instructions

## 2020-11-25 DIAGNOSIS — Z87898 Personal history of other specified conditions: Secondary | ICD-10-CM | POA: Diagnosis not present

## 2020-11-25 DIAGNOSIS — Z85820 Personal history of malignant melanoma of skin: Secondary | ICD-10-CM | POA: Diagnosis not present

## 2020-11-25 DIAGNOSIS — L821 Other seborrheic keratosis: Secondary | ICD-10-CM | POA: Diagnosis not present

## 2020-11-25 DIAGNOSIS — L814 Other melanin hyperpigmentation: Secondary | ICD-10-CM | POA: Diagnosis not present

## 2020-11-25 DIAGNOSIS — L720 Epidermal cyst: Secondary | ICD-10-CM | POA: Diagnosis not present

## 2020-11-25 DIAGNOSIS — L304 Erythema intertrigo: Secondary | ICD-10-CM | POA: Diagnosis not present

## 2020-11-25 DIAGNOSIS — L7211 Pilar cyst: Secondary | ICD-10-CM | POA: Diagnosis not present

## 2021-01-28 DIAGNOSIS — M170 Bilateral primary osteoarthritis of knee: Secondary | ICD-10-CM | POA: Diagnosis not present

## 2021-02-01 ENCOUNTER — Other Ambulatory Visit: Payer: Self-pay | Admitting: Cardiology

## 2021-02-04 DIAGNOSIS — M1711 Unilateral primary osteoarthritis, right knee: Secondary | ICD-10-CM | POA: Diagnosis not present

## 2021-02-04 DIAGNOSIS — M170 Bilateral primary osteoarthritis of knee: Secondary | ICD-10-CM | POA: Diagnosis not present

## 2021-02-04 DIAGNOSIS — M1712 Unilateral primary osteoarthritis, left knee: Secondary | ICD-10-CM | POA: Diagnosis not present

## 2021-02-05 DIAGNOSIS — N3945 Continuous leakage: Secondary | ICD-10-CM | POA: Diagnosis not present

## 2021-02-05 DIAGNOSIS — I480 Paroxysmal atrial fibrillation: Secondary | ICD-10-CM | POA: Diagnosis not present

## 2021-02-05 DIAGNOSIS — F4321 Adjustment disorder with depressed mood: Secondary | ICD-10-CM | POA: Diagnosis not present

## 2021-02-05 DIAGNOSIS — I422 Other hypertrophic cardiomyopathy: Secondary | ICD-10-CM | POA: Diagnosis not present

## 2021-02-05 DIAGNOSIS — R2681 Unsteadiness on feet: Secondary | ICD-10-CM | POA: Diagnosis not present

## 2021-02-11 DIAGNOSIS — M170 Bilateral primary osteoarthritis of knee: Secondary | ICD-10-CM | POA: Diagnosis not present

## 2021-02-11 DIAGNOSIS — M1711 Unilateral primary osteoarthritis, right knee: Secondary | ICD-10-CM | POA: Diagnosis not present

## 2021-02-11 DIAGNOSIS — M1712 Unilateral primary osteoarthritis, left knee: Secondary | ICD-10-CM | POA: Diagnosis not present

## 2021-02-23 DIAGNOSIS — N3281 Overactive bladder: Secondary | ICD-10-CM | POA: Diagnosis not present

## 2021-02-23 DIAGNOSIS — N3946 Mixed incontinence: Secondary | ICD-10-CM | POA: Diagnosis not present

## 2021-02-23 DIAGNOSIS — Z79899 Other long term (current) drug therapy: Secondary | ICD-10-CM | POA: Diagnosis not present

## 2021-02-23 DIAGNOSIS — N952 Postmenopausal atrophic vaginitis: Secondary | ICD-10-CM | POA: Diagnosis not present

## 2021-03-02 DIAGNOSIS — M25612 Stiffness of left shoulder, not elsewhere classified: Secondary | ICD-10-CM | POA: Diagnosis not present

## 2021-03-02 DIAGNOSIS — M25662 Stiffness of left knee, not elsewhere classified: Secondary | ICD-10-CM | POA: Diagnosis not present

## 2021-03-02 DIAGNOSIS — M2560 Stiffness of unspecified joint, not elsewhere classified: Secondary | ICD-10-CM | POA: Diagnosis not present

## 2021-03-02 DIAGNOSIS — M6281 Muscle weakness (generalized): Secondary | ICD-10-CM | POA: Diagnosis not present

## 2021-03-02 DIAGNOSIS — Z20822 Contact with and (suspected) exposure to covid-19: Secondary | ICD-10-CM | POA: Diagnosis not present

## 2021-03-02 DIAGNOSIS — M542 Cervicalgia: Secondary | ICD-10-CM | POA: Diagnosis not present

## 2021-03-02 DIAGNOSIS — M25611 Stiffness of right shoulder, not elsewhere classified: Secondary | ICD-10-CM | POA: Diagnosis not present

## 2021-03-02 DIAGNOSIS — M25661 Stiffness of right knee, not elsewhere classified: Secondary | ICD-10-CM | POA: Diagnosis not present

## 2021-03-02 DIAGNOSIS — M25562 Pain in left knee: Secondary | ICD-10-CM | POA: Diagnosis not present

## 2021-03-02 DIAGNOSIS — M25561 Pain in right knee: Secondary | ICD-10-CM | POA: Diagnosis not present

## 2021-03-02 DIAGNOSIS — R296 Repeated falls: Secondary | ICD-10-CM | POA: Diagnosis not present

## 2021-03-02 DIAGNOSIS — R2681 Unsteadiness on feet: Secondary | ICD-10-CM | POA: Diagnosis not present

## 2021-03-05 DIAGNOSIS — M25562 Pain in left knee: Secondary | ICD-10-CM | POA: Diagnosis not present

## 2021-03-05 DIAGNOSIS — R296 Repeated falls: Secondary | ICD-10-CM | POA: Diagnosis not present

## 2021-03-05 DIAGNOSIS — R2681 Unsteadiness on feet: Secondary | ICD-10-CM | POA: Diagnosis not present

## 2021-03-05 DIAGNOSIS — M25661 Stiffness of right knee, not elsewhere classified: Secondary | ICD-10-CM | POA: Diagnosis not present

## 2021-03-05 DIAGNOSIS — M6281 Muscle weakness (generalized): Secondary | ICD-10-CM | POA: Diagnosis not present

## 2021-03-05 DIAGNOSIS — M25561 Pain in right knee: Secondary | ICD-10-CM | POA: Diagnosis not present

## 2021-03-06 DIAGNOSIS — Z20822 Contact with and (suspected) exposure to covid-19: Secondary | ICD-10-CM | POA: Diagnosis not present

## 2021-03-08 DIAGNOSIS — M6281 Muscle weakness (generalized): Secondary | ICD-10-CM | POA: Diagnosis not present

## 2021-03-08 DIAGNOSIS — M25661 Stiffness of right knee, not elsewhere classified: Secondary | ICD-10-CM | POA: Diagnosis not present

## 2021-03-08 DIAGNOSIS — M542 Cervicalgia: Secondary | ICD-10-CM | POA: Diagnosis not present

## 2021-03-08 DIAGNOSIS — M25611 Stiffness of right shoulder, not elsewhere classified: Secondary | ICD-10-CM | POA: Diagnosis not present

## 2021-03-08 DIAGNOSIS — M2560 Stiffness of unspecified joint, not elsewhere classified: Secondary | ICD-10-CM | POA: Diagnosis not present

## 2021-03-08 DIAGNOSIS — R296 Repeated falls: Secondary | ICD-10-CM | POA: Diagnosis not present

## 2021-03-08 DIAGNOSIS — M25561 Pain in right knee: Secondary | ICD-10-CM | POA: Diagnosis not present

## 2021-03-08 DIAGNOSIS — R2681 Unsteadiness on feet: Secondary | ICD-10-CM | POA: Diagnosis not present

## 2021-03-08 DIAGNOSIS — M25662 Stiffness of left knee, not elsewhere classified: Secondary | ICD-10-CM | POA: Diagnosis not present

## 2021-03-08 DIAGNOSIS — M25612 Stiffness of left shoulder, not elsewhere classified: Secondary | ICD-10-CM | POA: Diagnosis not present

## 2021-03-12 DIAGNOSIS — R058 Other specified cough: Secondary | ICD-10-CM | POA: Diagnosis not present

## 2021-03-12 DIAGNOSIS — Z2889 Immunization not carried out for other reason: Secondary | ICD-10-CM | POA: Diagnosis not present

## 2021-03-12 DIAGNOSIS — M25561 Pain in right knee: Secondary | ICD-10-CM | POA: Diagnosis not present

## 2021-03-12 DIAGNOSIS — R296 Repeated falls: Secondary | ICD-10-CM | POA: Diagnosis not present

## 2021-03-12 DIAGNOSIS — M25662 Stiffness of left knee, not elsewhere classified: Secondary | ICD-10-CM | POA: Diagnosis not present

## 2021-03-12 DIAGNOSIS — M25661 Stiffness of right knee, not elsewhere classified: Secondary | ICD-10-CM | POA: Diagnosis not present

## 2021-03-12 DIAGNOSIS — R0982 Postnasal drip: Secondary | ICD-10-CM | POA: Diagnosis not present

## 2021-03-12 DIAGNOSIS — R2681 Unsteadiness on feet: Secondary | ICD-10-CM | POA: Diagnosis not present

## 2021-03-12 DIAGNOSIS — J309 Allergic rhinitis, unspecified: Secondary | ICD-10-CM | POA: Diagnosis not present

## 2021-03-12 DIAGNOSIS — M6281 Muscle weakness (generalized): Secondary | ICD-10-CM | POA: Diagnosis not present

## 2021-03-15 DIAGNOSIS — M25561 Pain in right knee: Secondary | ICD-10-CM | POA: Diagnosis not present

## 2021-03-15 DIAGNOSIS — R296 Repeated falls: Secondary | ICD-10-CM | POA: Diagnosis not present

## 2021-03-15 DIAGNOSIS — M25662 Stiffness of left knee, not elsewhere classified: Secondary | ICD-10-CM | POA: Diagnosis not present

## 2021-03-15 DIAGNOSIS — M6281 Muscle weakness (generalized): Secondary | ICD-10-CM | POA: Diagnosis not present

## 2021-03-15 DIAGNOSIS — M25661 Stiffness of right knee, not elsewhere classified: Secondary | ICD-10-CM | POA: Diagnosis not present

## 2021-03-15 DIAGNOSIS — R2681 Unsteadiness on feet: Secondary | ICD-10-CM | POA: Diagnosis not present

## 2021-03-18 DIAGNOSIS — M25662 Stiffness of left knee, not elsewhere classified: Secondary | ICD-10-CM | POA: Diagnosis not present

## 2021-03-18 DIAGNOSIS — M25661 Stiffness of right knee, not elsewhere classified: Secondary | ICD-10-CM | POA: Diagnosis not present

## 2021-03-18 DIAGNOSIS — M25561 Pain in right knee: Secondary | ICD-10-CM | POA: Diagnosis not present

## 2021-03-18 DIAGNOSIS — R2681 Unsteadiness on feet: Secondary | ICD-10-CM | POA: Diagnosis not present

## 2021-03-18 DIAGNOSIS — R296 Repeated falls: Secondary | ICD-10-CM | POA: Diagnosis not present

## 2021-03-18 DIAGNOSIS — M6281 Muscle weakness (generalized): Secondary | ICD-10-CM | POA: Diagnosis not present

## 2021-03-22 DIAGNOSIS — M25561 Pain in right knee: Secondary | ICD-10-CM | POA: Diagnosis not present

## 2021-03-22 DIAGNOSIS — R296 Repeated falls: Secondary | ICD-10-CM | POA: Diagnosis not present

## 2021-03-22 DIAGNOSIS — N952 Postmenopausal atrophic vaginitis: Secondary | ICD-10-CM | POA: Diagnosis not present

## 2021-03-22 DIAGNOSIS — M6281 Muscle weakness (generalized): Secondary | ICD-10-CM | POA: Diagnosis not present

## 2021-03-22 DIAGNOSIS — M25662 Stiffness of left knee, not elsewhere classified: Secondary | ICD-10-CM | POA: Diagnosis not present

## 2021-03-22 DIAGNOSIS — M25661 Stiffness of right knee, not elsewhere classified: Secondary | ICD-10-CM | POA: Diagnosis not present

## 2021-03-22 DIAGNOSIS — N3946 Mixed incontinence: Secondary | ICD-10-CM | POA: Diagnosis not present

## 2021-03-22 DIAGNOSIS — R2681 Unsteadiness on feet: Secondary | ICD-10-CM | POA: Diagnosis not present

## 2021-03-22 DIAGNOSIS — N3281 Overactive bladder: Secondary | ICD-10-CM | POA: Diagnosis not present

## 2021-03-26 DIAGNOSIS — M25661 Stiffness of right knee, not elsewhere classified: Secondary | ICD-10-CM | POA: Diagnosis not present

## 2021-03-26 DIAGNOSIS — M6281 Muscle weakness (generalized): Secondary | ICD-10-CM | POA: Diagnosis not present

## 2021-03-26 DIAGNOSIS — M25561 Pain in right knee: Secondary | ICD-10-CM | POA: Diagnosis not present

## 2021-03-26 DIAGNOSIS — R2681 Unsteadiness on feet: Secondary | ICD-10-CM | POA: Diagnosis not present

## 2021-03-26 DIAGNOSIS — R296 Repeated falls: Secondary | ICD-10-CM | POA: Diagnosis not present

## 2021-03-26 DIAGNOSIS — M25662 Stiffness of left knee, not elsewhere classified: Secondary | ICD-10-CM | POA: Diagnosis not present

## 2021-04-06 DIAGNOSIS — Z20822 Contact with and (suspected) exposure to covid-19: Secondary | ICD-10-CM | POA: Diagnosis not present

## 2021-04-14 DIAGNOSIS — M25612 Stiffness of left shoulder, not elsewhere classified: Secondary | ICD-10-CM | POA: Diagnosis not present

## 2021-04-14 DIAGNOSIS — M25561 Pain in right knee: Secondary | ICD-10-CM | POA: Diagnosis not present

## 2021-04-14 DIAGNOSIS — R296 Repeated falls: Secondary | ICD-10-CM | POA: Diagnosis not present

## 2021-04-14 DIAGNOSIS — M25562 Pain in left knee: Secondary | ICD-10-CM | POA: Diagnosis not present

## 2021-04-14 DIAGNOSIS — R2681 Unsteadiness on feet: Secondary | ICD-10-CM | POA: Diagnosis not present

## 2021-04-14 DIAGNOSIS — M6281 Muscle weakness (generalized): Secondary | ICD-10-CM | POA: Diagnosis not present

## 2021-04-14 DIAGNOSIS — M25611 Stiffness of right shoulder, not elsewhere classified: Secondary | ICD-10-CM | POA: Diagnosis not present

## 2021-04-14 DIAGNOSIS — M25661 Stiffness of right knee, not elsewhere classified: Secondary | ICD-10-CM | POA: Diagnosis not present

## 2021-04-14 DIAGNOSIS — M25662 Stiffness of left knee, not elsewhere classified: Secondary | ICD-10-CM | POA: Diagnosis not present

## 2021-04-14 DIAGNOSIS — M2560 Stiffness of unspecified joint, not elsewhere classified: Secondary | ICD-10-CM | POA: Diagnosis not present

## 2021-04-14 DIAGNOSIS — M542 Cervicalgia: Secondary | ICD-10-CM | POA: Diagnosis not present

## 2021-04-16 DIAGNOSIS — M25561 Pain in right knee: Secondary | ICD-10-CM | POA: Diagnosis not present

## 2021-04-16 DIAGNOSIS — M25661 Stiffness of right knee, not elsewhere classified: Secondary | ICD-10-CM | POA: Diagnosis not present

## 2021-04-16 DIAGNOSIS — M25562 Pain in left knee: Secondary | ICD-10-CM | POA: Diagnosis not present

## 2021-04-16 DIAGNOSIS — M6281 Muscle weakness (generalized): Secondary | ICD-10-CM | POA: Diagnosis not present

## 2021-04-16 DIAGNOSIS — R2681 Unsteadiness on feet: Secondary | ICD-10-CM | POA: Diagnosis not present

## 2021-04-16 DIAGNOSIS — R296 Repeated falls: Secondary | ICD-10-CM | POA: Diagnosis not present

## 2021-04-19 DIAGNOSIS — M25661 Stiffness of right knee, not elsewhere classified: Secondary | ICD-10-CM | POA: Diagnosis not present

## 2021-04-19 DIAGNOSIS — M25561 Pain in right knee: Secondary | ICD-10-CM | POA: Diagnosis not present

## 2021-04-19 DIAGNOSIS — R2681 Unsteadiness on feet: Secondary | ICD-10-CM | POA: Diagnosis not present

## 2021-04-19 DIAGNOSIS — R296 Repeated falls: Secondary | ICD-10-CM | POA: Diagnosis not present

## 2021-04-19 DIAGNOSIS — M25562 Pain in left knee: Secondary | ICD-10-CM | POA: Diagnosis not present

## 2021-04-19 DIAGNOSIS — M6281 Muscle weakness (generalized): Secondary | ICD-10-CM | POA: Diagnosis not present

## 2021-04-22 DIAGNOSIS — M25561 Pain in right knee: Secondary | ICD-10-CM | POA: Diagnosis not present

## 2021-04-22 DIAGNOSIS — M25562 Pain in left knee: Secondary | ICD-10-CM | POA: Diagnosis not present

## 2021-04-22 DIAGNOSIS — M6281 Muscle weakness (generalized): Secondary | ICD-10-CM | POA: Diagnosis not present

## 2021-04-22 DIAGNOSIS — M25661 Stiffness of right knee, not elsewhere classified: Secondary | ICD-10-CM | POA: Diagnosis not present

## 2021-04-22 DIAGNOSIS — R2681 Unsteadiness on feet: Secondary | ICD-10-CM | POA: Diagnosis not present

## 2021-04-22 DIAGNOSIS — R296 Repeated falls: Secondary | ICD-10-CM | POA: Diagnosis not present

## 2021-04-26 DIAGNOSIS — M25562 Pain in left knee: Secondary | ICD-10-CM | POA: Diagnosis not present

## 2021-04-26 DIAGNOSIS — R2681 Unsteadiness on feet: Secondary | ICD-10-CM | POA: Diagnosis not present

## 2021-04-26 DIAGNOSIS — R296 Repeated falls: Secondary | ICD-10-CM | POA: Diagnosis not present

## 2021-04-26 DIAGNOSIS — M25561 Pain in right knee: Secondary | ICD-10-CM | POA: Diagnosis not present

## 2021-04-26 DIAGNOSIS — M25661 Stiffness of right knee, not elsewhere classified: Secondary | ICD-10-CM | POA: Diagnosis not present

## 2021-04-26 DIAGNOSIS — M6281 Muscle weakness (generalized): Secondary | ICD-10-CM | POA: Diagnosis not present

## 2021-05-06 DIAGNOSIS — N3946 Mixed incontinence: Secondary | ICD-10-CM | POA: Diagnosis not present

## 2021-05-06 DIAGNOSIS — R350 Frequency of micturition: Secondary | ICD-10-CM | POA: Diagnosis not present

## 2021-05-06 DIAGNOSIS — Z20822 Contact with and (suspected) exposure to covid-19: Secondary | ICD-10-CM | POA: Diagnosis not present

## 2021-05-10 DIAGNOSIS — I480 Paroxysmal atrial fibrillation: Secondary | ICD-10-CM | POA: Diagnosis not present

## 2021-05-10 DIAGNOSIS — N1832 Chronic kidney disease, stage 3b: Secondary | ICD-10-CM | POA: Diagnosis not present

## 2021-05-10 DIAGNOSIS — Z79899 Other long term (current) drug therapy: Secondary | ICD-10-CM | POA: Diagnosis not present

## 2021-05-10 DIAGNOSIS — I422 Other hypertrophic cardiomyopathy: Secondary | ICD-10-CM | POA: Diagnosis not present

## 2021-05-10 DIAGNOSIS — Z0000 Encounter for general adult medical examination without abnormal findings: Secondary | ICD-10-CM | POA: Diagnosis not present

## 2021-05-10 DIAGNOSIS — Z780 Asymptomatic menopausal state: Secondary | ICD-10-CM | POA: Diagnosis not present

## 2021-05-10 DIAGNOSIS — K219 Gastro-esophageal reflux disease without esophagitis: Secondary | ICD-10-CM | POA: Diagnosis not present

## 2021-06-06 DIAGNOSIS — Z20822 Contact with and (suspected) exposure to covid-19: Secondary | ICD-10-CM | POA: Diagnosis not present

## 2021-06-08 DIAGNOSIS — L814 Other melanin hyperpigmentation: Secondary | ICD-10-CM | POA: Diagnosis not present

## 2021-06-08 DIAGNOSIS — L720 Epidermal cyst: Secondary | ICD-10-CM | POA: Diagnosis not present

## 2021-06-08 DIAGNOSIS — Z87898 Personal history of other specified conditions: Secondary | ICD-10-CM | POA: Diagnosis not present

## 2021-06-08 DIAGNOSIS — L821 Other seborrheic keratosis: Secondary | ICD-10-CM | POA: Diagnosis not present

## 2021-06-08 DIAGNOSIS — L304 Erythema intertrigo: Secondary | ICD-10-CM | POA: Diagnosis not present

## 2021-06-08 DIAGNOSIS — Z85820 Personal history of malignant melanoma of skin: Secondary | ICD-10-CM | POA: Diagnosis not present

## 2021-06-08 DIAGNOSIS — D229 Melanocytic nevi, unspecified: Secondary | ICD-10-CM | POA: Diagnosis not present

## 2021-06-08 DIAGNOSIS — D485 Neoplasm of uncertain behavior of skin: Secondary | ICD-10-CM | POA: Diagnosis not present

## 2021-06-09 ENCOUNTER — Other Ambulatory Visit: Payer: Self-pay | Admitting: Cardiology

## 2021-06-09 DIAGNOSIS — R350 Frequency of micturition: Secondary | ICD-10-CM | POA: Diagnosis not present

## 2021-06-09 DIAGNOSIS — N3946 Mixed incontinence: Secondary | ICD-10-CM | POA: Diagnosis not present

## 2021-06-16 DIAGNOSIS — N3946 Mixed incontinence: Secondary | ICD-10-CM | POA: Diagnosis not present

## 2021-06-16 DIAGNOSIS — R350 Frequency of micturition: Secondary | ICD-10-CM | POA: Diagnosis not present

## 2021-07-06 DIAGNOSIS — I10 Essential (primary) hypertension: Secondary | ICD-10-CM | POA: Diagnosis not present

## 2021-07-06 DIAGNOSIS — E782 Mixed hyperlipidemia: Secondary | ICD-10-CM | POA: Diagnosis not present

## 2021-07-31 DIAGNOSIS — I10 Essential (primary) hypertension: Secondary | ICD-10-CM | POA: Diagnosis not present

## 2021-07-31 DIAGNOSIS — R051 Acute cough: Secondary | ICD-10-CM | POA: Diagnosis not present

## 2021-07-31 DIAGNOSIS — J189 Pneumonia, unspecified organism: Secondary | ICD-10-CM | POA: Diagnosis not present

## 2021-07-31 DIAGNOSIS — J069 Acute upper respiratory infection, unspecified: Secondary | ICD-10-CM | POA: Diagnosis not present

## 2021-08-03 DIAGNOSIS — E782 Mixed hyperlipidemia: Secondary | ICD-10-CM | POA: Diagnosis not present

## 2021-08-03 DIAGNOSIS — C44729 Squamous cell carcinoma of skin of left lower limb, including hip: Secondary | ICD-10-CM | POA: Diagnosis not present

## 2021-08-03 DIAGNOSIS — I10 Essential (primary) hypertension: Secondary | ICD-10-CM | POA: Diagnosis not present

## 2021-08-04 DIAGNOSIS — I10 Essential (primary) hypertension: Secondary | ICD-10-CM | POA: Diagnosis not present

## 2021-08-04 DIAGNOSIS — J40 Bronchitis, not specified as acute or chronic: Secondary | ICD-10-CM | POA: Diagnosis not present

## 2021-08-04 DIAGNOSIS — Z6829 Body mass index (BMI) 29.0-29.9, adult: Secondary | ICD-10-CM | POA: Diagnosis not present

## 2021-08-04 DIAGNOSIS — J329 Chronic sinusitis, unspecified: Secondary | ICD-10-CM | POA: Diagnosis not present

## 2021-08-09 DIAGNOSIS — J309 Allergic rhinitis, unspecified: Secondary | ICD-10-CM | POA: Diagnosis not present

## 2021-08-09 DIAGNOSIS — J329 Chronic sinusitis, unspecified: Secondary | ICD-10-CM | POA: Diagnosis not present

## 2021-08-09 DIAGNOSIS — R0982 Postnasal drip: Secondary | ICD-10-CM | POA: Diagnosis not present

## 2021-08-09 DIAGNOSIS — R058 Other specified cough: Secondary | ICD-10-CM | POA: Diagnosis not present

## 2021-08-26 DIAGNOSIS — Z1231 Encounter for screening mammogram for malignant neoplasm of breast: Secondary | ICD-10-CM | POA: Diagnosis not present

## 2021-08-27 DIAGNOSIS — R220 Localized swelling, mass and lump, head: Secondary | ICD-10-CM | POA: Diagnosis not present

## 2021-08-31 ENCOUNTER — Telehealth: Payer: Self-pay | Admitting: Cardiology

## 2021-08-31 NOTE — Telephone Encounter (Signed)
? ?  Pre-operative Risk Assessment  ?  ?Patient Name: Monica Anderson  ?DOB: [birth year] ?MRN: [id number]  ? ?  ? ?Request for Surgical Clearance   ? ?Procedure:   Implant in Ankle for research ? ?Date of Surgery:  Clearance 09/21/21                              ?   ?Surgeon:  Dr. Matilde Sprang ?Surgeon's Group or Practice Name:  [facility]  ?Phone number:  [phone] ?Fax number:  [phone] ?  ?Type of Clearance Requested:   ?- Pharmacy:  Hold Apixaban (Eliquis) 2 days prior, but TBD by Cardiology ?  ?Type of Anesthesia:  Local  ?  ?Additional requests/questions:   Procedure is being done right in the office at [facility] ? ?Signed, ?Trilby Drummer   ?08/31/2021, 2:34 PM   ?

## 2021-08-31 NOTE — Telephone Encounter (Signed)
Will route to pharm for input on anticoag. Last OV 11/2020, PAF. ?

## 2021-08-31 NOTE — Telephone Encounter (Signed)
Patient with diagnosis of afib on Eliquis for anticoagulation.   ? ?Procedure: Implant in Ankle for research ?Date of procedure: 09/21/21 ? ?CHA2DS2-VASc Score = 4  ?This indicates a 4.8% annual risk of stroke. ?The patient's score is based upon: ?CHF History: 0 ?HTN History: 1 ?Diabetes History: 0 ?Stroke History: 0 ?Vascular Disease History: 0 ?Age Score: 2 ?Gender Score: 1 ? ?Labs per [facility] (05/10/21):  ?CrCl 53 mL/min ?Platelet count 215K ? ?Per office protocol, patient can hold Eliquis for 2 days prior to procedure.   ? ?

## 2021-09-01 NOTE — Telephone Encounter (Signed)
Please set up a telehealth preop visit for cardiac clearance ?

## 2021-09-02 ENCOUNTER — Ambulatory Visit (INDEPENDENT_AMBULATORY_CARE_PROVIDER_SITE_OTHER): Payer: Medicare Other | Admitting: General Practice

## 2021-09-02 ENCOUNTER — Telehealth: Payer: Self-pay | Admitting: *Deleted

## 2021-09-02 DIAGNOSIS — Z01810 Encounter for preprocedural cardiovascular examination: Secondary | ICD-10-CM | POA: Diagnosis not present

## 2021-09-02 NOTE — Telephone Encounter (Signed)
Pt is agreeable to plan of care for tele pre op appt today at 3:40. Med rec and consent have been done.  ? ?  ?Patient Consent for Virtual Visit  ? ? ?   ? ?Monica Anderson has provided verbal consent on 09/02/2021 for a virtual visit (video or telephone). ? ? ?CONSENT FOR VIRTUAL VISIT FOR:  Monica Anderson  ?By participating in this virtual visit I agree to the following: ? ?I hereby voluntarily request, consent and authorize [facility] and its employed or contracted physicians, physician assistants, nurse practitioners or other licensed health care professionals (the Practitioner), to provide me with telemedicine health care services (the ?Services") as deemed necessary by the treating Practitioner. I acknowledge and consent to receive the Services by the Practitioner via telemedicine. I understand that the telemedicine visit will involve communicating with the Practitioner through live audiovisual communication technology and the disclosure of certain medical information by electronic transmission. I acknowledge that I have been given the opportunity to request an in-person assessment or other available alternative prior to the telemedicine visit and am voluntarily participating in the telemedicine visit. ? ?I understand that I have the right to withhold or withdraw my consent to the use of telemedicine in the course of my care at any time, without affecting my right to future care or treatment, and that the Practitioner or I may terminate the telemedicine visit at any time. I understand that I have the right to inspect all information obtained and/or recorded in the course of the telemedicine visit and may receive copies of available information for a reasonable fee.  I understand that some of the potential risks of receiving the Services via telemedicine include:  ?Delay or interruption in medical evaluation due to technological equipment failure or disruption; ?Information transmitted may not be sufficient  (e.g. poor resolution of images) to allow for appropriate medical decision making by the Practitioner; and/or  ?In rare instances, security protocols could fail, causing a breach of personal health information. ? ?Furthermore, I acknowledge that it is my responsibility to provide information about my medical history, conditions and care that is complete and accurate to the best of my ability. I acknowledge that Practitioner's advice, recommendations, and/or decision may be based on factors not within their control, such as incomplete or inaccurate data provided by me or distortions of diagnostic images or specimens that may result from electronic transmissions. I understand that the practice of medicine is not an exact science and that Practitioner makes no warranties or guarantees regarding treatment outcomes. I acknowledge that a copy of this consent can be made available to me via my patient portal ([place]), or I can request a printed copy by calling the office of [facility] [place].   ? ?I understand that my insurance will be billed for this visit.  ? ?I have read or had this consent read to me. ?I understand the contents of this consent, which adequately explains the benefits and risks of the Services being provided via telemedicine.  ?I have been provided ample opportunity to ask questions regarding this consent and the Services and have had my questions answered to my satisfaction. ?I give my informed consent for the services to be provided through the use of telemedicine in my medical care ? ? ? ?

## 2021-09-02 NOTE — Telephone Encounter (Signed)
I left a message for pt to call back for tele pre op appt. I tried the primary # but vm not taking messages. I left a message on alternate # on file.  ?

## 2021-09-02 NOTE — Telephone Encounter (Signed)
Patient was returning call. Please advise ?

## 2021-09-02 NOTE — Telephone Encounter (Signed)
Pt is agreeable to plan of care for tele pre op appt today at 3:40. Med rec and consent have been done.  ?  ?

## 2021-09-02 NOTE — Progress Notes (Signed)
? ?Virtual Visit via Telephone Note  ? ?This visit type was conducted due to national recommendations for restrictions regarding the COVID-19 Pandemic (e.g. social distancing) in an effort to limit this patient's exposure and mitigate transmission in our community.  Due to her co-morbid illnesses, this patient is at least at moderate risk for complications without adequate follow up.  This format is felt to be most appropriate for this patient at this time.  The patient did not have access to video technology/had technical difficulties with video requiring transitioning to audio format only (telephone).  All issues noted in this document were discussed and addressed.  No physical exam could be performed with this format.  Please refer to the patient's chart for her  consent to telehealth for [facility]. ? ?Evaluation Performed:  Preoperative cardiovascular risk assessment ?_____________  ? ?Date:  09/02/2021  ? ?Patient ID:  Monica, Anderson [birth year], MRN [id number] ?Patient Location:  ?Home ?Provider location:   ?Office ? ?Primary Care Provider:  Serita Grammes, MD ?Primary Cardiologist:  Berniece Salines, DO ? ?Chief Complaint  ?  ?76 y.o. y/o female with a h/o hypertrophic cardiomyopathy, essential hypertension, NSVT, paroxysmal atrial tachycardia, and paroxysmal atrial fibrillation, who is pending ankle implant, and presents today for telephonic preoperative cardiovascular risk assessment. ? ?Past Medical History  ?  ?Past Medical History:  ?Diagnosis Date  ? Asymmetrical left sensorineural hearing loss 10/01/2019  ? Atrial fibrillation ([place]) 10/02/2019  ? Essential hypertension 10/03/2019  ? History of appendectomy 10/02/2019  ? History of atypical nevus 12/14/2016  ? History of malignant melanoma of skin 06/16/2017  ? 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, left ([place]) 01/17/2017  ? Meniere's disease 08/29/2019  ? Mixed hyperlipidemia 10/03/2019  ? Tubal ligation evaluation 10/02/2019   ? ?Past Surgical History:  ?Procedure Laterality Date  ? BREAST BIOPSY  1973  ? CATARACT EXTRACTION, BILATERAL  2019  ? MOHS SURGERY Left 2019  ? Melanoma on left ear  ? TUBAL LIGATION  1977  ? ? ?Allergies ? ?No Known Allergies ? ?History of Present Illness  ?  ?Monica Anderson is a 76 y.o. female who presents via audio/video conferencing for a telehealth visit today.  Pt was last seen in cardiology clinic on 11/17/2020 by Dr. Harriet Masson.  At that time Monica Anderson was doing well .  The patient is now pending ankle implant.  Since her last visit, she remained stable from a cardiac standpoint. ? ?Today she  denies chest pain, shortness of breath, lower extremity edema, fatigue, palpitations, melena, hematuria, hemoptysis, diaphoresis, weakness, presyncope, syncope, orthopnea, and PND. ? ? ? ?Home Medications  ?  ?Prior to Admission medications   ?Medication Sig Start Date End Date Taking? Authorizing Provider  ?B Complex-C-Folic Acid (HM SUPER VITAMIN B COMPLEX/C) TABS Take 1 tablet by mouth daily. ?Patient not taking: Reported on 09/02/2021    [provider]  ?buPROPion (WELLBUTRIN XL) 150 MG 24 hr tablet Take 1 tablet by mouth daily.    [provider]  ?Calcium Carbonate-Vit D-Min (CALCIUM 1200 PO) Take 1,200 mg by mouth daily.    [provider]  ?cholecalciferol (VITAMIN D3) 25 MCG (1000 UNIT) tablet Take 2,000 Units by mouth daily.    [provider]  ?diltiazem (CARDIZEM CD) 180 MG 24 hr capsule TAKE 1 CAPSULE BY MOUTH EVERY DAY 06/09/21   Tobb, Kardie, DO  ?ELIQUIS 5 MG TABS tablet Take 5 mg by mouth 2 (two) times daily. 01/16/18   [provider]  ?famotidine (PEPCID) 20 MG tablet Take 20 mg by mouth at bedtime. 08/09/21   [provider]  ?ketoconazole (NIZORAL) 2 % cream ketoconazole 2 % topical cream ? APPLY TO RASH IN SKIN FOLDS 2 TIMES DAILY X 14 DAYS AS NEEDED    [provider]  ?levocetirizine (XYZAL) 5 MG tablet Take 5 mg by mouth at bedtime as  needed. 06/08/21   [provider]  ?metoprolol tartrate (LOPRESSOR) 100 MG tablet Take 1 tablet (100 mg total) by mouth once for 1 dose. Take 2 hours before ct 02/18/20 02/18/20  Tobb, Kardie, DO  ?montelukast (SINGULAIR) 10 MG tablet Take 10 mg by mouth 3 times/day as needed-between meals & bedtime. 08/09/21   [provider]  ?Multiple Vitamins-Minerals (HEALTHY EYES) TABS Take 1 tablet by mouth daily.    [provider]  ?Omega-3 Fatty Acids (FISH OIL) 1000 MG CAPS Take 1,000 mg by mouth daily.     [provider]  ?oxybutynin (DITROPAN-XL) 5 MG 24 hr tablet Take 5 mg by mouth daily. ?Patient not taking: Reported on 09/02/2021    [provider]  ?rosuvastatin (CRESTOR) 5 MG tablet Take 5 mg by mouth every evening. 12/10/17   [provider]  ?spironolactone-hydrochlorothiazide (ALDACTAZIDE) 25-25 MG tablet Take 0.5 tablets by mouth daily.     [provider]  ? ? ?Physical Exam  ?  ?Vital Signs:  Monica Anderson does not have vital signs available for review today. ? ?Given telephonic nature of communication, physical exam is limited. ?AAOx3. NAD. Normal affect.  Speech and respirations are unlabored. ? ?Accessory Clinical Findings  ?  ?None ? ?Assessment & Plan  ?  ?1.  Preoperative Cardiovascular Risk Assessment: ? ?  ? ?Primary Cardiologist: Berniece Salines, DO ? ?Chart reviewed as part of pre-operative protocol coverage. Given past medical history and time since last visit, based on ACC/AHA guidelines, Monica Anderson would be at acceptable risk for the planned procedure without further cardiovascular testing.  ? ?Patient was advised that if she develops new symptoms prior to surgery to contact our office to arrange a follow-up appointment.  He verbalized understanding. ? ?Her RCRI is a class II risk, 0.9% risk of major cardiac event.  She is able to complete greater than 4 METS of physical activity. ? ?Patient with diagnosis of afib on Eliquis for  anticoagulation.   ?  ?Procedure: Implant in Ankle for research ?Date of procedure: 09/21/21 ?  ?CHA2DS2-VASc Score = 4  ?This indicates a 4.8% annual risk of stroke. ?The patient's score is based upon: ?CHF History: 0 ?HTN History: 1 ?Diabetes History: 0 ?Stroke History: 0 ?Vascular Disease History: 0 ?Age Score: 2 ?Gender Score: 1 ?  ?Labs per [facility] (05/10/21):  ?CrCl 53 mL/min ?Platelet count 215K ?  ?Per office protocol, patient can hold Eliquis for 2 days prior to procedure.   ? ? ?A copy of this note will be routed to requesting surgeon. ? ?Time:   ?Today, I have spent 5 minutes with the patient with telehealth technology discussing medical history, symptoms, and management plan.  I spent greater than 10 minutes prior to the phone evaluation reviewing the patient's past medical history and medications. ? ? ?Deberah Pelton, NP ? ?09/02/2021, 12:36 PM ? ?

## 2021-09-07 DIAGNOSIS — J40 Bronchitis, not specified as acute or chronic: Secondary | ICD-10-CM | POA: Diagnosis not present

## 2021-09-07 DIAGNOSIS — I480 Paroxysmal atrial fibrillation: Secondary | ICD-10-CM | POA: Diagnosis not present

## 2021-09-07 DIAGNOSIS — R920 Mammographic microcalcification found on diagnostic imaging of breast: Secondary | ICD-10-CM | POA: Diagnosis not present

## 2021-09-07 DIAGNOSIS — J329 Chronic sinusitis, unspecified: Secondary | ICD-10-CM | POA: Diagnosis not present

## 2021-09-14 DIAGNOSIS — M170 Bilateral primary osteoarthritis of knee: Secondary | ICD-10-CM | POA: Diagnosis not present

## 2021-10-06 DIAGNOSIS — T8131XD Disruption of external operation (surgical) wound, not elsewhere classified, subsequent encounter: Secondary | ICD-10-CM | POA: Diagnosis not present

## 2021-10-06 DIAGNOSIS — Z6831 Body mass index (BMI) 31.0-31.9, adult: Secondary | ICD-10-CM | POA: Diagnosis not present

## 2021-10-07 DIAGNOSIS — R921 Mammographic calcification found on diagnostic imaging of breast: Secondary | ICD-10-CM | POA: Diagnosis not present

## 2021-10-07 DIAGNOSIS — R928 Other abnormal and inconclusive findings on diagnostic imaging of breast: Secondary | ICD-10-CM | POA: Diagnosis not present

## 2021-10-08 DIAGNOSIS — Z7901 Long term (current) use of anticoagulants: Secondary | ICD-10-CM | POA: Diagnosis not present

## 2021-10-08 DIAGNOSIS — R220 Localized swelling, mass and lump, head: Secondary | ICD-10-CM | POA: Diagnosis not present

## 2021-10-08 DIAGNOSIS — I4891 Unspecified atrial fibrillation: Secondary | ICD-10-CM | POA: Diagnosis not present

## 2021-10-08 DIAGNOSIS — D210 Benign neoplasm of connective and other soft tissue of head, face and neck: Secondary | ICD-10-CM | POA: Diagnosis not present

## 2021-10-08 DIAGNOSIS — N8300 Follicular cyst of ovary, unspecified side: Secondary | ICD-10-CM | POA: Diagnosis not present

## 2021-10-08 DIAGNOSIS — Z85820 Personal history of malignant melanoma of skin: Secondary | ICD-10-CM | POA: Diagnosis not present

## 2021-10-08 DIAGNOSIS — Z79899 Other long term (current) drug therapy: Secondary | ICD-10-CM | POA: Diagnosis not present

## 2021-10-13 DIAGNOSIS — S91002A Unspecified open wound, left ankle, initial encounter: Secondary | ICD-10-CM | POA: Diagnosis not present

## 2021-10-13 DIAGNOSIS — R895 Abnormal microbiological findings in specimens from other organs, systems and tissues: Secondary | ICD-10-CM | POA: Diagnosis not present

## 2021-12-08 ENCOUNTER — Encounter: Payer: Self-pay | Admitting: Family Medicine

## 2021-12-08 ENCOUNTER — Encounter: Payer: Self-pay | Admitting: Gastroenterology

## 2021-12-13 ENCOUNTER — Telehealth: Payer: Self-pay | Admitting: *Deleted

## 2021-12-13 NOTE — Telephone Encounter (Signed)
noted 

## 2021-12-13 NOTE — Telephone Encounter (Signed)
Patient is on Eliquis, patients OV and colon were canceled and appointment was made with Dr. Lyndel Safe 8/22 1:50.

## 2021-12-13 NOTE — Telephone Encounter (Signed)
Call placed to pt. To see if she is currently taking Eliquis message left with call back number left on mobile phone and home phone,if she is taking eliquis pt. Will need OV scheduled?

## 2022-01-11 ENCOUNTER — Encounter: Payer: Medicare Other | Admitting: Gastroenterology

## 2022-01-19 DIAGNOSIS — I422 Other hypertrophic cardiomyopathy: Secondary | ICD-10-CM | POA: Diagnosis not present

## 2022-01-19 DIAGNOSIS — N1832 Chronic kidney disease, stage 3b: Secondary | ICD-10-CM | POA: Diagnosis not present

## 2022-01-19 DIAGNOSIS — I480 Paroxysmal atrial fibrillation: Secondary | ICD-10-CM | POA: Diagnosis not present

## 2022-01-19 DIAGNOSIS — R635 Abnormal weight gain: Secondary | ICD-10-CM | POA: Diagnosis not present

## 2022-01-21 DIAGNOSIS — L304 Erythema intertrigo: Secondary | ICD-10-CM | POA: Diagnosis not present

## 2022-01-21 DIAGNOSIS — L821 Other seborrheic keratosis: Secondary | ICD-10-CM | POA: Diagnosis not present

## 2022-01-21 DIAGNOSIS — D229 Melanocytic nevi, unspecified: Secondary | ICD-10-CM | POA: Diagnosis not present

## 2022-01-21 DIAGNOSIS — L7211 Pilar cyst: Secondary | ICD-10-CM | POA: Diagnosis not present

## 2022-01-21 DIAGNOSIS — L814 Other melanin hyperpigmentation: Secondary | ICD-10-CM | POA: Diagnosis not present

## 2022-01-21 DIAGNOSIS — Z87898 Personal history of other specified conditions: Secondary | ICD-10-CM | POA: Diagnosis not present

## 2022-01-21 DIAGNOSIS — Z85820 Personal history of malignant melanoma of skin: Secondary | ICD-10-CM | POA: Diagnosis not present

## 2022-01-21 DIAGNOSIS — L720 Epidermal cyst: Secondary | ICD-10-CM | POA: Diagnosis not present

## 2022-01-25 ENCOUNTER — Encounter: Payer: Self-pay | Admitting: Gastroenterology

## 2022-01-25 ENCOUNTER — Telehealth: Payer: Self-pay

## 2022-01-25 ENCOUNTER — Ambulatory Visit (INDEPENDENT_AMBULATORY_CARE_PROVIDER_SITE_OTHER): Payer: Medicare Other | Admitting: Gastroenterology

## 2022-01-25 VITALS — BP 150/80 | HR 66 | Ht 67.0 in | Wt 198.0 lb

## 2022-01-25 DIAGNOSIS — Z86010 Personal history of colonic polyps: Secondary | ICD-10-CM | POA: Diagnosis not present

## 2022-01-25 NOTE — Telephone Encounter (Signed)
Patient with diagnosis of afib on Eliquis for anticoagulation.    Procedure: colonoscopy Date of procedure: 03/17/22  CHA2DS2-VASc Score = 4  This indicates a 4.8% annual risk of stroke. The patient's score is based upon: CHF History: 0 HTN History: 1 Diabetes History: 0 Stroke History: 0 Vascular Disease History: 0 Age Score: 2 Gender Score: 1  CrCl 39m/min Platelet count 215K  Per office protocol, patient can hold Eliquis for 1 day prior to procedure as requested.    **This guidance is not considered finalized until pre-operative APP has relayed final recommendations.**

## 2022-01-25 NOTE — Patient Instructions (Addendum)
_______________________________________________________  If you are age 77 or older, your body mass index should be between 23-30. Your Body mass index is 31.01 kg/m. If this is out of the aforementioned range listed, please consider follow up with your Primary Care Provider.  If you are age 13 or younger, your body mass index should be between 19-25. Your Body mass index is 31.01 kg/m. If this is out of the aformentioned range listed, please consider follow up with your Primary Care Provider.   ________________________________________________________  The [facility] providers would like to encourage you to use [facility] to communicate with providers for non-urgent requests or questions.  Due to long hold times on the telephone, sending your provider a message by [facility] may be a faster and more efficient way to get a response.  Please allow 48 business hours for a response.  Please remember that this is for non-urgent requests.  _______________________________________________________  Monica Anderson have been scheduled for a colonoscopy. Please follow written instructions given to you at your visit today.  Please pick up your prep supplies at the pharmacy within the next 1-3 days. If you use inhalers (even only as needed), please bring them with you on the day of your procedure.  We have given you samples of the following medication to take: Clenpiq  You will be contacted by our office prior to your procedure for directions on holding your Eliquis.  If you do not hear from our office 2 week prior to your scheduled procedure, please call [phone] to discuss.   Thank you,  Dr. Jackquline Denmark

## 2022-01-25 NOTE — Progress Notes (Signed)
Chief Complaint: for colon  Referring Provider:  Serita Grammes, MD      ASSESSMENT AND PLAN;   #1. H/O polyps  #2. A Fib on eliquis  Plan: -Colon after cardiac clearance and holding Eliquis x 24hrs.     Discussed risks & benefits of colonoscopy. Risks including rare perforation req laparotomy, bleeding after bx/polypectomy req blood transfusion, rarely missing neoplasms, risks of anesthesia/sedation, rare risk of damage to internal organs. Benefits outweigh the risks. Patient agrees to proceed. All the questions were answered. Pt consents to proceed.   HPI:    Monica Anderson is a 76 y.o. female  On eliquis for A Fib (Nl EF 60-65% 2021), HOCM (followed by Dr Harriet Masson), HTN, HLD  No nausea, vomiting, heartburn, regurgitation, odynophagia or dysphagia.  No significant diarrhea or constipation.  No melena or hematochezia. No unintentional weight loss. No abdominal pain.  Doing very well from med standpoint.  She would like to have colonoscopy done instead of Cologuard   Colon 11/2016 ([facility]) colon polyp s/p polypectomy. Bx- SSA. Min sig div  Past Medical History:  Diagnosis Date   Anemia    Arthritis    Asymmetrical left sensorineural hearing loss 10/01/2019   Atrial fibrillation ([place]) 10/02/2019   Essential hypertension 10/03/2019   History of appendectomy 10/02/2019   History of atypical nevus 12/14/2016   History of colon polyps    History of malignant melanoma of skin 06/16/2017   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, left ([place]) 01/17/2017   Meniere's disease 08/29/2019   Mixed hyperlipidemia 10/03/2019   Multiple dysplastic nevi    Pleural effusion    Tubal ligation evaluation 10/02/2019    Past Surgical History:  Procedure Laterality Date   [place]   CATARACT EXTRACTION, BILATERAL  2019   COLONOSCOPY  11/07/2016   Colonic polyp status post polypectomy. Minimal sigmoid diverticulosis   DILATION AND  CURETTAGE OF UTERUS     MOHS SURGERY Left 2019   Melanoma on left ear   TUBAL LIGATION  1977    Family History  Problem Relation Age of Onset   Hypertension Mother    Atrial fibrillation Mother    Hypertension Father    Stroke Father    Diabetes Father    Colon cancer Neg Hx    Liver cancer Neg Hx    Stomach cancer Neg Hx    Rectal cancer Neg Hx    Esophageal cancer Neg Hx     Social History   Tobacco Use   Smoking status: Never   Smokeless tobacco: Never  Vaping Use   Vaping Use: Never used  Substance Use Topics   Alcohol use: Yes    Comment: Wine once every 2 months   Drug use: Never    Current Outpatient Medications  Medication Sig Dispense Refill   B Complex-C-Folic Acid (HM SUPER VITAMIN B COMPLEX/C) TABS Take 1 tablet by mouth daily.     buPROPion (WELLBUTRIN XL) 150 MG 24 hr tablet Take 1 tablet by mouth daily.     Calcium Carbonate-Vit D-Min (CALCIUM 1200 PO) Take 1,200 mg by mouth daily.     cholecalciferol (VITAMIN D3) 25 MCG (1000 UNIT) tablet Take 2,000 Units by mouth daily.     diltiazem (CARDIZEM CD) 180 MG 24 hr capsule TAKE 1 CAPSULE BY MOUTH EVERY DAY 90 capsule 3   ELIQUIS 5 MG TABS tablet Take 5 mg by mouth 2 (two) times  daily.  3   famotidine (PEPCID) 20 MG tablet Take 20 mg by mouth at bedtime.     ketoconazole (NIZORAL) 2 % cream ketoconazole 2 % topical cream  APPLY TO RASH IN SKIN FOLDS 2 TIMES DAILY X 14 DAYS AS NEEDED     montelukast (SINGULAIR) 10 MG tablet Take 10 mg by mouth 3 times/day as needed-between meals & bedtime.     Multiple Vitamins-Minerals (HEALTHY EYES) TABS Take 1 tablet by mouth daily.     Omega-3 Fatty Acids (FISH OIL) 1000 MG CAPS Take 1,000 mg by mouth daily.      rosuvastatin (CRESTOR) 5 MG tablet Take 5 mg by mouth every evening.  12   spironolactone-hydrochlorothiazide (ALDACTAZIDE) 25-25 MG tablet Take 0.5 tablets by mouth daily.      estradiol (ESTRACE) 0.1 MG/GM vaginal cream APPLY TO THE LABIA OR VAGINALLY 3 TIMES  A WEEK AT BEDTIME FOR MENOPAUSAL CHANGES.     Current Facility-Administered Medications  Medication Dose Route Frequency Provider Last Rate Last Admin   triamcinolone acetonide (KENALOG-40) injection 20 mg  20 mg Other Once Landis Martins, DPM        No Known Allergies  Review of Systems:  Constitutional: Denies fever, chills, diaphoresis, appetite change and fatigue.  HEENT: Denies photophobia, eye pain, redness, hearing loss, ear pain, congestion, sore throat, rhinorrhea, sneezing, mouth sores, neck pain, neck stiffness and tinnitus.   Respiratory: Denies SOB, DOE, cough, chest tightness,  and wheezing.   Cardiovascular: Denies chest pain, palpitations and leg swelling.  Genitourinary: Denies dysuria, urgency, frequency, hematuria, flank pain and difficulty urinating.  Musculoskeletal: Denies myalgias, back pain, joint swelling, arthralgias and gait problem.  Skin: No rash.  Neurological: Denies dizziness, seizures, syncope, weakness, light-headedness, numbness and headaches.  Hematological: Denies adenopathy. Easy bruising, personal or family bleeding history  Psychiatric/Behavioral: No anxiety or depression     Physical Exam:    BP (!) 150/80   Pulse 66   Ht '5\' 7"'$  (1.702 m)   Wt 198 lb (89.8 kg)   SpO2 96%   BMI 31.01 kg/m  Wt Readings from Last 3 Encounters:  01/25/22 198 lb (89.8 kg)  11/17/20 170 lb 12.8 oz (77.5 kg)  10/05/20 173 lb (78.5 kg)   Constitutional:  Well-developed, in no acute distress. Psychiatric: Normal mood and affect. Behavior is normal. HEENT: Pupils normal.  Conjunctivae are normal. No scleral icterus. Cardiovascular: Normal rate, No edema Pulmonary/chest: Effort normal and breath sounds normal. No wheezing, rales or rhonchi. Abdominal: Soft, nondistended. Nontender. Bowel sounds active throughout. There are no masses palpable. No hepatomegaly. Rectal: Deferred Neurological: Alert and oriented to person place and time. Skin: Skin is warm and  dry. No rashes noted.       Carmell Austria, MD 01/25/2022, 2:07 PM  Cc: Serita Grammes, MD

## 2022-01-25 NOTE — Telephone Encounter (Signed)
[facility] Pre-operative Risk Assessment     Request for surgical clearance:     Endoscopy Procedure  What type of surgery is being performed?     Colon  When is this surgery scheduled?     03-17-2022  What type of clearance is required ?   Pharmacy  Are there any medications that need to be held prior to surgery and how long? Eliquis 1 day hold  Practice name and name of physician performing surgery?      [facility] Jackquline Denmark  What is your office phone and fax number?      Phone- [phone]  [phone]  Anesthesia type (None, local, MAC, general) ?       MAC

## 2022-01-26 ENCOUNTER — Telehealth: Payer: Self-pay | Admitting: *Deleted

## 2022-01-26 NOTE — Telephone Encounter (Signed)
Pt agreeable to tele pre op appt 02/28/22 @ 101 am. Med rec and consent are done.

## 2022-01-26 NOTE — Telephone Encounter (Signed)
Pharmacy has addressed anticoagulation.  Preoperative team, please contact this patient and set up a phone call appointment for further cardiac evaluation.  Thank you for your help.  Jossie Ng. Tru Rana NP-C    01/26/2022, 9:16 AM [place] [address] Office [phone] Fax [phone]

## 2022-01-26 NOTE — Telephone Encounter (Signed)
Pt agreeable to tele pre op appt 02/28/22 @ 101 am. Med rec and consent are done.     Patient Consent for Virtual Visit        Monica Anderson has provided verbal consent on 01/26/2022 for a virtual visit (video or telephone).   CONSENT FOR VIRTUAL VISIT FOR:  Monica Anderson  By participating in this virtual visit I agree to the following:  I hereby voluntarily request, consent and authorize [facility] and its employed or contracted physicians, physician assistants, nurse practitioners or other licensed health care professionals (the Practitioner), to provide me with telemedicine health care services (the "Services") as deemed necessary by the treating Practitioner. I acknowledge and consent to receive the Services by the Practitioner via telemedicine. I understand that the telemedicine visit will involve communicating with the Practitioner through live audiovisual communication technology and the disclosure of certain medical information by electronic transmission. I acknowledge that I have been given the opportunity to request an in-person assessment or other available alternative prior to the telemedicine visit and am voluntarily participating in the telemedicine visit.  I understand that I have the right to withhold or withdraw my consent to the use of telemedicine in the course of my care at any time, without affecting my right to future care or treatment, and that the Practitioner or I may terminate the telemedicine visit at any time. I understand that I have the right to inspect all information obtained and/or recorded in the course of the telemedicine visit and may receive copies of available information for a reasonable fee.  I understand that some of the potential risks of receiving the Services via telemedicine include:  Delay or interruption in medical evaluation due to technological equipment failure or disruption; Information transmitted may not be sufficient (e.g. poor resolution  of images) to allow for appropriate medical decision making by the Practitioner; and/or  In rare instances, security protocols could fail, causing a breach of personal health information.  Furthermore, I acknowledge that it is my responsibility to provide information about my medical history, conditions and care that is complete and accurate to the best of my ability. I acknowledge that Practitioner's advice, recommendations, and/or decision may be based on factors not within their control, such as incomplete or inaccurate data provided by me or distortions of diagnostic images or specimens that may result from electronic transmissions. I understand that the practice of medicine is not an exact science and that Practitioner makes no warranties or guarantees regarding treatment outcomes. I acknowledge that a copy of this consent can be made available to me via my patient portal ([facility]), or I can request a printed copy by calling the office of [facility].    I understand that my insurance will be billed for this visit.   I have read or had this consent read to me. I understand the contents of this consent, which adequately explains the benefits and risks of the Services being provided via telemedicine.  I have been provided ample opportunity to ask questions regarding this consent and the Services and have had my questions answered to my satisfaction. I give my informed consent for the services to be provided through the use of telemedicine in my medical care

## 2022-02-11 ENCOUNTER — Telehealth: Payer: Self-pay | Admitting: Gastroenterology

## 2022-02-11 NOTE — Telephone Encounter (Signed)
I have called and left her a detailed message that it should be okay to get the knee injection but she cannot drive herself to the appointment because of the sedation she will be given for her procedure.

## 2022-02-11 NOTE — Telephone Encounter (Signed)
Inbound call from patient stating that she has a colonoscopy scheduled for  10/12 at 10:30 with Dr. Lyndel Safe. Patient also stated that at 3:30 that afternoon she is going to have a knee injection. Patient is seeking advice if that is okay. Please advise.

## 2022-02-28 ENCOUNTER — Ambulatory Visit: Payer: Medicare Other | Attending: Nurse Practitioner | Admitting: Nurse Practitioner

## 2022-02-28 DIAGNOSIS — Z01810 Encounter for preprocedural cardiovascular examination: Secondary | ICD-10-CM | POA: Insufficient documentation

## 2022-02-28 NOTE — Progress Notes (Signed)
Virtual Visit via Telephone Note   Because of Monica Anderson's co-morbid illnesses, she is at least at moderate risk for complications without adequate follow up.  This format is felt to be most appropriate for this patient at this time.  The patient did not have access to video technology/had technical difficulties with video requiring transitioning to audio format only (telephone).  All issues noted in this document were discussed and addressed.  No physical exam could be performed with this format.  Please refer to the patient's chart for her consent to telehealth for [facility].  Evaluation Performed:  Preoperative cardiovascular risk assessment _____________   Date:  02/28/2022   Patient ID:  Monica Anderson, DOB 1945-06-30, MRN [id number] Patient Location:  Home Provider location:   Office  Primary Care Provider:  Serita Grammes, MD Primary Cardiologist:  Berniece Salines, DO  Chief Complaint / Patient Profile   76 y.o. y/o female with a h/o hypertrophic cardiomyopathy, NSVT, paroxysmal atrial tachycardia, paroxysmal atrial fibrillation, hypertension, and hyperlipidemia who is pending colonoscopy on 03/17/2022 with Dr. Jackquline Denmark of [facility] and presents today for telephonic preoperative cardiovascular risk assessment.  Past Medical History    Past Medical History:  Diagnosis Date   Anemia    Arthritis    Asymmetrical left sensorineural hearing loss 10/01/2019   Atrial fibrillation ([place]) 10/02/2019   Essential hypertension 10/03/2019   History of appendectomy 10/02/2019   History of atypical nevus 12/14/2016   History of colon polyps    History of malignant melanoma of skin 06/16/2017   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, left ([place]) 01/17/2017   Meniere's disease 08/29/2019   Mixed hyperlipidemia 10/03/2019   Multiple dysplastic nevi    Pleural effusion    Tubal ligation evaluation 10/02/2019   Past Surgical History:   Procedure Laterality Date   [place]   CATARACT EXTRACTION, BILATERAL  2019   COLONOSCOPY  11/07/2016   Colonic polyp status post polypectomy. Minimal sigmoid diverticulosis   DILATION AND CURETTAGE OF UTERUS     MOHS SURGERY Left 2019   Melanoma on left ear   TUBAL LIGATION  1977    Allergies  No Known Allergies  History of Present Illness    Monica Anderson is a 76 y.o. female who presents via audio/video conferencing for a telehealth visit today.  Pt was last seen in cardiology clinic virtually on 09/02/2021 by Coletta Memos, NP.  At that time Kelbi Renstrom was doing well.  The patient is now pending procedure as outlined above. Since her last visit, she has done well from a cardiac standpoint. She denies chest pain, palpitations, dyspnea, pnd, orthopnea, n, v, dizziness, syncope, edema, weight gain, or early satiety. All other systems reviewed and are otherwise negative except as noted above.   Home Medications    Prior to Admission medications   Medication Sig Start Date End Date Taking? Authorizing Provider  B Complex-C-Folic Acid (HM SUPER VITAMIN B COMPLEX/C) TABS Take 1 tablet by mouth daily.    [provider]  buPROPion (WELLBUTRIN XL) 150 MG 24 hr tablet Take 1 tablet by mouth daily.    [provider]  Calcium Carbonate-Vit D-Min (CALCIUM 1200 PO) Take 1,200 mg by mouth daily.    [provider]  cholecalciferol (VITAMIN D3) 25 MCG (1000 UNIT) tablet Take 2,000 Units by mouth daily.    [provider]  diltiazem (CARDIZEM CD) 180 MG 24 hr  capsule TAKE 1 CAPSULE BY MOUTH EVERY DAY 06/09/21   Tobb, Kardie, DO  ELIQUIS 5 MG TABS tablet Take 5 mg by mouth 2 (two) times daily. 01/16/18   [provider]  estradiol (ESTRACE) 0.1 MG/GM vaginal cream APPLY TO THE LABIA OR VAGINALLY 3 TIMES A WEEK AT BEDTIME FOR MENOPAUSAL CHANGES.    [provider]  famotidine (PEPCID) 20 MG tablet Take 20 mg by  mouth at bedtime. 08/09/21   [provider]  ketoconazole (NIZORAL) 2 % cream ketoconazole 2 % topical cream  APPLY TO RASH IN SKIN FOLDS 2 TIMES DAILY X 14 DAYS AS NEEDED    [provider]  montelukast (SINGULAIR) 10 MG tablet Take 10 mg by mouth 3 times/day as needed-between meals & bedtime. 08/09/21   [provider]  Multiple Vitamins-Minerals (HEALTHY EYES) TABS Take 1 tablet by mouth daily.    [provider]  Omega-3 Fatty Acids (FISH OIL) 1000 MG CAPS Take 1,000 mg by mouth daily.     [provider]  rosuvastatin (CRESTOR) 5 MG tablet Take 5 mg by mouth every evening. 12/10/17   [provider]  spironolactone-hydrochlorothiazide (ALDACTAZIDE) 25-25 MG tablet Take 0.5 tablets by mouth daily.     [provider]    Physical Exam    Vital Signs:  Fabian Coca does not have vital signs available for review today.  Given telephonic nature of communication, physical exam is limited. AAOx3. NAD. Normal affect.  Speech and respirations are unlabored.  Accessory Clinical Findings    None  Assessment & Plan    1.  Preoperative Cardiovascular Risk Assessment:  According to the Revised Cardiac Risk Index (RCRI), her Perioperative Risk of Major Cardiac Event is (%): 0.4. Her Functional Capacity in METs is: 6.27 according to the Duke Activity Status Index (DASI).Therefore, based on ACC/AHA guidelines, patient would be at acceptable risk for the planned procedure without further cardiovascular testing.   The patient was advised that if she develops new symptoms prior to surgery to contact our office to arrange for a follow-up visit, and she verbalized understanding.  Per office protocol, patient can hold Eliquis for 1 day prior to procedure as requested. Please resume Eliquis as soon as possible postprocedure, a the discretion of the surgeon.   A copy of this note will be routed to requesting surgeon.  Time:   Today, I have  spent 6 minutes with the patient with telehealth technology discussing medical history, symptoms, and management plan.     Lenna Sciara, NP  02/28/2022, 10:00 AM

## 2022-03-01 NOTE — Telephone Encounter (Signed)
Couldn't LVM. Contacted patient to let her know to hold Eliquis 1 day prior to her procedure.

## 2022-03-02 DIAGNOSIS — F4321 Adjustment disorder with depressed mood: Secondary | ICD-10-CM | POA: Diagnosis not present

## 2022-03-02 DIAGNOSIS — N1832 Chronic kidney disease, stage 3b: Secondary | ICD-10-CM | POA: Diagnosis not present

## 2022-03-02 DIAGNOSIS — I480 Paroxysmal atrial fibrillation: Secondary | ICD-10-CM | POA: Diagnosis not present

## 2022-03-02 DIAGNOSIS — I422 Other hypertrophic cardiomyopathy: Secondary | ICD-10-CM | POA: Diagnosis not present

## 2022-03-02 NOTE — Telephone Encounter (Signed)
Informed the patient to hold Eliquis 1 day prior procedure. Patient voiced understanding.

## 2022-03-10 DIAGNOSIS — M170 Bilateral primary osteoarthritis of knee: Secondary | ICD-10-CM | POA: Diagnosis not present

## 2022-03-17 ENCOUNTER — Ambulatory Visit (AMBULATORY_SURGERY_CENTER): Payer: Medicare Other | Admitting: Gastroenterology

## 2022-03-17 ENCOUNTER — Encounter: Payer: Self-pay | Admitting: Gastroenterology

## 2022-03-17 VITALS — BP 129/61 | HR 55 | Temp 96.8°F | Resp 15 | Ht 67.0 in | Wt 198.0 lb

## 2022-03-17 DIAGNOSIS — Z86010 Personal history of colonic polyps: Secondary | ICD-10-CM

## 2022-03-17 DIAGNOSIS — D123 Benign neoplasm of transverse colon: Secondary | ICD-10-CM

## 2022-03-17 DIAGNOSIS — Z09 Encounter for follow-up examination after completed treatment for conditions other than malignant neoplasm: Secondary | ICD-10-CM

## 2022-03-17 DIAGNOSIS — I4891 Unspecified atrial fibrillation: Secondary | ICD-10-CM | POA: Diagnosis not present

## 2022-03-17 DIAGNOSIS — I10 Essential (primary) hypertension: Secondary | ICD-10-CM | POA: Diagnosis not present

## 2022-03-17 MED ORDER — SODIUM CHLORIDE 0.9 % IV SOLN: 500.0000 mL | Freq: Once | INTRAVENOUS | Status: DC

## 2022-03-17 NOTE — Patient Instructions (Signed)
Please read handouts provided. Continue present medications. Resume Eliquis ( apixaban ) at prior dose tomorrow. No repeat colonoscopy dure to age.   YOU HAD AN ENDOSCOPIC PROCEDURE TODAY AT [facility]:   Refer to the procedure report that was given to you for any specific questions about what was found during the examination.  If the procedure report does not answer your questions, please call your gastroenterologist to clarify.  If you requested that your care partner not be given the details of your procedure findings, then the procedure report has been included in a sealed envelope for you to review at your convenience later.  YOU SHOULD EXPECT: Some feelings of bloating in the abdomen. Passage of more gas than usual.  Walking can help get rid of the air that was put into your GI tract during the procedure and reduce the bloating. If you had a lower endoscopy (such as a colonoscopy or flexible sigmoidoscopy) you may notice spotting of blood in your stool or on the toilet paper. If you underwent a bowel prep for your procedure, you may not have a normal bowel movement for a few days.  Please Note:  You might notice some irritation and congestion in your nose or some drainage.  This is from the oxygen used during your procedure.  There is no need for concern and it should clear up in a day or so.  SYMPTOMS TO REPORT IMMEDIATELY:  Following lower endoscopy (colonoscopy or flexible sigmoidoscopy):  Excessive amounts of blood in the stool  Significant tenderness or worsening of abdominal pains  Swelling of the abdomen that is new, acute  Fever of 100F or higher.  For urgent or emergent issues, a gastroenterologist can be reached at any hour by calling [phone]. Do not use MyChart messaging for urgent concerns.    DIET:  We do recommend a small meal at first, but then you may proceed to your regular diet.  Drink plenty of fluids but you should avoid alcoholic beverages  for 24 hours.  ACTIVITY:  You should plan to take it easy for the rest of today and you should NOT DRIVE or use heavy machinery until tomorrow (because of the sedation medicines used during the test).    FOLLOW UP: Our staff will call the number listed on your records the next business day following your procedure.  We will call around 7:15- 8:00 am to check on you and address any questions or concerns that you may have regarding the information given to you following your procedure. If we do not reach you, we will leave a message.     If any biopsies were taken you will be contacted by phone or by letter within the next 1-3 weeks.  Please call us at [phone] if you have not heard about the biopsies in 3 weeks.    SIGNATURES/CONFIDENTIALITY: You and/or your care partner have signed paperwork which will be entered into your electronic medical record.  These signatures attest to the fact that that the information above on your After Visit Summary has been reviewed and is understood.  Full responsibility of the confidentiality of this discharge information lies with you and/or your care-partner.

## 2022-03-17 NOTE — Progress Notes (Signed)
Called to room to assist during endoscopic procedure.  Patient ID and intended procedure confirmed with present staff. Received instructions for my participation in the procedure from the performing physician.  

## 2022-03-17 NOTE — Op Note (Signed)
[place] Patient Name: Monica Anderson Procedure Date: 03/17/2022 10:26 AM MRN: [id number] Endoscopist: Jackquline Denmark , MD Age: 76 Referring MD:  Date of Birth: Jun 30, 1945 Gender: Female Account #: [id number] Procedure:                Colonoscopy Indications:              High risk colon cancer surveillance: Personal                            history of colonic polyps Medicines:                Monitored Anesthesia Care Procedure:                Pre-Anesthesia Assessment:                           - Prior to the procedure, a History and Physical                            was performed, and patient medications and                            allergies were reviewed. The patient's tolerance of                            previous anesthesia was also reviewed. The risks                            and benefits of the procedure and the sedation                            options and risks were discussed with the patient.                            All questions were answered, and informed consent                            was obtained. Prior Anticoagulants: Eliquis was                            held 1 day prior. ASA Grade Assessment: II - A                            patient with mild systemic disease. After reviewing                            the risks and benefits, the patient was deemed in                            satisfactory condition to undergo the procedure.                           After obtaining informed consent, the colonoscope  was passed under direct vision. Throughout the                            procedure, the patient's blood pressure, pulse, and                            oxygen saturations were monitored continuously. The                            Olympus PCF-H190DL ([id number]) Colonoscope was                            introduced through the anus and advanced to the the                            cecum, identified by  appendiceal orifice and                            ileocecal valve. The colonoscopy was performed                            without difficulty. The patient tolerated the                            procedure well. The quality of the bowel                            preparation was good except in some areas where                            there was some solid stool. Aggressive suctioning                            and aspiration was performed. Overall the                            examination was adequate. The ileocecal valve,                            appendiceal orifice, and rectum were photographed. Scope In: 10:28:13 AM Scope Out: 10:43:45 AM Scope Withdrawal Time: 0 hours 10 minutes 32 seconds  Total Procedure Duration: 0 hours 15 minutes 32 seconds  Findings:                 A 2 mm polyp was found in the mid transverse colon.                            The polyp was sessile. The polyp was removed with a                            cold snare. Resection was complete. However, it was                            not  retrieved d/t very small size and being lost in                            the stool.                           A few small-mouthed diverticula were found in the                            sigmoid colon.                           Non-bleeding internal hemorrhoids were found during                            retroflexion. The hemorrhoids were small and Grade                            I (internal hemorrhoids that do not prolapse).                           The exam was otherwise without abnormality on                            direct and retroflexion views. Complications:            No immediate complications. Estimated Blood Loss:     Estimated blood loss: none. Impression:               - One 2 mm polyp in the mid transverse colon,                            removed with a cold snare. Resected and retrieved.                           - Minimal sigmoid diverticulosis.                            - Non-bleeding internal hemorrhoids.                           - The examination was otherwise normal on direct                            and retroflexion views. Recommendation:           - Patient has a contact number available for                            emergencies. The signs and symptoms of potential                            delayed complications were discussed with the                            patient. Return to normal activities tomorrow.  Written discharge instructions were provided to the                            patient.                           - Resume previous diet.                           - Continue present medications.                           - Resume Eliquis (apixaban) at prior dose tomorrow.                           - No repeat colonoscopy due to age.                           - The findings and recommendations were discussed                            with the patient's family. Jackquline Denmark, MD 03/17/2022 10:48:38 AM This report has been signed electronically.

## 2022-03-17 NOTE — Progress Notes (Signed)
Chief Complaint: for colon  Referring Provider:  Serita Grammes, MD      ASSESSMENT AND PLAN;   #1. H/O polyps  #2. A Fib on eliquis  Plan: -Colon after cardiac clearance and holding Eliquis x 24hrs.     Discussed risks & benefits of colonoscopy. Risks including rare perforation req laparotomy, bleeding after bx/polypectomy req blood transfusion, rarely missing neoplasms, risks of anesthesia/sedation, rare risk of damage to internal organs. Benefits outweigh the risks. Patient agrees to proceed. All the questions were answered. Pt consents to proceed.   HPI:    Monica Anderson is a 76 y.o. female  On eliquis for A Fib (Nl EF 60-65% 2021), HOCM (followed by Dr Harriet Masson), HTN, HLD  No nausea, vomiting, heartburn, regurgitation, odynophagia or dysphagia.  No significant diarrhea or constipation.  No melena or hematochezia. No unintentional weight loss. No abdominal pain.  Doing very well from med standpoint.  She would like to have colonoscopy done instead of Cologuard   Colon 11/2016 ([facility]) colon polyp s/p polypectomy. Bx- SSA. Min sig div  Past Medical History:  Diagnosis Date   Anemia    Arthritis    Asymmetrical left sensorineural hearing loss 10/01/2019   Atrial fibrillation ([place]) 10/02/2019   Essential hypertension 10/03/2019   History of appendectomy 10/02/2019   History of atypical nevus 12/14/2016   History of colon polyps    History of malignant melanoma of skin 06/16/2017   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, left ([place]) 01/17/2017   Meniere's disease 08/29/2019   Mixed hyperlipidemia 10/03/2019   Multiple dysplastic nevi    Pleural effusion    Tubal ligation evaluation 10/02/2019    Past Surgical History:  Procedure Laterality Date   [place]   CATARACT EXTRACTION, BILATERAL  2019   COLONOSCOPY  11/07/2016   Colonic polyp status post polypectomy. Minimal sigmoid diverticulosis   DILATION AND  CURETTAGE OF UTERUS     MOHS SURGERY Left 2019   Melanoma on left ear   TUBAL LIGATION  1977    Family History  Problem Relation Age of Onset   Hypertension Mother    Atrial fibrillation Mother    Hypertension Father    Stroke Father    Diabetes Father    Colon cancer Neg Hx    Liver cancer Neg Hx    Stomach cancer Neg Hx    Rectal cancer Neg Hx    Esophageal cancer Neg Hx     Social History   Tobacco Use   Smoking status: Never   Smokeless tobacco: Never  Vaping Use   Vaping Use: Never used  Substance Use Topics   Alcohol use: Yes    Comment: Wine once every 2 months   Drug use: Never    Current Outpatient Medications  Medication Sig Dispense Refill   B Complex-C-Folic Acid (HM SUPER VITAMIN B COMPLEX/C) TABS Take 1 tablet by mouth daily.     buPROPion (WELLBUTRIN XL) 150 MG 24 hr tablet Take 1 tablet by mouth daily.     Calcium Carbonate-Vit D-Min (CALCIUM 1200 PO) Take 1,200 mg by mouth daily.     cholecalciferol (VITAMIN D3) 25 MCG (1000 UNIT) tablet Take 2,000 Units by mouth daily.     diltiazem (CARDIZEM CD) 180 MG 24 hr capsule TAKE 1 CAPSULE BY MOUTH EVERY DAY 90 capsule 3   estradiol (ESTRACE) 0.1 MG/GM vaginal cream APPLY TO THE LABIA OR VAGINALLY 3  TIMES A WEEK AT BEDTIME FOR MENOPAUSAL CHANGES.     famotidine (PEPCID) 20 MG tablet Take 20 mg by mouth at bedtime.     FARXIGA 5 MG TABS tablet Take 5 mg by mouth daily.     ketoconazole (NIZORAL) 2 % cream ketoconazole 2 % topical cream  APPLY TO RASH IN SKIN FOLDS 2 TIMES DAILY X 14 DAYS AS NEEDED     montelukast (SINGULAIR) 10 MG tablet Take 10 mg by mouth 3 times/day as needed-between meals & bedtime.     Omega-3 Fatty Acids (FISH OIL) 1000 MG CAPS Take 1,000 mg by mouth daily.      rosuvastatin (CRESTOR) 5 MG tablet Take 5 mg by mouth every evening.  12   spironolactone-hydrochlorothiazide (ALDACTAZIDE) 25-25 MG tablet Take 0.5 tablets by mouth daily.      ELIQUIS 5 MG TABS tablet Take 5 mg by mouth 2  (two) times daily.  3   Multiple Vitamins-Minerals (HEALTHY EYES) TABS Take 1 tablet by mouth daily. (Patient not taking: Reported on 03/17/2022)     Current Facility-Administered Medications  Medication Dose Route Frequency Provider Last Rate Last Admin   0.9 %  sodium chloride infusion  500 mL Intravenous Once Jackquline Denmark, MD       triamcinolone acetonide (KENALOG-40) injection 20 mg  20 mg Other Once Landis Martins, DPM        No Known Allergies  Review of Systems:  Constitutional: Denies fever, chills, diaphoresis, appetite change and fatigue.  HEENT: Denies photophobia, eye pain, redness, hearing loss, ear pain, congestion, sore throat, rhinorrhea, sneezing, mouth sores, neck pain, neck stiffness and tinnitus.   Respiratory: Denies SOB, DOE, cough, chest tightness,  and wheezing.   Cardiovascular: Denies chest pain, palpitations and leg swelling.  Genitourinary: Denies dysuria, urgency, frequency, hematuria, flank pain and difficulty urinating.  Musculoskeletal: Denies myalgias, back pain, joint swelling, arthralgias and gait problem.  Skin: No rash.  Neurological: Denies dizziness, seizures, syncope, weakness, light-headedness, numbness and headaches.  Hematological: Denies adenopathy. Easy bruising, personal or family bleeding history  Psychiatric/Behavioral: No anxiety or depression     Physical Exam:    BP (!) 146/71   Pulse 63   Temp (!) 96.8 F (36 C)   Ht '5\' 7"'$  (1.702 m)   Wt 198 lb (89.8 kg)   SpO2 95%   BMI 31.01 kg/m  Wt Readings from Last 3 Encounters:  03/17/22 198 lb (89.8 kg)  01/25/22 198 lb (89.8 kg)  11/17/20 170 lb 12.8 oz (77.5 kg)   Constitutional:  Well-developed, in no acute distress. Psychiatric: Normal mood and affect. Behavior is normal. HEENT: Pupils normal.  Conjunctivae are normal. No scleral icterus. Cardiovascular: Normal rate, No edema Pulmonary/chest: Effort normal and breath sounds normal. No wheezing, rales or rhonchi. Abdominal:  Soft, nondistended. Nontender. Bowel sounds active throughout. There are no masses palpable. No hepatomegaly. Rectal: Deferred Neurological: Alert and oriented to person place and time. Skin: Skin is warm and dry. No rashes noted.       Carmell Austria, MD 03/17/2022, 10:12 AM  Cc: Serita Grammes, MD

## 2022-03-17 NOTE — Progress Notes (Signed)
PT taken to PACU. Monitors in place. VSS. Report given to RN. 

## 2022-03-18 ENCOUNTER — Encounter (HOSPITAL_COMMUNITY): Payer: Self-pay

## 2022-03-18 ENCOUNTER — Telehealth: Payer: Self-pay | Admitting: *Deleted

## 2022-03-18 ENCOUNTER — Inpatient Hospital Stay: Admit: 2022-03-18 | Payer: Medicare Other | Admitting: Internal Medicine

## 2022-03-18 DIAGNOSIS — M1990 Unspecified osteoarthritis, unspecified site: Secondary | ICD-10-CM | POA: Diagnosis not present

## 2022-03-18 DIAGNOSIS — I10 Essential (primary) hypertension: Secondary | ICD-10-CM | POA: Diagnosis not present

## 2022-03-18 DIAGNOSIS — I249 Acute ischemic heart disease, unspecified: Secondary | ICD-10-CM | POA: Diagnosis not present

## 2022-03-18 DIAGNOSIS — R9431 Abnormal electrocardiogram [ECG] [EKG]: Secondary | ICD-10-CM | POA: Diagnosis not present

## 2022-03-18 DIAGNOSIS — I4891 Unspecified atrial fibrillation: Secondary | ICD-10-CM | POA: Diagnosis not present

## 2022-03-18 DIAGNOSIS — R002 Palpitations: Secondary | ICD-10-CM | POA: Diagnosis not present

## 2022-03-18 DIAGNOSIS — M170 Bilateral primary osteoarthritis of knee: Secondary | ICD-10-CM | POA: Diagnosis not present

## 2022-03-18 NOTE — Telephone Encounter (Signed)
  Follow up Call-     03/17/2022    9:49 AM  Call back number  Post procedure Call Back phone  # [phone]  Permission to leave phone message Yes    Follow up phone call. No answer at number given.  Left message on voicemail.

## 2022-03-20 DIAGNOSIS — R7989 Other specified abnormal findings of blood chemistry: Secondary | ICD-10-CM | POA: Diagnosis not present

## 2022-03-20 DIAGNOSIS — Z9889 Other specified postprocedural states: Secondary | ICD-10-CM | POA: Diagnosis not present

## 2022-03-20 DIAGNOSIS — I083 Combined rheumatic disorders of mitral, aortic and tricuspid valves: Secondary | ICD-10-CM | POA: Diagnosis not present

## 2022-03-20 DIAGNOSIS — I422 Other hypertrophic cardiomyopathy: Secondary | ICD-10-CM | POA: Diagnosis not present

## 2022-03-20 DIAGNOSIS — Z7901 Long term (current) use of anticoagulants: Secondary | ICD-10-CM | POA: Diagnosis not present

## 2022-03-20 DIAGNOSIS — M1990 Unspecified osteoarthritis, unspecified site: Secondary | ICD-10-CM | POA: Diagnosis not present

## 2022-03-20 DIAGNOSIS — N1831 Chronic kidney disease, stage 3a: Secondary | ICD-10-CM | POA: Diagnosis not present

## 2022-03-20 DIAGNOSIS — I517 Cardiomegaly: Secondary | ICD-10-CM | POA: Diagnosis not present

## 2022-03-20 DIAGNOSIS — F325 Major depressive disorder, single episode, in full remission: Secondary | ICD-10-CM | POA: Diagnosis not present

## 2022-03-20 DIAGNOSIS — I4719 Other supraventricular tachycardia: Secondary | ICD-10-CM | POA: Diagnosis not present

## 2022-03-20 DIAGNOSIS — E782 Mixed hyperlipidemia: Secondary | ICD-10-CM | POA: Diagnosis not present

## 2022-03-20 DIAGNOSIS — Z79899 Other long term (current) drug therapy: Secondary | ICD-10-CM | POA: Diagnosis not present

## 2022-03-20 DIAGNOSIS — N179 Acute kidney failure, unspecified: Secondary | ICD-10-CM | POA: Diagnosis not present

## 2022-03-20 DIAGNOSIS — I129 Hypertensive chronic kidney disease with stage 1 through stage 4 chronic kidney disease, or unspecified chronic kidney disease: Secondary | ICD-10-CM | POA: Diagnosis not present

## 2022-03-20 DIAGNOSIS — I480 Paroxysmal atrial fibrillation: Secondary | ICD-10-CM | POA: Diagnosis not present

## 2022-03-20 DIAGNOSIS — I2489 Other forms of acute ischemic heart disease: Secondary | ICD-10-CM | POA: Diagnosis not present

## 2022-03-20 DIAGNOSIS — I4891 Unspecified atrial fibrillation: Secondary | ICD-10-CM | POA: Diagnosis not present

## 2022-03-20 DIAGNOSIS — I499 Cardiac arrhythmia, unspecified: Secondary | ICD-10-CM | POA: Diagnosis not present

## 2022-03-20 DIAGNOSIS — I249 Acute ischemic heart disease, unspecified: Secondary | ICD-10-CM | POA: Diagnosis not present

## 2022-03-20 DIAGNOSIS — I10 Essential (primary) hypertension: Secondary | ICD-10-CM | POA: Diagnosis not present

## 2022-03-22 ENCOUNTER — Encounter: Payer: Self-pay | Admitting: Cardiology

## 2022-03-22 ENCOUNTER — Ambulatory Visit: Payer: Medicare Other | Admitting: Cardiology

## 2022-03-22 NOTE — Telephone Encounter (Signed)
Patient has been rescheduled! I talked to her this morning.

## 2022-03-24 ENCOUNTER — Ambulatory Visit: Payer: Medicare Other | Attending: Cardiology | Admitting: Cardiology

## 2022-03-24 ENCOUNTER — Encounter: Payer: Self-pay | Admitting: Cardiology

## 2022-03-24 VITALS — BP 138/78 | HR 60 | Ht 67.0 in | Wt 195.0 lb

## 2022-03-24 DIAGNOSIS — I422 Other hypertrophic cardiomyopathy: Secondary | ICD-10-CM | POA: Diagnosis not present

## 2022-03-24 DIAGNOSIS — M170 Bilateral primary osteoarthritis of knee: Secondary | ICD-10-CM | POA: Diagnosis not present

## 2022-03-24 DIAGNOSIS — I4719 Other supraventricular tachycardia: Secondary | ICD-10-CM

## 2022-03-24 DIAGNOSIS — I4729 Other ventricular tachycardia: Secondary | ICD-10-CM | POA: Insufficient documentation

## 2022-03-24 DIAGNOSIS — I10 Essential (primary) hypertension: Secondary | ICD-10-CM

## 2022-03-24 DIAGNOSIS — I480 Paroxysmal atrial fibrillation: Secondary | ICD-10-CM | POA: Insufficient documentation

## 2022-03-24 MED ORDER — METOPROLOL TARTRATE 25 MG PO TABS: 25.0000 mg | ORAL_TABLET | Freq: Three times a day (TID) | ORAL | 5 refills | Status: DC | PRN

## 2022-03-24 NOTE — Patient Instructions (Addendum)
Medication Instructions:  Your physician has recommended you make the following change in your medication:  START: Lopressor '25mg'$  as needed if your heart rate is greater than 130 every 8 hours.  *If you need a refill on your cardiac medications before your next appointment, please call your pharmacy*   Lab Work: NONE If you have labs (blood work) drawn today and your tests are completely normal, you will receive your results only by: [place] (if you have MyChart) OR A paper copy in the mail If you have any lab test that is abnormal or we need to change your treatment, we will call you to review the results.   Testing/Procedures: NONE   Follow-Up: At [facility], you and your health needs are our priority.  As part of our continuing mission to provide you with exceptional heart care, we have created designated Provider Care Teams.  These Care Teams include your primary Cardiologist (physician) and Advanced Practice Providers (APPs -  Physician Assistants and Nurse Practitioners) who all work together to provide you with the care you need, when you need it.  We recommend signing up for the patient portal called "MyChart".  Sign up information is provided on this After Visit Summary.  MyChart is used to connect with patients for Virtual Visits (Telemedicine).  Patients are able to view lab/test results, encounter notes, upcoming appointments, etc.  Non-urgent messages can be sent to your provider as well.   To learn more about what you can do with MyChart, go to [email or web address].    Your next appointment:   4 month(s)  The format for your next appointment:   In Person  Provider:   Berniece Salines, DO

## 2022-03-24 NOTE — Progress Notes (Signed)
Pounding sensation.  It lasted in the heart with was elevated she therefore decided to go to the emergency department at [facility]. Cardiology Office Note:    Date:  03/24/2022   ID:  Monica Anderson, DOB 1945/11/26, MRN [id number]  PCP:  Serita Grammes, MD  Cardiologist:  Berniece Salines, DO  Electrophysiologist:  None   Referring MD: Serita Grammes, MD   No chief complaint on file.  From my heart I am doing very well but recently have had some ataxia  History of Present Illness:    Monica Anderson is a 76 y.o. female with a hx of  Apical variant of hypertrophic cardiomyopath ( on echo and confirmed on Cardiac MR), Paroxysmal atrial fibrillation diagnosed in 2015 on Cardizem and Eliquis, hypertension, hyperlipidemia, Mnire's disease, recently diagnosed BPPV   She was evaluated by EP on 03/10/2020 at that based on all the information reviewed it was recommenced to hold off o pursuing ICD - the patient was also on board with this.  I saw the patient in May 11, 2020 at that time she appeared doing well from a cardiovascular standpoint.  We talked about her diagnosis she had questions which I was able to answer.  At her visit she reported to be stable from a cardiovascular standpoint.  I have asked her to speak to her children about getting screening.  Since her visit with me she has been seen twice most recent by Florence Canner, NP, she was seen because she was pending colonoscopy.  She was cleared for her procedure.  Recently she had an injection in her knee and was in [place] while in [place] she started to feel palpitations.  She tells me that she was not feeling too good and the palpitation lasted longer she went to the emergency department at the hospital.  At that time she was noted to be atrial fibrillation rapid ventricular rate.  She was started on Cardizem.  She did have troponin elevation.  She was seen by cardiology at [facility] initial plan was to transfer her to [facility] but unfortunately no beds were available she was transition to [facility].  And over hospital she had a echocardiogram which was normal.  No heart cath was done.  Her Cardizem was increased from 180-240 milligrams.  No chest pain no shortness of breath.  But she tells me prior to that she has had some shortness of breath.  She is concerned.  Past Medical History:  Diagnosis Date   Anemia    Arthritis    Asymmetrical left sensorineural hearing loss 10/01/2019   Atrial fibrillation ([place]) 10/02/2019   Essential hypertension 10/03/2019   History of appendectomy 10/02/2019   History of atypical nevus 12/14/2016   History of colon polyps    History of malignant melanoma of skin 06/16/2017   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, left ([place]) 01/17/2017   Meniere's disease 08/29/2019   Mixed hyperlipidemia 10/03/2019   Multiple dysplastic nevi    Pleural effusion    Tubal ligation evaluation 10/02/2019    Past Surgical History:  Procedure Laterality Date   [place]   CATARACT EXTRACTION, BILATERAL  2019   COLONOSCOPY  11/07/2016   Colonic polyp status post polypectomy. Minimal sigmoid diverticulosis   DILATION AND CURETTAGE OF UTERUS     MOHS SURGERY Left 2019   Melanoma on left ear   TUBAL LIGATION  1977    Current Medications: Current  Meds  Medication Sig   B Complex-C-Folic Acid (HM SUPER VITAMIN B COMPLEX/C) TABS Take 1 tablet by mouth daily.   buPROPion (WELLBUTRIN XL) 150 MG 24 hr tablet Take 1 tablet by mouth daily.   Calcium Carbonate-Vit D-Min (CALCIUM 1200 PO) Take 1,200 mg by mouth daily.   diltiazem (CARDIZEM CD) 240 MG 24 hr capsule Take 240 mg by mouth daily.   ELIQUIS 5 MG TABS tablet Take 5 mg by mouth 2 (two) times daily.   estradiol (ESTRACE) 0.1 MG/GM vaginal cream APPLY TO THE LABIA OR VAGINALLY 3 TIMES A WEEK AT BEDTIME FOR MENOPAUSAL CHANGES.   famotidine (PEPCID) 20 MG tablet Take 20 mg by mouth  at bedtime.   FARXIGA 5 MG TABS tablet Take 5 mg by mouth daily.   ketoconazole (NIZORAL) 2 % cream ketoconazole 2 % topical cream  APPLY TO RASH IN SKIN FOLDS 2 TIMES DAILY X 14 DAYS AS NEEDED   metoprolol tartrate (LOPRESSOR) 25 MG tablet Take 1 tablet (25 mg total) by mouth every 8 (eight) hours as needed (If heartrate is greater that 130).   montelukast (SINGULAIR) 10 MG tablet Take 10 mg by mouth 3 times/day as needed-between meals & bedtime.   Multiple Vitamins-Minerals (HEALTHY EYES) TABS Take 1 tablet by mouth daily.   Omega-3 Fatty Acids (FISH OIL) 1000 MG CAPS Take 1,000 mg by mouth daily.    rosuvastatin (CRESTOR) 5 MG tablet Take 5 mg by mouth every evening.   spironolactone-hydrochlorothiazide (ALDACTAZIDE) 25-25 MG tablet Take 0.5 tablets by mouth daily.    Current Facility-Administered Medications for the 03/24/22 encounter (Office Visit) with Berniece Salines, DO  Medication   triamcinolone acetonide (KENALOG-40) injection 20 mg     Allergies:   Patient has no known allergies.   Social History   Socioeconomic History   Marital status: Married    Spouse name: Not on file   Number of children: 3   Years of education: Not on file   Highest education level: Not on file  Occupational History   Occupation: retired  Tobacco Use   Smoking status: Never   Smokeless tobacco: Never  Vaping Use   Vaping Use: Never used  Substance and Sexual Activity   Alcohol use: Yes    Comment: Wine once every 2 months   Drug use: Never   Sexual activity: Not on file  Other Topics Concern   Not on file  Social History Narrative   Right handed    caffeine: 1 cup of decaf in the AM   Lives with husband and one son   Social Determinants of Health   Financial Resource Strain: Not on file  Food Insecurity: Not on file  Transportation Needs: Not on file  Physical Activity: Not on file  Stress: Not on file  Social Connections: Not on file     Family History: The patient's family  history includes Atrial fibrillation in her mother; Diabetes in her father; Hypertension in her father and mother; Stroke in her father. There is no history of Colon cancer, Liver cancer, Stomach cancer, Rectal cancer, or Esophageal cancer.  ROS:   Review of Systems  Constitution: Negative for decreased appetite, fever and weight gain.  HENT: Negative for congestion, ear discharge, hoarse voice and sore throat.   Eyes: Negative for discharge, redness, vision loss in right eye and visual halos.  Cardiovascular: Negative for chest pain, dyspnea on exertion, leg swelling, orthopnea and palpitations.  Respiratory: Negative for cough, hemoptysis, shortness of breath and  snoring.   Endocrine: Negative for heat intolerance and polyphagia.  Hematologic/Lymphatic: Negative for bleeding problem. Does not bruise/bleed easily.  Skin: Negative for flushing, nail changes, rash and suspicious lesions.  Musculoskeletal: Negative for arthritis, joint pain, muscle cramps, myalgias, neck pain and stiffness.  Gastrointestinal: Negative for abdominal pain, bowel incontinence, diarrhea and excessive appetite.  Genitourinary: Negative for decreased libido, genital sores and incomplete emptying.  Neurological: Negative for brief paralysis, focal weakness, headaches and loss of balance.  Psychiatric/Behavioral: Negative for altered mental status, depression and suicidal ideas.  Allergic/Immunologic: Negative for HIV exposure and persistent infections.    EKGs/Labs/Other Studies Reviewed:    The following studies were reviewed today:   EKG: None today  Transthoracic echocardiogram This result has an attachment that is not available.  There is severe concentric left ventricular hypertrophy with septal and  posterior wall thickness ~ 1.5 cm.  Apical hypertrophy appears more predominant on contrast study.  No evidence of LVOT obstruction.  LVEF 65-70%  Mild tricuspid regurgitation   Left Ventricle  Left  ventricle size is normal. There is severe concentric left ventricular hypertrophy with septal and posterior wall thickness ~ 1.5 cm. Apical hypertrophy appears more predominant on contrast study. Systolic function is normal. EF: 65-70%. Wall motion is normal. Doppler parameters consistent with mild diastolic dysfunction and low to normal LA pressure.   Right Ventricle  Right ventricle size is normal. Systolic function is normal. Normal tricuspid annular plane systolic excursion (TAPSE) >1.7 cm. Normal systolic excursion velocity by TDI (>9.5 cm/s).   Left Atrium  Left atrium size is normalLeft atrium volume index is normal (16-34 mL/m2).   Right Atrium  Right atrium size is normal.   IVC/SVC  The inferior vena cava demonstrates a diameter of <=2.1 cm and collapses >50%; therefore, the right atrial pressure is estimated at 3 mmHg.   Mitral Valve  Mitral valve structure is normal. There is mild posterior annular calcification. There is trace regurgitation.   Tricuspid Valve  Tricuspid valve structure is normal. There is mild regurgitation. The right ventricular systolic pressure is normal (<36 mmHg).   Aortic Valve  The aortic valve is tricuspid. The leaflets are mildly thickened and exhibit normal excursion. The leaflets are mildly calcified. There is no regurgitation or stenosis.   Pulmonic Valve  The pulmonic valve was not well visualized. Trace regurgitation.   Ascending Aorta  The aortic root is normal in size. The ascending aorta is normal in size.   Pericardium  There is no pericardial effusion.   Study Details  A complete echo was performed using complete 2D, color flow Doppler and spectral Doppler. During the study the apical, parasternal, subcostal and suprasternal views were captured. Patient has normal sinus rhythm. Overall the study quality was fair. The study was technically difficult. The study was difficult due to patient's body habitus. The imaging is on file and  stored in a permanent location.   Other studies Reviewed: Additional studies/ records that were reviewed today include: CMRI  Review of the above records today demonstrates:  1. LV apical hypertrophy measuring up to 16m (basal posterior wall measures 7472m, consistent with apical hypertrophic cardiomyopathy 2.  No late gadolinium enhancement to suggest myocardial scar 3.  Normal LV size with hyperdynamic systolic function (EF 7449%4.  Normal RV size and systolic function (EF 6544%5. Small pericardial effusion measures up to 72m60mdjacent to LV inferior wall   Coronary CT 03/10/20 1. Coronary calcium score of 3. This was 31st percentile for  age and sex matched control.   2. Normal coronary origin with right dominance.   3. Nonobstructive CAD with calcified plaque in the mid LAD and mid D1 causing minimal (0-24%) stenosis   Cardiac monitor 02/11/2020 personally reviewed 4 Ventricular Tachycardia runs occurred, the run with the fastest interval lasting 14.7 secs with a maximum rate of 152 bpm (average 113 bpm); the run with the fastest interval was also the longest.   36 Supraventricular Tachycardia runs occurred, the run with the fastest interval lasting 4 beats with a maximum rate of 158 bpm, the longest lasting 14.3 secs with an average rate of 109 bpm.  Recent Labs: No results found for requested labs within last 365 days.  Recent Lipid Panel No results found for: "CHOL", "TRIG", "HDL", "CHOLHDL", "VLDL", "LDLCALC", "LDLDIRECT"  Physical Exam:    VS:  BP 138/78   Pulse 60   Ht '5\' 7"'$  (1.702 m)   Wt 195 lb (88.5 kg)   SpO2 98%   BMI 30.54 kg/m     Wt Readings from Last 3 Encounters:  03/24/22 195 lb (88.5 kg)  03/17/22 198 lb (89.8 kg)  01/25/22 198 lb (89.8 kg)     GEN: Well nourished, well developed in no acute distress HEENT: Normal NECK: No JVD; No carotid bruits LYMPHATICS: No lymphadenopathy CARDIAC: S1S2 noted,RRR, no murmurs, rubs, gallops RESPIRATORY:  Clear  to auscultation without rales, wheezing or rhonchi  ABDOMEN: Soft, non-tender, non-distended, +bowel sounds, no guarding. EXTREMITIES: No edema, No cyanosis, no clubbing MUSCULOSKELETAL:  No deformity  SKIN: Warm and dry NEUROLOGIC:  Alert and oriented x 3, non-focal PSYCHIATRIC:  Normal affect, good insight  ASSESSMENT:    1. Paroxysmal atrial fibrillation (HCC)   2. Apical variant hypertrophic cardiomyopathy ([place])   3. Essential hypertension   4. Nonsustained ventricular tachycardia ([place])   5. PAT (paroxysmal atrial tachycardia)    PLAN:    She is doing well since her recent hospitalization.  We will keep her on the increased dose of Cardizem hopefully this will help.  But her echocardiogram that was recently done.  She did note that she has spoken to her adult children and she will check with them again to make sure that they have not gotten tested.  Continue the patient on her Eliquis 5 mg twice daily for paroxysmal atrial fibrillation.  The patient is in agreement with the above plan. The patient left the office in stable condition.  The patient will follow up in 4 months.   Medication Adjustments/Labs and Tests Ordered: Current medicines are reviewed at length with the patient today.  Concerns regarding medicines are outlined above.  Orders Placed This Encounter  Procedures   EKG 12-Lead    Meds ordered this encounter  Medications   metoprolol tartrate (LOPRESSOR) 25 MG tablet    Sig: Take 1 tablet (25 mg total) by mouth every 8 (eight) hours as needed (If heartrate is greater that 130).    Dispense:  30 tablet    Refill:  5     Patient Instructions  Medication Instructions:  Your physician has recommended you make the following change in your medication:  START: Lopressor '25mg'$  as needed if your heart rate is greater than 130 every 8 hours.  *If you need a refill on your cardiac medications before your next appointment, please call your pharmacy*   Lab  Work: NONE If you have labs (blood work) drawn today and your tests are completely normal, you will receive your results only  by: MyChart Message (if you have MyChart) OR A paper copy in the mail If you have any lab test that is abnormal or we need to change your treatment, we will call you to review the results.   Testing/Procedures: NONE   Follow-Up: At [facility], you and your health needs are our priority.  As part of our continuing mission to provide you with exceptional heart care, we have created designated Provider Care Teams.  These Care Teams include your primary Cardiologist (physician) and Advanced Practice Providers (APPs -  Physician Assistants and Nurse Practitioners) who all work together to provide you with the care you need, when you need it.  We recommend signing up for the patient portal called "MyChart".  Sign up information is provided on this After Visit Summary.  MyChart is used to connect with patients for Virtual Visits (Telemedicine).  Patients are able to view lab/test results, encounter notes, upcoming appointments, etc.  Non-urgent messages can be sent to your provider as well.   To learn more about what you can do with MyChart, go to [email or web address].    Your next appointment:   4 month(s)  The format for your next appointment:   In Person  Provider:   Berniece Salines, DO      Adopting a Healthy Lifestyle.  Know what a healthy weight is for you (roughly BMI <25) and aim to maintain this   Aim for 7+ servings of fruits and vegetables daily   65-80+ fluid ounces of water or unsweet tea for healthy kidneys   Limit to max 1 drink of alcohol per day; avoid smoking/tobacco   Limit animal fats in diet for cholesterol and heart health - choose grass fed whenever available   Avoid highly processed foods, and foods high in saturated/trans fats   Aim for low stress - take time to unwind and care for your mental health   Aim for 150 min of  moderate intensity exercise weekly for heart health, and weights twice weekly for bone health   Aim for 7-9 hours of sleep daily   When it comes to diets, agreement about the perfect plan isnt easy to find, even among the experts. Experts at the [employer] developed an idea known as the Healthy Eating Plate. Just imagine a plate divided into logical, healthy portions.   The emphasis is on diet quality:   Load up on vegetables and fruits - one-half of your plate: Aim for color and variety, and remember that potatoes dont count.   Go for whole grains - one-quarter of your plate: Whole wheat, barley, wheat berries, quinoa, oats, brown rice, and foods made with them. If you want pasta, go with whole wheat pasta.   Protein power - one-quarter of your plate: Fish, chicken, beans, and nuts are all healthy, versatile protein sources. Limit red meat.   The diet, however, does go beyond the plate, offering a few other suggestions.   Use healthy plant oils, such as olive, canola, soy, corn, sunflower and peanut. Check the labels, and avoid partially hydrogenated oil, which have unhealthy trans fats.   If youre thirsty, drink water. Coffee and tea are good in moderation, but skip sugary drinks and limit milk and dairy products to one or two daily servings.   The type of carbohydrate in the diet is more important than the amount. Some sources of carbohydrates, such as vegetables, fruits, whole grains, and beans-are healthier than others.   Finally, stay active  Rolly Pancake, DO  03/24/2022 10:07 PM    [facility]

## 2022-04-11 DIAGNOSIS — R350 Frequency of micturition: Secondary | ICD-10-CM | POA: Diagnosis not present

## 2022-04-16 DIAGNOSIS — M7989 Other specified soft tissue disorders: Secondary | ICD-10-CM | POA: Diagnosis not present

## 2022-04-16 DIAGNOSIS — R600 Localized edema: Secondary | ICD-10-CM | POA: Diagnosis not present

## 2022-04-16 DIAGNOSIS — L03115 Cellulitis of right lower limb: Secondary | ICD-10-CM | POA: Diagnosis not present

## 2022-05-12 DIAGNOSIS — M170 Bilateral primary osteoarthritis of knee: Secondary | ICD-10-CM | POA: Diagnosis not present

## 2022-05-21 IMAGING — CT CT HEART MORP W/ CTA COR W/ SCORE W/ CA W/CM &/OR W/O CM
4 of 7 series · 8 of 20 positions shown, 9 images · IV contrast (APPLIED)
Comparison: 08/17/2015 chest radiograph. 03/13/2014 CTA chest. Both
from [facility].
COMPARISON: 08/17/2015 chest radiograph. 03/13/2014 CTA chest. Both
from [facility].

Addendum:
EXAM:
OVER-READ INTERPRETATION  CT CHEST

The following report is an over-read performed by radiologist Dr.
Rin De Jesus [REDACTED] on 03/09/2020. This over-read
does not include interpretation of cardiac or coronary anatomy or
pathology. The coronary CTA interpretation by the cardiologist is
attached.
CLINICAL DATA: 73F with NSVT
Cardiac/Coronary CTA
TECHNIQUE: The patient was scanned on a Phillips Force scanner.

[Series 6: best diast 70 % · axial · 0.39mm/px · z∈[-255,-207]mm · 2 of 358 slices shown]
[im 120/358  vessel]
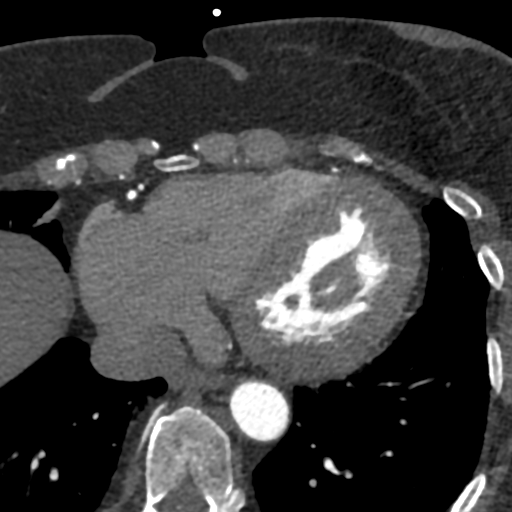
[im 239/358  vessel]
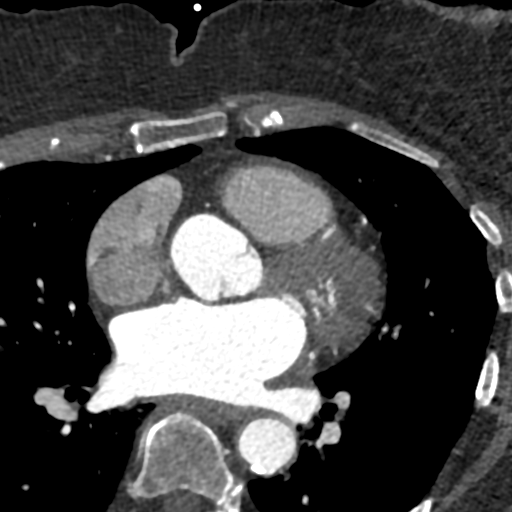

[Series 7: best syst · axial · 0.39mm/px · z∈[-255,-207]mm · 2 of 358 slices shown, 3 images]
[im 120/358  vessel]
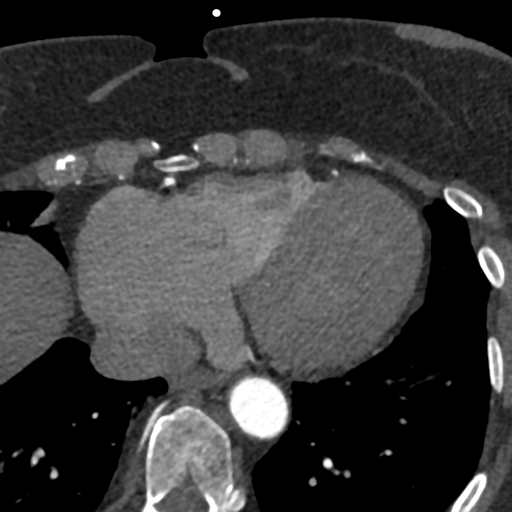
[im 120/358  lung]
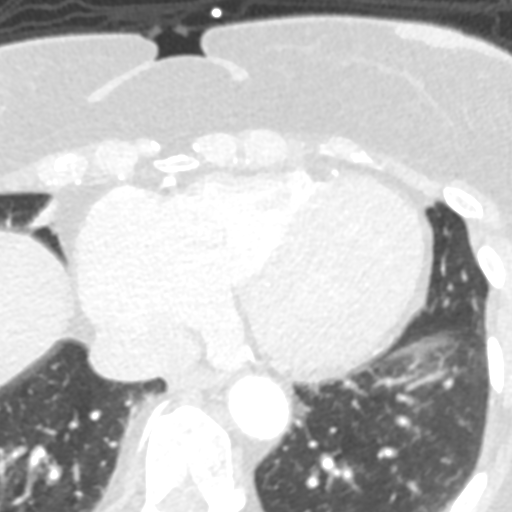
[im 239/358  vessel]
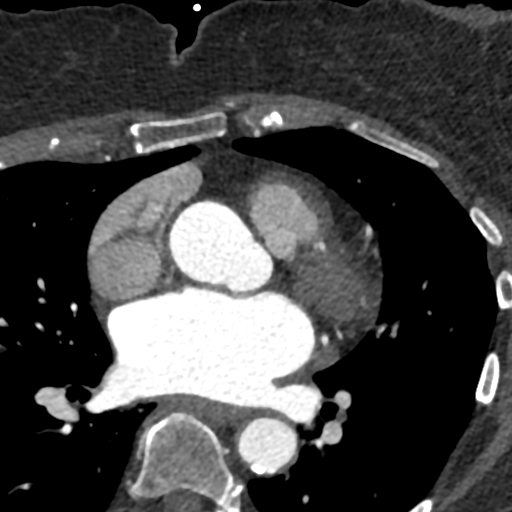

[Series 8: ts diast sharp 70 % · axial · 0.39mm/px · z∈[-255,-207]mm · 2 of 358 slices shown]
[im 120/358  lung]
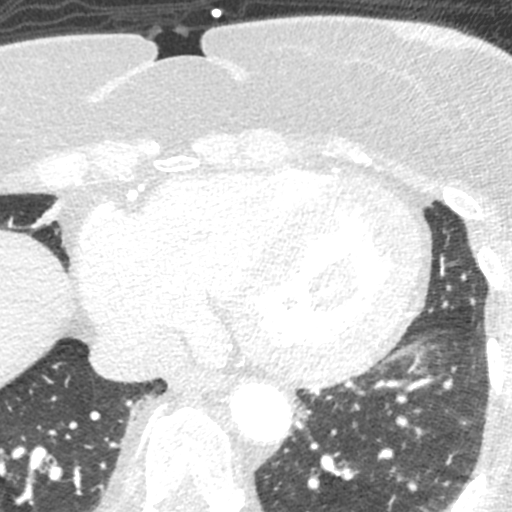
[im 239/358  lung]
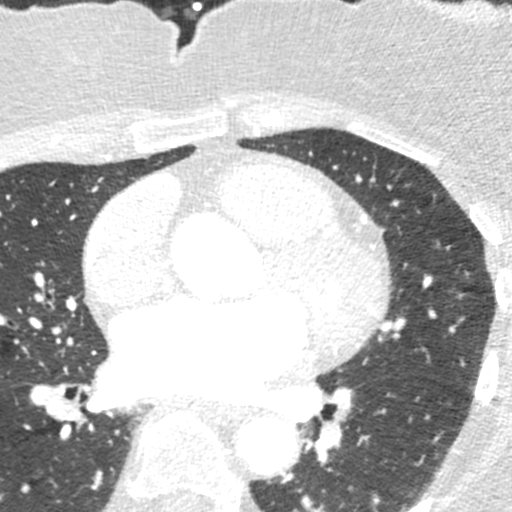

[Series 9: ts syst sharp · axial · 0.39mm/px · z∈[-255,-207]mm · 2 of 358 slices shown]
[im 120/358  lung]
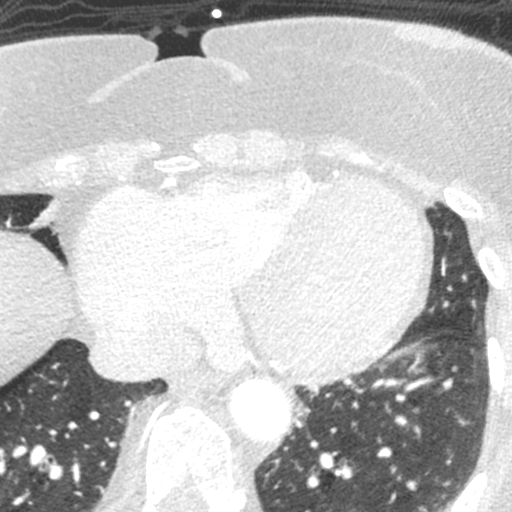
[im 239/358  lung]
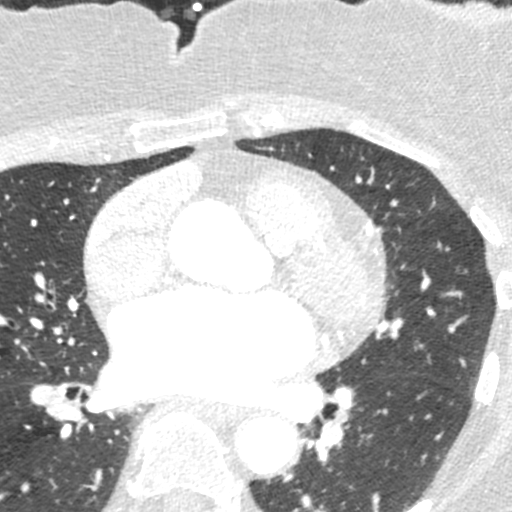

[8 of 20 positions shown; findings below may reference images not displayed]

FINDINGS: Vascular: Aortic atherosclerosis. Tortuous thoracic aorta. No
central pulmonary embolism, on this non-dedicated study.

Mediastinum/Nodes: No imaged thoracic adenopathy.

Lungs/Pleura: No pleural fluid.  Clear imaged lungs.

Upper Abdomen: Normal imaged portions of the liver, spleen, stomach.

Musculoskeletal: Moderate thoracic spondylosis.
IMPRESSION: 1. No acute findings in the imaged extracardiac chest.
2. Aortic Atherosclerosis (O31K0-KRC.C).
FINDINGS: A 100 kV prospective scan was triggered in the descending thoracic
aorta at 111 HU's. Axial non-contrast 3 mm slices were carried out
through the heart. The data set was analyzed on a dedicated work
station and scored using the Agatson method. Gantry rotation speed
was 250 msecs and collimation was .6 mm. 0.8 mg of sl NTG was given.
The 3D data set was reconstructed in 5% intervals of the 35-75% of
the R-R cycle. Phases were analyzed on a dedicated work station
using MPR, MIP and VRT modes. The patient received 80 cc of
contrast.

Coronary Arteries:  Normal coronary origin.  Right dominance.

RCA is a large dominant artery that gives rise to PDA and PLA. There
is no plaque.

Left main is a large artery that gives rise to LAD, ramus, and LCX
arteries.

LAD is a large vessel. There is calcified plaque in the mid LAD
causing 0-24%. There is calcified plaque in the mid D1 causing 0-24%
stenosis

LCX is a non-dominant artery that gives rise to one large OM1
branch. There is no plaque.

There is a small ramus that has no plaque.

Other findings:

Left Ventricle: Normal size.  Apical hypertrophy

Left Atrium: Mild enlargement

Pulmonary Veins: Normal configuration

Right Ventricle: Normal size

Right Atrium: Mild enlargement

Cardiac valves: Mild AV calcifications

Thoracic aorta: Normal size

Pulmonary Arteries: Normal size

Systemic Veins: Normal drainage

Pericardium: Normal thickness
IMPRESSION: 1. Coronary calcium score of 3. This was 31st percentile for age and
sex matched control.

2. Normal coronary origin with right dominance.

3. Nonobstructive CAD with calcified plaque in the mid LAD and mid
D1 causing minimal (0-24%) stenosis

CAD-RADS 1. Minimal non-obstructive CAD (0-24%). Consider
non-atherosclerotic causes of chest pain. Consider preventive
therapy and risk factor modification.

*** End of Addendum ***
EXAM:
OVER-READ INTERPRETATION  CT CHEST

The following report is an over-read performed by radiologist Dr.
Rin De Jesus [REDACTED] on 03/09/2020. This over-read
does not include interpretation of cardiac or coronary anatomy or
pathology. The coronary CTA interpretation by the cardiologist is
attached.
FINDINGS: Vascular: Aortic atherosclerosis. Tortuous thoracic aorta. No
central pulmonary embolism, on this non-dedicated study.

Mediastinum/Nodes: No imaged thoracic adenopathy.

Lungs/Pleura: No pleural fluid.  Clear imaged lungs.

Upper Abdomen: Normal imaged portions of the liver, spleen, stomach.

Musculoskeletal: Moderate thoracic spondylosis.
IMPRESSION: 1. No acute findings in the imaged extracardiac chest.
2. Aortic Atherosclerosis (O31K0-KRC.C).

## 2022-06-15 ENCOUNTER — Telehealth: Payer: Self-pay | Admitting: Cardiology

## 2022-06-15 MED ORDER — DILTIAZEM HCL ER COATED BEADS 240 MG PO CP24: 240.0000 mg | ORAL_CAPSULE | Freq: Every day | ORAL | 3 refills | Status: DC

## 2022-06-15 NOTE — Telephone Encounter (Signed)
*  STAT* If patient is at the pharmacy, call can be transferred to refill team.   1. Which medications need to be refilled? (please list name of each medication and dose if known)   diltiazem (CARDIZEM CD) 240 MG 24 hr capsule   2. Which pharmacy/location (including street and city if local pharmacy) is medication to be sent to?  [id number]- [place], [place] - [address]  3. Do they need a 30 day or 90 day supply? 90 day  Patient stated this medication was previously filled and 180 mg and she needs this to be filled at 240 mg as prescribed.  Patient stated she still has some of this medication.

## 2022-06-29 ENCOUNTER — Telehealth: Payer: Self-pay

## 2022-06-29 NOTE — Telephone Encounter (Signed)
   Pre-operative Risk Assessment    Patient Name: Monica Anderson  DOB: February 24, 1946 MRN: [id number]      Request for Surgical Clearance    Procedure:   Right total knee arthroplasty   Date of Surgery:  Clearance 07/18/22                                 Surgeon:  Dr. Gaynelle Arabian  Surgeon's Group or Practice Name:  [facility]  Phone number:  [phone] Fax number:  [phone]   Type of Clearance Requested:   - Medical  - Pharmacy:  Hold Apixaban (Eliquis) Need instructions    Type of Anesthesia:   Choice    Additional requests/questions:    [name]   06/29/2022, 4:20 PM

## 2022-07-01 NOTE — Telephone Encounter (Signed)
Patient with diagnosis of PAF(paroxysmal atrial fibrillation) on Eliquis for anticoagulation.    Procedure: Right total knee arthroplasty   Date of procedure: 07/18/2022   CHA2DS2-VASc Score = 4   This indicates a 4.8% annual risk of stroke. The patient's score is based upon: CHF History: 0 HTN History: 1 Diabetes History: 0 Stroke History: 0 Vascular Disease History: 0 Age Score: 2 Gender Score: 1      CrCl 40 ml/min Adj Wt  (03/21/2022) Platelet count 189 (03/20/2022)   Per office protocol, patient can hold Eliquis for 3 days prior to procedure.   Patient will not need bridging with Lovenox (enoxaparin) around procedure.  **This guidance is not considered finalized until pre-operative APP has relayed final recommendations.**

## 2022-07-01 NOTE — Telephone Encounter (Signed)
   Name: Monica Anderson  DOB: 1946-05-01  MRN: [id number]  Primary Cardiologist: Berniece Salines, DO  Chart reviewed as part of pre-operative protocol coverage. Because of Joellen Runions's past medical history and time since last visit, she will require a follow-up in-office visit in order to better assess preoperative cardiovascular risk.  Pre-op covering staff: - Please schedule appointment and call patient to inform them. If patient already had an upcoming appointment within acceptable timeframe, please add "pre-op clearance" to the appointment notes so provider is aware. - Please contact requesting surgeon's office via preferred method (i.e, phone, fax) to inform them of need for appointment prior to surgery.  Per office protocol, patient can hold Eliquis for 3 days prior to procedure.   Patient will not need bridging with Lovenox (enoxaparin) around procedure.    Elgie Collard, PA-C  07/01/2022, 12:24 PM

## 2022-07-01 NOTE — Telephone Encounter (Signed)
I s/w the pt and informed that she will need a sooner appt for pre op clearance. I did not have anything sooner with Dr. Harriet Masson, however the pt is fine seeing APP for pre op clearance. I stated that APP will probably have her see Dr. Harriet Masson in 2-3 months for her yrly f/u. Pt is agreeable to plan of care and thanked me for the help. Pt agrees to cancel the 07/26/22 Dr. Harriet Masson appt and will reschedule after she see's APP for pre op clearance. I will update all parties involved.

## 2022-07-05 DIAGNOSIS — M1712 Unilateral primary osteoarthritis, left knee: Secondary | ICD-10-CM | POA: Diagnosis not present

## 2022-07-05 NOTE — H&P (Signed)
TOTAL KNEE ADMISSION H&P  Patient is being admitted for right total knee arthroplasty.  Subjective:  Chief Complaint: Right knee pain.  HPI: Monica Anderson, 77 y.o. female has a history of pain and functional disability in the right knee due to arthritis and has failed non-surgical conservative treatments for greater than 12 weeks to include corticosteriod injections, viscosupplementation injections, and activity modification. Onset of symptoms was gradual, starting 4 years ago with gradually worsening course since that time. The patient noted no past surgery on the right knee.  Patient currently rates pain in the right knee at 6 out of 10 with activity. Patient has worsening of pain with activity and weight bearing and pain that interferes with activities of daily living. Patient has evidence of subchondral cysts, periarticular osteophytes, and joint space narrowing by imaging studies. There is no active infection.  Patient Active Problem List   Diagnosis Date Noted   Osteoarthritis of right knee 05/11/2020   Osteoarthritis of left knee 03/18/2020   Nonspecific abnormal electrocardiogram (ECG) (EKG) 02/18/2020   Nonsustained ventricular tachycardia (HCC) 02/18/2020   Abnormal electrocardiogram 02/18/2020   PAT (paroxysmal atrial tachycardia) 02/18/2020   Dizziness 01/07/2020   Apical variant hypertrophic cardiomyopathy ([place]) 01/07/2020   Acquired trigger finger of left middle finger 12/12/2019   Essential hypertension 10/03/2019   Mixed hyperlipidemia 10/03/2019   Atrial fibrillation ([place]) 10/02/2019   History of appendectomy 10/02/2019   Tubal ligation evaluation 10/02/2019   Asymmetrical left sensorineural hearing loss 10/01/2019   Meniere's disease 08/29/2019   History of malignant melanoma of skin 06/16/2017   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, left ([place]) 01/17/2017   History of atypical nevus 12/14/2016    Past Medical History:  Diagnosis Date    Anemia    Arthritis    Asymmetrical left sensorineural hearing loss 10/01/2019   Atrial fibrillation ([place]) 10/02/2019   Essential hypertension 10/03/2019   History of appendectomy 10/02/2019   History of atypical nevus 12/14/2016   History of colon polyps    History of malignant melanoma of skin 06/16/2017   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, left ([place]) 01/17/2017   Meniere's disease 08/29/2019   Mixed hyperlipidemia 10/03/2019   Multiple dysplastic nevi    Pleural effusion    Tubal ligation evaluation 10/02/2019    Past Surgical History:  Procedure Laterality Date   [place]   CATARACT EXTRACTION, BILATERAL  2019   COLONOSCOPY  11/07/2016   Colonic polyp status post polypectomy. Minimal sigmoid diverticulosis   DILATION AND CURETTAGE OF UTERUS     MOHS SURGERY Left 2019   Melanoma on left ear   TUBAL LIGATION  1977    Prior to Admission medications   Medication Sig Start Date End Date Taking? Authorizing Provider  B Complex-C-Folic Acid (HM SUPER VITAMIN B COMPLEX/C) TABS Take 1 tablet by mouth daily.    [provider]  buPROPion (WELLBUTRIN XL) 150 MG 24 hr tablet Take 1 tablet by mouth daily.    [provider]  Calcium Carbonate-Vit D-Min (CALCIUM 1200 PO) Take 1,200 mg by mouth daily.    [provider]  diltiazem (CARDIZEM CD) 240 MG 24 hr capsule Take 1 capsule (240 mg total) by mouth daily. 06/15/22   Tobb, Kardie, DO  ELIQUIS 5 MG TABS tablet Take 5 mg by mouth 2 (two) times daily. 01/16/18   [provider]  estradiol (ESTRACE) 0.1 MG/GM vaginal cream  APPLY TO THE LABIA OR VAGINALLY 3 TIMES A WEEK AT BEDTIME FOR MENOPAUSAL CHANGES.    [provider]  famotidine (PEPCID) 20 MG tablet Take 20 mg by mouth at bedtime. 08/09/21   [provider]  FARXIGA 5 MG TABS tablet Take 5 mg by mouth daily. 03/02/22   [provider]  ketoconazole (NIZORAL) 2 %  cream ketoconazole 2 % topical cream  APPLY TO RASH IN SKIN FOLDS 2 TIMES DAILY X 14 DAYS AS NEEDED    [provider]  metoprolol tartrate (LOPRESSOR) 25 MG tablet Take 1 tablet (25 mg total) by mouth every 8 (eight) hours as needed (If heartrate is greater that 130). 03/24/22   Tobb, Kardie, DO  montelukast (SINGULAIR) 10 MG tablet Take 10 mg by mouth 3 times/day as needed-between meals & bedtime. 08/09/21   [provider]  Multiple Vitamins-Minerals (HEALTHY EYES) TABS Take 1 tablet by mouth daily.    [provider]  Omega-3 Fatty Acids (FISH OIL) 1000 MG CAPS Take 1,000 mg by mouth daily.     [provider]  rosuvastatin (CRESTOR) 5 MG tablet Take 5 mg by mouth every evening. 12/10/17   [provider]  spironolactone-hydrochlorothiazide (ALDACTAZIDE) 25-25 MG tablet Take 0.5 tablets by mouth daily.     [provider]    No Known Allergies  Social History   Socioeconomic History   Marital status: Married    Spouse name: Not on file   Number of children: 3   Years of education: Not on file   Highest education level: Not on file  Occupational History   Occupation: retired  Tobacco Use   Smoking status: Never   Smokeless tobacco: Never  Vaping Use   Vaping Use: Never used  Substance and Sexual Activity   Alcohol use: Yes    Comment: Wine once every 2 months   Drug use: Never   Sexual activity: Not on file  Other Topics Concern   Not on file  Social History Narrative   Right handed    caffeine: 1 cup of decaf in the AM   Lives with husband and one son   Social Determinants of Health   Financial Resource Strain: Not on file  Food Insecurity: Not on file  Transportation Needs: Not on file  Physical Activity: Not on file  Stress: Not on file  Social Connections: Not on file  Intimate Partner Violence: Not on file    Tobacco Use: Low Risk  (03/24/2022)   Patient History    Smoking Tobacco Use: Never    Smokeless  Tobacco Use: Never    Passive Exposure: Not on file   Social History   Substance and Sexual Activity  Alcohol Use Yes   Comment: Wine once every 2 months    Family History  Problem Relation Age of Onset   Hypertension Mother    Atrial fibrillation Mother    Hypertension Father    Stroke Father    Diabetes Father    Colon cancer Neg Hx    Liver cancer Neg Hx    Stomach cancer Neg Hx    Rectal cancer Neg Hx    Esophageal cancer Neg Hx     ROS  Objective:  Physical Exam: The patient is a pleasant, well-developed female alert and oriented in no apparent distress.    The patient has an antalgic gait pattern favoring the right side without the use of assistive devices.    Bilateral Hip Exam:  The range of motion: normal without discomfort.    Right Knee Exam:  Slight varus deformity.  No effusion present. No swelling present.  The range of motion is: 10 to 125 degrees.  Moderate crepitus on range of motion of the knee.  Positive medial joint line tenderness.  No lateral joint line tenderness.  The knee is stable.    Left Knee Exam:  No effusion present. No swelling present.  The Range of motion is: 0 to 125 degrees.  Positive crepitus on range of motion of the knee.  Positive medial greater than lateral joint line tenderness.  The knee is stable.    The patient's sensation and motor function are intact in their lower extremities. Their distal pulses are 2+. The bilateral calves are soft and non-tender.  IMAGING RESULTS   AP and lateral of the bilateral knees dated 09/14/2021 demonstrate bone-on-bone arthritis in the medial compartment of both knees with slightly worse on the left than the right, but with more varus on the right than the left. She also has significant patellofemoral arthritis on the right.   Assessment/Plan:  End stage arthritis, right knee   The patient history, physical examination, clinical judgment of the provider and imaging studies are  consistent with end stage degenerative joint disease of the right knee and total knee arthroplasty is deemed medically necessary. The treatment options including medical management, injection therapy arthroscopy and arthroplasty were discussed at length. The risks and benefits of total knee arthroplasty were presented and reviewed. The risks due to aseptic loosening, infection, stiffness, patella tracking problems, thromboembolic complications and other imponderables were discussed. The patient acknowledged the explanation, agreed to proceed with the plan and consent was signed. Patient is being admitted for inpatient treatment for surgery, pain control, PT, OT, prophylactic antibiotics, VTE prophylaxis, progressive ambulation and ADLs and discharge planning. The patient is planning to be discharged home and will undergo outpatient PT.   Patient's anticipated LOS is less than 2 midnights, meeting these requirements: - Younger than 64 - Lives within 1 hour of care - Has a competent adult at home to recover with post-op recover - NO history of  - Chronic pain requiring opiods  - Diabetes  - Coronary Artery Disease  - Heart failure  - Heart attack  - Stroke  - DVT/VTE  - Respiratory Failure/COPD  - Renal failure  - Anemia  - Advanced Liver disease   Therapy Plans: ProPT [place] Disposition: Home with husband Planned DVT Prophylaxis: Eliquis (a fib) DME Needed: Gilford Rile ordered PCP: Serita Grammes, MD - clearance form given to pt, appt this week Cardiologist: Berniece Salines, MD - clearance form given to pt, appt this week TXA: IV Allergies: NKDA Anesthesia Concerns: None BMI: 32.8 Last HgbA1c: Not diabetic  Pharm[facility]: [facility]  - Patient was instructed on what medications to stop prior to surgery. - Follow-up visit in 2 weeks with Dr. Wynelle Link - Begin physical therapy following surgery - Pre-operative lab work as pre-surgical testing - Prescriptions will be  provided in hospital at time of discharge  Shearon Balo, PA-C Orthopedic Surgery [facility] [place]

## 2022-07-06 ENCOUNTER — Encounter: Payer: Self-pay | Admitting: Nurse Practitioner

## 2022-07-06 ENCOUNTER — Ambulatory Visit: Payer: Medicare Other | Attending: Nurse Practitioner | Admitting: Nurse Practitioner

## 2022-07-06 VITALS — BP 148/72 | HR 68 | Ht 66.0 in | Wt 197.4 lb

## 2022-07-06 DIAGNOSIS — I422 Other hypertrophic cardiomyopathy: Secondary | ICD-10-CM | POA: Insufficient documentation

## 2022-07-06 DIAGNOSIS — E782 Mixed hyperlipidemia: Secondary | ICD-10-CM | POA: Diagnosis not present

## 2022-07-06 DIAGNOSIS — Z01810 Encounter for preprocedural cardiovascular examination: Secondary | ICD-10-CM | POA: Insufficient documentation

## 2022-07-06 DIAGNOSIS — N3946 Mixed incontinence: Secondary | ICD-10-CM | POA: Diagnosis not present

## 2022-07-06 DIAGNOSIS — I480 Paroxysmal atrial fibrillation: Secondary | ICD-10-CM | POA: Diagnosis not present

## 2022-07-06 DIAGNOSIS — I10 Essential (primary) hypertension: Secondary | ICD-10-CM | POA: Diagnosis not present

## 2022-07-06 NOTE — Patient Instructions (Addendum)
Medication Instructions:  Your physician recommends that you continue on your current medications as directed. Please refer to the Current Medication list given to you today.   *If you need a refill on your cardiac medications before your next appointment, please call your pharmacy*   Lab Work: NONE ordered at this time of appointment   If you have labs (blood work) drawn today and your tests are completely normal, you will receive your results only by: [place] (if you have MyChart) OR A paper copy in the mail If you have any lab test that is abnormal or we need to change your treatment, we will call you to review the results.   Testing/Procedures: NONE ordered at this time of appointment     Follow-Up: At [facility], you and your health needs are our priority.  As part of our continuing mission to provide you with exceptional heart care, we have created designated Provider Care Teams.  These Care Teams include your primary Cardiologist (physician) and Advanced Practice Providers (APPs -  Physician Assistants and Nurse Practitioners) who all work together to provide you with the care you need, when you need it.  We recommend signing up for the patient portal called "MyChart".  Sign up information is provided on this After Visit Summary.  MyChart is used to connect with patients for Virtual Visits (Telemedicine).  Patients are able to view lab/test results, encounter notes, upcoming appointments, etc.  Non-urgent messages can be sent to your provider as well.   To learn more about what you can do with MyChart, go to [email or web address].    Your next appointment:   4-6 month(s)  Provider:   Berniece Salines, DO     Other Instructions Please hold Eliquis 3 days prior to procedure.

## 2022-07-06 NOTE — Progress Notes (Signed)
Office Visit    Patient Name: Monica Anderson Date of Encounter: 07/06/2022  Primary Care Provider:  Serita Grammes, MD Primary Cardiologist:  Berniece Salines, DO  Chief Complaint    77 year old female with a history of apical variant hypertrophic cardiomyopathy (noted on echo and confirmed on cardiac MR), paroxysmal atrial fibrillation diagnosed in 2015 on diltiazem and Eliquis, hypertension, hyperlipidemia, Mnire's disease, and BPPV who presents for follow-up related to hypertrophic cardiomyopathy and for preoperative cardiac evaluation.  Past Medical History    Past Medical History:  Diagnosis Date   Anemia    Arthritis    Asymmetrical left sensorineural hearing loss 10/01/2019   Atrial fibrillation ([place]) 10/02/2019   Essential hypertension 10/03/2019   History of appendectomy 10/02/2019   History of atypical nevus 12/14/2016   History of colon polyps    History of malignant melanoma of skin 06/16/2017   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, left ([place]) 01/17/2017   Meniere's disease 08/29/2019   Mixed hyperlipidemia 10/03/2019   Multiple dysplastic nevi    Pleural effusion    Tubal ligation evaluation 10/02/2019   Past Surgical History:  Procedure Laterality Date   [place]   CATARACT EXTRACTION, BILATERAL  2019   COLONOSCOPY  11/07/2016   Colonic polyp status post polypectomy. Minimal sigmoid diverticulosis   DILATION AND CURETTAGE OF UTERUS     MOHS SURGERY Left 2019   Melanoma on left ear   TUBAL LIGATION  1977    Allergies  No Known Allergies   Labs/Other Studies Reviewed    The following studies were reviewed today: Transthoracic echocardiogram 03/21/2022: This result has an attachment that is not available.  There is severe concentric left ventricular hypertrophy with septal and  posterior wall thickness ~ 1.5 cm.  Apical hypertrophy appears more predominant on contrast study.  No evidence  of LVOT obstruction.  LVEF 65-70%  Mild tricuspid regurgitation   Left Ventricle  Left ventricle size is normal. There is severe concentric left ventricular hypertrophy with septal and posterior wall thickness ~ 1.5 cm. Apical hypertrophy appears more predominant on contrast study. Systolic function is normal. EF: 65-70%. Wall motion is normal. Doppler parameters consistent with mild diastolic dysfunction and low to normal LA pressure.   Right Ventricle  Right ventricle size is normal. Systolic function is normal. Normal tricuspid annular plane systolic excursion (TAPSE) >1.7 cm. Normal systolic excursion velocity by TDI (>9.5 cm/s).   Left Atrium  Left atrium size is normalLeft atrium volume index is normal (16-34 mL/m2).   Right Atrium  Right atrium size is normal.   IVC/SVC  The inferior vena cava demonstrates a diameter of <=2.1 cm and collapses >50%; therefore, the right atrial pressure is estimated at 3 mmHg.   Mitral Valve  Mitral valve structure is normal. There is mild posterior annular calcification. There is trace regurgitation.   Tricuspid Valve  Tricuspid valve structure is normal. There is mild regurgitation. The right ventricular systolic pressure is normal (<36 mmHg).   Aortic Valve  The aortic valve is tricuspid. The leaflets are mildly thickened and exhibit normal excursion. The leaflets are mildly calcified. There is no regurgitation or stenosis.   Pulmonic Valve  The pulmonic valve was not well visualized. Trace regurgitation.   Ascending Aorta  The aortic root is normal in size. The ascending aorta is normal in size.   Pericardium  There is no pericardial effusion.   Study Details  A complete  echo was performed using complete 2D, color flow Doppler and spectral Doppler. During the study the apical, parasternal, subcostal and suprasternal views were captured. Patient has normal sinus rhythm. Overall the study quality was fair. The study was technically  difficult. The study was difficult due to patient's body habitus. The imaging is on file and stored in a permanent location.   Other studies Reviewed: Additional studies/ records that were reviewed today include: CMRI  Review of the above records today demonstrates:  1. LV apical hypertrophy measuring up to 574m (basal posterior wall measures 7774m, consistent with apical hypertrophic cardiomyopathy 2.  No late gadolinium enhancement to suggest myocardial scar 3.  Normal LV size with hyperdynamic systolic function (EF 7479%4.  Normal RV size and systolic function (EF 6502%5. Small pericardial effusion measures up to 74m38mdjacent to LV inferior wall   Coronary CT 03/10/20 1. Coronary calcium score of 3. This was 31st percentile for age and sex matched control.   2. Normal coronary origin with right dominance.   3. Nonobstructive CAD with calcified plaque in the mid LAD and mid D1 causing minimal (0-24%) stenosis   Cardiac monitor 02/11/2020 personally reviewed 4 Ventricular Tachycardia runs occurred, the run with the fastest interval lasting 14.7 secs with a maximum rate of 152 bpm (average 113 bpm); the run with the fastest interval was also the longest.   36 Supraventricular Tachycardia runs occurred, the run with the fastest interval lasting 4 beats with a maximum rate of 158 bpm, the longest lasting 14.3 secs with an average rate of 109 bpm.  Recent Labs: No results found for requested labs within last 365 days.  Recent Lipid Panel No results found for: "CHOL", "TRIG", "HDL", "CHOLHDL", "VLDL", "LDLCALC", "LDLDIRECT"  History of Present Illness    [age]ar old female with the above past medical history including apical variant hypertrophic cardiomyopathy (noted on echo and confirmed on cardiac MR), paroxysmal atrial fibrillation diagnosed in 2015 on diltiazem and Eliquis, hypertension, hyperlipidemia, Mnire's disease, and BPPV.  She was evaluated by EP in 03/2020 and based on all  the information reviewed it was recommended she hold off on pursuing ICD.  Patient was in agreement with this.  She was hospitalized in 03/2022 in the setting of A-fib with RVR.  Echocardiogram was normal. Diltiazem was increased to 240 mg daily. She was last seen in the office on 03/24/2022 and was stable from a cardiac standpoint.    She presents today for follow-up and for preoperative cardiac evaluation for right total knee arthroplasty scheduled for 07/18/2022 with Dr. FraGaynelle Arabian [facility].  Since her last visit she has done well from a cardiac standpoint.  She has remained active despite significant knee pain.  She denies chest pain, dyspnea, palpitations, dizziness, presyncope, syncope, edema, PND, orthopnea, weight gain.  She is eager to proceed with her knee surgery.  Overall, she reports feeling well.  Home Medications    Current Outpatient Medications  Medication Sig Dispense Refill   B Complex-C-Folic Acid (HM SUPER VITAMIN B COMPLEX/C) TABS Take 1 tablet by mouth daily.     buPROPion (WELLBUTRIN XL) 150 MG 24 hr tablet Take 1 tablet by mouth daily.     Calcium Carbonate-Vit D-Min (CALCIUM 1200 PO) Take 1,200 mg by mouth daily.     diltiazem (CARDIZEM CD) 240 MG 24 hr capsule Take 1 capsule (240 mg total) by mouth daily. 90 capsule 3   ELIQUIS 5 MG TABS tablet Take 5 mg by mouth 2 (two) times  daily.  3   estradiol (ESTRACE) 0.1 MG/GM vaginal cream APPLY TO THE LABIA OR VAGINALLY 3 TIMES A WEEK AT BEDTIME FOR MENOPAUSAL CHANGES.     famotidine (PEPCID) 20 MG tablet Take 20 mg by mouth at bedtime.     FARXIGA 5 MG TABS tablet Take 5 mg by mouth daily.     ketoconazole (NIZORAL) 2 % cream ketoconazole 2 % topical cream  APPLY TO RASH IN SKIN FOLDS 2 TIMES DAILY X 14 DAYS AS NEEDED     metoprolol tartrate (LOPRESSOR) 25 MG tablet Take 1 tablet (25 mg total) by mouth every 8 (eight) hours as needed (If heartrate is greater that 130). 30 tablet 5   montelukast (SINGULAIR) 10 MG tablet  Take 10 mg by mouth 3 times/day as needed-between meals & bedtime.     Multiple Vitamins-Minerals (HEALTHY EYES) TABS Take 1 tablet by mouth daily.     Omega-3 Fatty Acids (FISH OIL) 1000 MG CAPS Take 1,000 mg by mouth daily.      rosuvastatin (CRESTOR) 5 MG tablet Take 5 mg by mouth every evening.  12   spironolactone-hydrochlorothiazide (ALDACTAZIDE) 25-25 MG tablet Take 0.5 tablets by mouth daily.      Current Facility-Administered Medications  Medication Dose Route Frequency Provider Last Rate Last Admin   triamcinolone acetonide (KENALOG-40) injection 20 mg  20 mg Other Once Landis Martins, DPM         Review of Systems    She denies chest pain, palpitations, dyspnea, pnd, orthopnea, n, v, dizziness, syncope, edema, weight gain, or early satiety. All other systems reviewed and are otherwise negative except as noted above.  Physical Exam    VS:  BP (!) 148/72   Pulse 68   Ht '5\' 6"'$  (1.676 m)   Wt 197 lb 6.4 oz (89.5 kg)   LMP  (LMP Unknown)   SpO2 96%   BMI 31.86 kg/m  GEN: Well nourished, well developed, in no acute distress. HEENT: normal. Neck: Supple, no JVD, carotid bruits, or masses. Cardiac: RRR, no murmurs, rubs, or gallops. No clubbing, cyanosis, edema.  Radials/DP/PT 2+ and equal bilaterally.  Respiratory:  Respirations regular and unlabored, clear to auscultation bilaterally. GI: Soft, nontender, nondistended, BS + x 4. MS: no deformity or atrophy. Skin: warm and dry, no rash. Neuro:  Strength and sensation are intact. Psych: Normal affect.  Accessory Clinical Findings    ECG personally reviewed by me today -NSR, 68 bpm, LVH- no acute changes.   No results found for: "WBC", "HGB", "HCT", "MCV", "PLT" Lab Results  Component Value Date   CREATININE 1.01 (H) 03/02/2020   BUN 23 03/02/2020   NA 144 03/02/2020   K 3.9 03/02/2020   CL 105 03/02/2020   CO2 25 03/02/2020   No results found for: "ALT", "AST", "GGT", "ALKPHOS", "BILITOT" No results found for:  "CHOL", "HDL", "LDLCALC", "LDLDIRECT", "TRIG", "CHOLHDL"  No results found for: "HGBA1C"  Assessment & Plan    1. Apical variant hypertrophic cardiomyopathy: Noted on prior echo and confirmed on cardiac MRI. Euvolemic and well compensated on exam, denies any dizziness, presyncope, syncope. Overall stable  2. Paroxysmal atrial fibrillation: Maintaining NSR.  Continue diltiazem, as needed metoprolol, and Eliquis.  3. Hypertension: BP mildly elevated in office today, likely in the setting of significant pain.  Continue to monitor and report BP consistently greater than 140/80.  Continue current antihypertensive regimen.  4. Hyperlipidemia: No recent LDL on file.  Consider repeat labs at next follow-up visit.  Continue  Crestor.  5. Preoperative cardiac exam: According to the Revised Cardiac Risk Index (RCRI), her Perioperative Risk of Major Cardiac Event is (%): 0.9. Her Functional Capacity in METs is: 6.27 according to the Duke Activity Status Index (DASI). Therefore, based on ACC/AHA guidelines, patient would be at acceptable risk for the planned procedure without further cardiovascular testing. Per office protocol, patient can hold Eliquis for 3 days prior to procedure.  Patient will not need bridging with Lovenox (enoxaparin) around procedure.I will route this recommendation to the requesting party via Epic fax function.  6. Disposition: Follow-up in 4-6 months with Dr. Harriet Masson.   HYPERTENSION CONTROL Vitals:   07/06/22 1434 07/06/22 1453  BP: (!) 180/82 (!) 148/72    The patient's blood pressure is elevated above target today.  In order to address the patient's elevated BP: Blood pressure will be monitored at home to determine if medication changes need to be made.     Lenna Sciara, NP 07/06/2022, 4:22 PM

## 2022-07-07 DIAGNOSIS — I480 Paroxysmal atrial fibrillation: Secondary | ICD-10-CM | POA: Diagnosis not present

## 2022-07-07 DIAGNOSIS — Z0000 Encounter for general adult medical examination without abnormal findings: Secondary | ICD-10-CM | POA: Diagnosis not present

## 2022-07-07 DIAGNOSIS — N1832 Chronic kidney disease, stage 3b: Secondary | ICD-10-CM | POA: Diagnosis not present

## 2022-07-07 DIAGNOSIS — Z79899 Other long term (current) drug therapy: Secondary | ICD-10-CM | POA: Diagnosis not present

## 2022-07-07 DIAGNOSIS — R7302 Impaired glucose tolerance (oral): Secondary | ICD-10-CM | POA: Diagnosis not present

## 2022-07-07 DIAGNOSIS — Z1331 Encounter for screening for depression: Secondary | ICD-10-CM | POA: Diagnosis not present

## 2022-07-07 DIAGNOSIS — I422 Other hypertrophic cardiomyopathy: Secondary | ICD-10-CM | POA: Diagnosis not present

## 2022-07-07 DIAGNOSIS — E785 Hyperlipidemia, unspecified: Secondary | ICD-10-CM | POA: Diagnosis not present

## 2022-07-07 NOTE — Patient Instructions (Signed)
DUE TO COVID-19 ONLY TWO VISITORS  (aged 78 and older)  ARE ALLOWED TO COME WITH YOU AND STAY IN THE WAITING ROOM ONLY DURING PRE OP AND PROCEDURE.   **NO VISITORS ARE ALLOWED IN THE SHORT STAY AREA OR RECOVERY ROOM!!**  IF YOU WILL BE ADMITTED INTO THE HOSPITAL YOU ARE ALLOWED ONLY FOUR SUPPORT PEOPLE DURING VISITATION HOURS ONLY (7 AM -8PM)   The support person(s) must pass our screening, gel in and out, and wear a mask at all times, including in the patient's room. Patients must also wear a mask when staff or their support person are in the room. Visitors GUEST BADGE MUST BE WORN VISIBLY  One adult visitor may remain with you overnight and MUST be in the room by 8 P.M.     Your procedure is scheduled on: 07/18/22   Report to [facility] Main Entrance    Report to admitting at  5:15 AM   Call this number if you have problems the morning of surgery [phone]   Do not eat food :After Midnight.   After Midnight you may have the following liquids until _4:15_____ AM/  DAY OF SURGERY  Water Black Coffee (sugar ok, NO MILK/CREAM OR CREAMERS)  Tea (sugar ok, NO MILK/CREAM OR CREAMERS) regular and decaf                             Plain Jell-O (NO RED)                                           Fruit ices (not with fruit pulp, NO RED)                                     Popsicles (NO RED)                                                                  Juice: apple, WHITE grape, WHITE cranberry Sports drinks like Gatorade (NO RED)                   The day of surgery:  Drink ONE G2 at 4:00 AM the morning of surgery. Drink in one sitting. Do not sip.  This drink was given to you during your hospital  pre-op appointment visit. Nothing else to drink after completing the  G2. At 4:15 AM          If you have questions, please contact your surgeon's office.      Oral Hygiene is also important to reduce your risk of infection.                                    Remember -  BRUSH YOUR TEETH THE MORNING OF SURGERY WITH YOUR REGULAR TOOTHPASTE  DENTURES WILL BE REMOVED PRIOR TO SURGERY PLEASE DO NOT APPLY "Poly grip" OR ADHESIVES!!!   Do NOT smoke after Midnight   Take these medicines the morning of  surgery with A SIP OF WATER: Bupropion-Wellbutrin                                                                                                                            Rosuvastatin                                                                                                                            Metoprolol                                                                                                                            Diltiazem                                                                                                                            Pepcid                                                     DO NOT TAKE ANY ORAL DIABETIC MEDICATIONS DAY OF YOUR SURGERY Farxiga hold 3 days prior to day of surgery  Hold Farxiga day of surgery  Bring CPAP mask and tubing day of surgery.                              You may not have any metal on your body including hair pins, jewelry, and body piercing             Do not wear make-up, lotions, powders, perfumes/cologne, or deodorant  Do not wear nail polish including gel and S&S, artificial/acrylic nails, or any other type of covering on natural nails including finger and toenails. If you have artificial nails, gel coating, etc. that needs to be removed by a nail salon please have this removed prior to surgery or surgery may need to be canceled/ delayed if the surgeon/ anesthesia feels like they are unable to be safely monitored.   Do not shave  48 hours prior to surgery.     Do not bring valuables to the hospital. [place].   Contacts, glasses, or bridgework may not be worn into surgery.   Bring small overnight bag day  of surgery.   DO NOT [place]. PHARMACY WILL DISPENSE MEDICATIONS LISTED ON YOUR MEDICATION LIST TO YOU DURING YOUR ADMISSION [place]!       Special Instructions: Bring a copy of your healthcare power of attorney and living will documents   the day of surgery if you haven't scanned them before.              Please read over the following fact sheets you were given: IF YOU HAVE QUESTIONS ABOUT YOUR PRE-OP INSTRUCTIONS PLEASE CALL [phone]    [facility] - Preparing for Surgery Before surgery, you can play an important role.  Because skin is not sterile, your skin needs to be as free of germs as possible.  You can reduce the number of germs on your skin by washing with CHG (chlorahexidine gluconate) soap before surgery.  CHG is an antiseptic cleaner which kills germs and bonds with the skin to continue killing germs even after washing. Please DO NOT use if you have an allergy to CHG or antibacterial soaps.  If your skin becomes reddened/irritated stop using the CHG and inform your nurse when you arrive at Short Stay. Do not shave (including legs and underarms) for at least 48 hours prior to the first CHG shower.   Please follow these instructions carefully:  1.  Shower with CHG Soap the night before surgery and the  morning of Surgery.  2.  If you choose to wash your hair, wash your hair first as usual with your  normal  shampoo.  3.  After you shampoo, rinse your hair and body thoroughly to remove the  shampoo.                            4.  Use CHG as you would any other liquid soap.  You can apply chg directly  to the skin and wash                       Gently with a scrungie or clean washcloth.  5.  Apply the CHG Soap to your body ONLY FROM THE NECK DOWN.   Do not use on  face/ open                           Wound or open sores. Avoid contact with eyes, ears mouth and genitals (private parts).                       Wash face,  Genitals (private  parts) with your normal soap.             6.  Wash thoroughly, paying special attention to the area where your surgery  will be performed.  7.  Thoroughly rinse your body with warm water from the neck down.  8.  DO NOT shower/wash with your normal soap after using and rinsing off  the CHG Soap.            9.  Pat yourself dry with a clean towel.            10.  Wear clean pajamas.            11.  Place clean sheets on your bed the night of your first shower and do not  sleep with pets. Day of Surgery : Do not apply any lotions/deodorants the morning of surgery.  Please wear clean clothes to the hospital/surgery center.  FAILURE TO FOLLOW THESE INSTRUCTIONS MAY RESULT IN THE CANCELLATION OF YOUR SURGERY    ________________________________________________________________________  Incentive Spirometer  An incentive spirometer is a tool that can help keep your lungs clear and active. This tool measures how well you are filling your lungs with each breath. Taking long deep breaths may help reverse or decrease the chance of developing breathing (pulmonary) problems (especially infection) following: A long period of time when you are unable to move or be active. BEFORE THE PROCEDURE  If the spirometer includes an indicator to show your best effort, your nurse or respiratory therapist will set it to a desired goal. If possible, sit up straight or lean slightly forward. Try not to slouch. Hold the incentive spirometer in an upright position. INSTRUCTIONS FOR USE  Sit on the edge of your bed if possible, or sit up as far as you can in bed or on a chair. Hold the incentive spirometer in an upright position. Breathe out normally. Place the mouthpiece in your mouth and seal your lips tightly around it. Breathe in slowly and as deeply as possible, raising the piston or the ball toward the top of the column. Hold your breath for 3-5 seconds or for as long as possible. Allow the piston or ball to fall  to the bottom of the column. Remove the mouthpiece from your mouth and breathe out normally. Rest for a few seconds and repeat Steps 1 through 7 at least 10 times every 1-2 hours when you are awake. Take your time and take a few normal breaths between deep breaths. The spirometer may include an indicator to show your best effort. Use the indicator as a goal to work toward during each repetition. After each set of 10 deep breaths, practice coughing to be sure your lungs are clear. If you have an incision (the cut made at the time of surgery), support your incision when coughing by placing a pillow or rolled up towels firmly against it. Once you are able to get out of bed, walk around indoors and cough well. You may stop using the incentive spirometer when instructed by your caregiver.  RISKS AND COMPLICATIONS Take your  time so you do not get dizzy or light-headed. If you are in pain, you may need to take or ask for pain medication before doing incentive spirometry. It is harder to take a deep breath if you are having pain. AFTER USE Rest and breathe slowly and easily. It can be helpful to keep track of a log of your progress. Your caregiver can provide you with a simple table to help with this. If you are using the spirometer at home, follow these instructions: [place] IF:  You are having difficultly using the spirometer. You have trouble using the spirometer as often as instructed. Your pain medication is not giving enough relief while using the spirometer. You develop fever of 100.5 F (38.1 C) or higher. SEEK IMMEDIATE MEDICAL CARE IF:  You cough up bloody sputum that had not been present before. You develop fever of 102 F (38.9 C) or greater. You develop worsening pain at or near the incision site. MAKE SURE YOU:  Understand these instructions. Will watch your condition. Will get help right away if you are not doing well or get worse. Document Released: 10/03/2006 Document  Revised: 08/15/2011 Document Reviewed: 12/04/2006 [facility] Patient Information 2014 [place], [place].   ________________________________________________________________________

## 2022-07-08 ENCOUNTER — Other Ambulatory Visit: Payer: Self-pay

## 2022-07-08 ENCOUNTER — Encounter (HOSPITAL_COMMUNITY): Payer: Self-pay

## 2022-07-08 ENCOUNTER — Encounter (HOSPITAL_COMMUNITY)
Admission: RE | Admit: 2022-07-08 | Discharge: 2022-07-08 | Disposition: A | Discharge status: Home / Self Care | Payer: Medicare Other | Source: Ambulatory Visit | Attending: Orthopedic Surgery | Admitting: Orthopedic Surgery

## 2022-07-08 DIAGNOSIS — I10 Essential (primary) hypertension: Secondary | ICD-10-CM | POA: Insufficient documentation

## 2022-07-08 DIAGNOSIS — Z01818 Encounter for other preprocedural examination: Secondary | ICD-10-CM

## 2022-07-08 DIAGNOSIS — Z01812 Encounter for preprocedural laboratory examination: Secondary | ICD-10-CM | POA: Insufficient documentation

## 2022-07-08 DIAGNOSIS — I422 Other hypertrophic cardiomyopathy: Secondary | ICD-10-CM | POA: Diagnosis not present

## 2022-07-08 DIAGNOSIS — M1711 Unilateral primary osteoarthritis, right knee: Secondary | ICD-10-CM | POA: Diagnosis not present

## 2022-07-08 DIAGNOSIS — I4891 Unspecified atrial fibrillation: Secondary | ICD-10-CM | POA: Diagnosis not present

## 2022-07-08 DIAGNOSIS — H8110 Benign paroxysmal vertigo, unspecified ear: Secondary | ICD-10-CM | POA: Diagnosis not present

## 2022-07-08 DIAGNOSIS — I071 Rheumatic tricuspid insufficiency: Secondary | ICD-10-CM | POA: Insufficient documentation

## 2022-07-08 DIAGNOSIS — H8109 Meniere's disease, unspecified ear: Secondary | ICD-10-CM | POA: Insufficient documentation

## 2022-07-08 DIAGNOSIS — M25561 Pain in right knee: Secondary | ICD-10-CM | POA: Diagnosis not present

## 2022-07-08 HISTORY — DX: Other hypertrophic cardiomyopathy: I42.2

## 2022-07-08 LAB — BASIC METABOLIC PANEL
Anion gap: 6 (ref 5–15)
BUN: 25 mg/dL — ABNORMAL HIGH (ref 8–23)
CO2: 28 mmol/L (ref 22–32)
Calcium: 9.6 mg/dL (ref 8.9–10.3)
Chloride: 106 mmol/L (ref 98–111)
Creatinine, Ser: 1.04 mg/dL — ABNORMAL HIGH (ref 0.44–1.00)
GFR, Estimated: 55 mL/min — ABNORMAL LOW (ref >60)
Glucose, Bld: 103 mg/dL — ABNORMAL HIGH (ref 70–99)
Potassium: 4.1 mmol/L (ref 3.5–5.1)
Sodium: 140 mmol/L (ref 135–145)

## 2022-07-08 LAB — CBC
HCT: 46.1 % — ABNORMAL HIGH (ref 36.0–46.0)
Hemoglobin: 15.2 g/dL — ABNORMAL HIGH (ref 12.0–15.0)
MCH: 30.9 pg (ref 26.0–34.0)
MCHC: 33 g/dL (ref 30.0–36.0)
MCV: 93.7 fL (ref 80.0–100.0)
Platelets: 204 10*3/uL (ref 150–400)
RBC: 4.92 MIL/uL (ref 3.87–5.11)
RDW: 14.2 % (ref 11.5–15.5)
WBC: 5.4 10*3/uL (ref 4.0–10.5)
nRBC: 0 % (ref 0.0–0.2)

## 2022-07-08 LAB — SURGICAL PCR SCREEN
MRSA, PCR: NEGATIVE
Staphylococcus aureus: NEGATIVE

## 2022-07-08 NOTE — Progress Notes (Signed)
Anesthesia note:  Bowel prep reminder:  NA  PCP - Dr. Margret Chance Cardiologist -Dr. Britt Boozer lov2/1/24 Other-   Chest x-ray -  EKG - 03/24/22-epic Stress Test -  ECHO - 03/21/22-epic Cardiac Cath - no CABG-no Pacemaker/ICD device last checked:NA  Sleep Study - no CPAP -   Pt is pre diabetic-no CBG at PAT visit- Fasting Blood Sugar at home- Checks Blood Sugar _____  Blood Thinner:Eliquis for S fib hold 72 hours , 3 days prior to day of surgery Blood Thinner Instructions: Aspirin Instructions: Last Dose:2/8  Anesthesia review: Yes  reason:Cardiac  Patient denies shortness of breath, fever, cough and chest pain at PAT appointment. Pt has no SOB with activities.    Patient verbalized understanding of instructions that were given to them at the PAT appointment. Patient was also instructed that they will need to review over the PAT instructions again at home before surgery.yes

## 2022-07-11 NOTE — Anesthesia Preprocedure Evaluation (Addendum)
Anesthesia Evaluation  Patient identified by MRN, date of birth, ID band Patient awake    Reviewed: Allergy & Precautions, NPO status , Patient's Chart, lab work & pertinent test results, reviewed documented beta blocker date and time   History of Anesthesia Complications Negative for: history of anesthetic complications  Airway Mallampati: III  TM Distance: >3 FB Neck ROM: Full    Dental  (+) Dental Advisory Given   Pulmonary neg pulmonary ROS   Pulmonary exam normal breath sounds clear to auscultation       Cardiovascular hypertension (diltiazem, metoprolol, spironolactone-HCTZ), Pt. on medications and Pt. on home beta blockers (-) angina (-) Past MI and (-) Cardiac Stents + dysrhythmias Atrial Fibrillation  Rhythm:Regular Rate:Normal  HLD  TTE 03/21/2022: There is severe concentric left ventricular hypertrophy with septal and  posterior wall thickness ~ 1.5 cm.  Apical hypertrophy appears more predominant on contrast study.  No evidence of LVOT obstruction.  LVEF 65-70%  Mild tricuspid regurgitation   CT Coronary 03/09/2020: IMPRESSION: 1. Coronary calcium score of 3. This was 31st percentile for age and sex matched control.   2. Normal coronary origin with right dominance.   3. Nonobstructive CAD with calcified plaque in the mid LAD and mid D1 causing minimal (0-24%) stenosis     Neuro/Psych neg Seizures Meniere's disease    GI/Hepatic Neg liver ROS,GERD  Medicated,,  Endo/Other  negative endocrine ROS    Renal/GU negative Renal ROS     Musculoskeletal  (+) Arthritis , Osteoarthritis,    Abdominal   Peds  Hematology  (+) Blood dyscrasia, anemia   Anesthesia Other Findings 77 year old female with a history of apical variant hypertrophic cardiomyopathy (noted on echo and confirmed on cardiac MR), paroxysmal atrial fibrillation diagnosed in 2015 on diltiazem and Eliquis, hypertension, hyperlipidemia,  Mnire's disease, and BPPV  Reproductive/Obstetrics                             Anesthesia Physical Anesthesia Plan  ASA: 3  Anesthesia Plan: General and Regional   Post-op Pain Management: Regional block* and Tylenol PO (pre-op)*   Induction:   PONV Risk Score and Plan: 3 and Ondansetron, Dexamethasone, Treatment may vary due to age or medical condition, Propofol infusion and TIVA  Airway Management Planned: LMA  Additional Equipment: ClearSight  Intra-op Plan:   Post-operative Plan: Extubation in OR  Informed Consent: I have reviewed the patients History and Physical, chart, labs and discussed the procedure including the risks, benefits and alternatives for the proposed anesthesia with the patient or authorized representative who has indicated his/her understanding and acceptance.     Dental advisory given  Plan Discussed with: CRNA and Anesthesiologist  Anesthesia Plan Comments: (Discussed potential risks of nerve blocks including, but not limited to, infection, bleeding, nerve damage, seizures, pneumothorax, respiratory depression, and potential failure of the block. Alternatives to nerve blocks discussed. All questions answered.  Risks of general anesthesia discussed including, but not limited to, sore throat, hoarse voice, chipped/damaged teeth, injury to vocal cords, nausea and vomiting, allergic reactions, lung infection, heart attack, stroke, and death. All questions answered.   See PAT note 07/08/22)       Anesthesia Quick Evaluation

## 2022-07-11 NOTE — Progress Notes (Signed)
Anesthesia Chart Review   Case: [id number] Date/Time: 07/18/22 0700   Procedure: TOTAL KNEE ARTHROPLASTY (Right: Knee)   Anesthesia type: Choice   Pre-op diagnosis: right knee osteoarthritis   Location: [facility] / [facility]   Surgeons: Gaynelle Arabian, MD       DISCUSSION:76 y.o. never smoker with h/o HTN, atrial fibrillation, hypertrophic cardiomyopathy, Mnire's disease, and BPPV, right knee OA scheduled for above procedure 07/18/2022 with Dr. Gaynelle Arabian.   Pt last seen by cardiology 07/06/2022. Per OV note, "Preoperative cardiac exam: According to the Revised Cardiac Risk Index (RCRI), her Perioperative Risk of Major Cardiac Event is (%): 0.9. Her Functional Capacity in METs is: 6.27 according to the Duke Activity Status Index (DASI). Therefore, based on ACC/AHA guidelines, patient would be at acceptable risk for the planned procedure without further cardiovascular testing. Per office protocol, patient can hold Eliquis for 3 days prior to procedure.  Patient will not need bridging with Lovenox (enoxaparin) around procedure.I will route this recommendation to the requesting party via Epic fax function."  Pt reports last dose of Eliquis 07/14/2022.   Anticipate pt can proceed with planned procedure barring acute status change.   VS: BP (!) 179/74   Pulse 66   Temp 36.8 C (Oral)   Resp 18   Ht '5\' 7"'$  (1.702 m)   Wt 88 kg   LMP  (LMP Unknown)   SpO2 98%   BMI 30.38 kg/m   PROVIDERS: Serita Grammes, MD is PCP   Primary Cardiologist:  Berniece Salines, DO  LABS: Labs reviewed: Acceptable for surgery. (all labs ordered are listed, but only abnormal results are displayed)  Labs Reviewed  BASIC METABOLIC PANEL - Abnormal; Notable for the following components:      Result Value   Glucose, Bld 103 (*)    BUN 25 (*)    Creatinine, Ser 1.04 (*)    GFR, Estimated 55 (*)    All other components within normal limits  CBC - Abnormal; Notable for the following components:   Hemoglobin 15.2  (*)    HCT 46.1 (*)    All other components within normal limits  SURGICAL PCR SCREEN     IMAGES:   EKG:   CV: Echo 10/18/2019  1. Left ventricular ejection fraction, by estimation, is 60 to 65%. The  left ventricle has normal function. The left ventricle has no regional  wall motion abnormalities. There is moderate concentric left ventricular  hypertrophy. There is concern for  apical variant of hypertrophic cardiomyopathy, with post definity images  also suggestive of Apical variant of HCM. Further testing with cardiac MRI  is recommended.   2. Left ventricular diastolic parameters are consistent with Grade I  diastolic dysfunction (impaired relaxation).   3. Right ventricular systolic function is normal. The right ventricular  size is normal. There is normal pulmonary artery systolic pressure.   4. The mitral valve is normal in structure. No evidence of mitral valve  regurgitation. No evidence of mitral stenosis.   5. Tricuspid valve regurgitation is moderate.   6. The aortic valve is normal in structure. Aortic valve regurgitation is  not visualized. No aortic stenosis is present.   7. The inferior vena cava is normal in size with greater than 50%  respiratory variability, suggesting right atrial pressure of 3 mmHg.  Past Medical History:  Diagnosis Date   Anemia    Arthritis    Asymmetrical left sensorineural hearing loss 10/01/2019   Atrial fibrillation ([place]) 10/02/2019  Essential hypertension 10/03/2019   History of colon polyps    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, left ([place]) 01/17/2017   Meniere's disease 08/29/2019   with hearing loss in Lt ear   Mixed hyperlipidemia 10/03/2019   Multiple dysplastic nevi    Other hypertrophic cardiomyopathy ([place])    Pleural effusion     Past Surgical History:  Procedure Laterality Date   [place]   CATARACT EXTRACTION, BILATERAL  2019   COLONOSCOPY  11/07/2016    Colonic polyp status post polypectomy. Minimal sigmoid diverticulosis   DILATION AND CURETTAGE OF UTERUS  1968   MOHS SURGERY Left 2019   Melanoma on left ear   TUBAL LIGATION  1977    MEDICATIONS:  buPROPion (WELLBUTRIN XL) 150 MG 24 hr tablet   Calcium Carb-Cholecalciferol (CALCIUM 600+D3 PO)   diltiazem (CARDIZEM CD) 240 MG 24 hr capsule   ELIQUIS 5 MG TABS tablet   estradiol (ESTRACE) 0.1 MG/GM vaginal cream   famotidine (PEPCID) 20 MG tablet   FARXIGA 5 MG TABS tablet   ketoconazole (NIZORAL) 2 % cream   metoprolol tartrate (LOPRESSOR) 25 MG tablet   montelukast (SINGULAIR) 10 MG tablet   Multiple Vitamins-Minerals (HAIR SKIN NAILS PO)   Multiple Vitamins-Minerals (PRESERVISION AREDS 2 PO)   Omega-3 Fatty Acids (OMEGA-3 FISH OIL) 1200 MG CAPS   rosuvastatin (CRESTOR) 5 MG tablet   spironolactone-hydrochlorothiazide (ALDACTAZIDE) 25-25 MG tablet    triamcinolone acetonide (KENALOG-40) injection 20 mg    [facility], PA-C [facility] Pre-Surgical Testing [phone]

## 2022-07-18 ENCOUNTER — Ambulatory Visit (HOSPITAL_COMMUNITY)
Admission: RE | Admit: 2022-07-18 | Discharge: 2022-07-19 | Disposition: A | Discharge status: Home / Self Care | Payer: Medicare Other | Source: Ambulatory Visit | Attending: Orthopedic Surgery | Admitting: Orthopedic Surgery

## 2022-07-18 ENCOUNTER — Ambulatory Visit (HOSPITAL_COMMUNITY): Payer: Medicare Other | Admitting: Physician Assistant

## 2022-07-18 ENCOUNTER — Ambulatory Visit (HOSPITAL_BASED_OUTPATIENT_CLINIC_OR_DEPARTMENT_OTHER): Payer: Medicare Other | Admitting: Anesthesiology

## 2022-07-18 ENCOUNTER — Other Ambulatory Visit: Payer: Self-pay

## 2022-07-18 ENCOUNTER — Encounter (HOSPITAL_COMMUNITY): Payer: Self-pay | Admitting: Orthopedic Surgery

## 2022-07-18 ENCOUNTER — Encounter (HOSPITAL_COMMUNITY)
Admission: RE | Disposition: A | Discharge status: Home / Self Care | Payer: Self-pay | Source: Ambulatory Visit | Attending: Orthopedic Surgery

## 2022-07-18 DIAGNOSIS — I071 Rheumatic tricuspid insufficiency: Secondary | ICD-10-CM | POA: Insufficient documentation

## 2022-07-18 DIAGNOSIS — I2510 Atherosclerotic heart disease of native coronary artery without angina pectoris: Secondary | ICD-10-CM | POA: Diagnosis not present

## 2022-07-18 DIAGNOSIS — M1711 Unilateral primary osteoarthritis, right knee: Secondary | ICD-10-CM | POA: Diagnosis not present

## 2022-07-18 DIAGNOSIS — G8918 Other acute postprocedural pain: Secondary | ICD-10-CM | POA: Diagnosis not present

## 2022-07-18 DIAGNOSIS — I422 Other hypertrophic cardiomyopathy: Secondary | ICD-10-CM | POA: Insufficient documentation

## 2022-07-18 DIAGNOSIS — H8109 Meniere's disease, unspecified ear: Secondary | ICD-10-CM | POA: Diagnosis not present

## 2022-07-18 DIAGNOSIS — D649 Anemia, unspecified: Secondary | ICD-10-CM | POA: Diagnosis not present

## 2022-07-18 DIAGNOSIS — I4891 Unspecified atrial fibrillation: Secondary | ICD-10-CM | POA: Diagnosis not present

## 2022-07-18 DIAGNOSIS — M179 Osteoarthritis of knee, unspecified: Secondary | ICD-10-CM | POA: Diagnosis present

## 2022-07-18 DIAGNOSIS — Z7901 Long term (current) use of anticoagulants: Secondary | ICD-10-CM | POA: Insufficient documentation

## 2022-07-18 DIAGNOSIS — I10 Essential (primary) hypertension: Secondary | ICD-10-CM | POA: Insufficient documentation

## 2022-07-18 DIAGNOSIS — K219 Gastro-esophageal reflux disease without esophagitis: Secondary | ICD-10-CM | POA: Insufficient documentation

## 2022-07-18 DIAGNOSIS — I480 Paroxysmal atrial fibrillation: Secondary | ICD-10-CM | POA: Diagnosis not present

## 2022-07-18 DIAGNOSIS — E785 Hyperlipidemia, unspecified: Secondary | ICD-10-CM | POA: Diagnosis not present

## 2022-07-18 DIAGNOSIS — I517 Cardiomegaly: Secondary | ICD-10-CM | POA: Insufficient documentation

## 2022-07-18 DIAGNOSIS — Z01818 Encounter for other preprocedural examination: Secondary | ICD-10-CM

## 2022-07-18 HISTORY — PX: TOTAL KNEE ARTHROPLASTY: SHX125

## 2022-07-18 SURGERY — ARTHROPLASTY, KNEE, TOTAL
Anesthesia: Regional | Site: Knee | Laterality: Right

## 2022-07-18 MED ORDER — HYDROMORPHONE HCL 1 MG/ML IJ SOLN: 1.0000 mg | INTRAMUSCULAR | Status: DC | PRN

## 2022-07-18 MED ORDER — PROPOFOL 500 MG/50ML IV EMUL
INTRAVENOUS | Status: DC | PRN
  Administered 2022-07-18: 100 mg via INTRAVENOUS

## 2022-07-18 MED ORDER — SODIUM CHLORIDE (PF) 0.9 % IJ SOLN
INTRAMUSCULAR | Status: AC
  Filled 2022-07-18: qty 20

## 2022-07-18 MED ORDER — METHOCARBAMOL 500 MG IVPB - SIMPLE MED
500.0000 mg | Freq: Four times a day (QID) | INTRAVENOUS | Status: DC | PRN
  Administered 2022-07-18: 500 mg via INTRAVENOUS

## 2022-07-18 MED ORDER — ACETAMINOPHEN 500 MG PO TABS
1000.0000 mg | ORAL_TABLET | Freq: Four times a day (QID) | ORAL | Status: AC
  Administered 2022-07-18 – 2022-07-19 (×4): 1000 mg via ORAL
  Filled 2022-07-18 (×4): qty 2

## 2022-07-18 MED ORDER — DEXAMETHASONE SODIUM PHOSPHATE 10 MG/ML IJ SOLN: 10.0000 mg | Freq: Once | INTRAMUSCULAR | Status: DC

## 2022-07-18 MED ORDER — ONDANSETRON HCL 4 MG/2ML IJ SOLN
INTRAMUSCULAR | Status: AC
  Filled 2022-07-18: qty 2

## 2022-07-18 MED ORDER — LACTATED RINGERS IV SOLN: INTRAVENOUS | Status: DC

## 2022-07-18 MED ORDER — DEXMEDETOMIDINE HCL IN NACL 80 MCG/20ML IV SOLN
INTRAVENOUS | Status: DC | PRN
  Administered 2022-07-18 (×2): 4 ug via BUCCAL

## 2022-07-18 MED ORDER — DIPHENHYDRAMINE HCL 12.5 MG/5ML PO ELIX: 12.5000 mg | ORAL_SOLUTION | ORAL | Status: DC | PRN

## 2022-07-18 MED ORDER — FENTANYL CITRATE (PF) 100 MCG/2ML IJ SOLN
INTRAMUSCULAR | Status: DC | PRN
  Administered 2022-07-18: 75 ug via INTRAVENOUS
  Administered 2022-07-18 (×2): 25 ug via INTRAVENOUS

## 2022-07-18 MED ORDER — DEXAMETHASONE SODIUM PHOSPHATE 10 MG/ML IJ SOLN
INTRAMUSCULAR | Status: DC | PRN
  Administered 2022-07-18: 10 mg via INTRAVENOUS

## 2022-07-18 MED ORDER — FENTANYL CITRATE (PF) 100 MCG/2ML IJ SOLN
INTRAMUSCULAR | Status: AC
  Filled 2022-07-18: qty 2

## 2022-07-18 MED ORDER — FLEET ENEMA 7-19 GM/118ML RE ENEM: 1.0000 | ENEMA | Freq: Once | RECTAL | Status: DC | PRN

## 2022-07-18 MED ORDER — DILTIAZEM HCL ER COATED BEADS 240 MG PO CP24
240.0000 mg | ORAL_CAPSULE | Freq: Every day | ORAL | Status: DC
  Administered 2022-07-19: 240 mg via ORAL
  Filled 2022-07-18: qty 1

## 2022-07-18 MED ORDER — PROPOFOL 1000 MG/100ML IV EMUL
INTRAVENOUS | Status: AC
  Filled 2022-07-18: qty 100

## 2022-07-18 MED ORDER — METOPROLOL TARTRATE 25 MG PO TABS: 25.0000 mg | ORAL_TABLET | Freq: Three times a day (TID) | ORAL | Status: DC | PRN

## 2022-07-18 MED ORDER — PHENYLEPHRINE HCL (PRESSORS) 10 MG/ML IV SOLN
INTRAVENOUS | Status: AC
  Filled 2022-07-18: qty 1

## 2022-07-18 MED ORDER — DOCUSATE SODIUM 100 MG PO CAPS
100.0000 mg | ORAL_CAPSULE | Freq: Two times a day (BID) | ORAL | Status: DC
  Administered 2022-07-18 – 2022-07-19 (×2): 100 mg via ORAL
  Filled 2022-07-18 (×2): qty 1

## 2022-07-18 MED ORDER — ONDANSETRON HCL 4 MG/2ML IJ SOLN
INTRAMUSCULAR | Status: AC
  Filled 2022-07-18: qty 4

## 2022-07-18 MED ORDER — DEXAMETHASONE SODIUM PHOSPHATE 10 MG/ML IJ SOLN
INTRAMUSCULAR | Status: AC
  Filled 2022-07-18: qty 1

## 2022-07-18 MED ORDER — PHENYLEPHRINE 80 MCG/ML (10ML) SYRINGE FOR IV PUSH (FOR BLOOD PRESSURE SUPPORT)
PREFILLED_SYRINGE | INTRAVENOUS | Status: AC
  Filled 2022-07-18: qty 20

## 2022-07-18 MED ORDER — PHENYLEPHRINE 80 MCG/ML (10ML) SYRINGE FOR IV PUSH (FOR BLOOD PRESSURE SUPPORT)
PREFILLED_SYRINGE | INTRAVENOUS | Status: AC
  Filled 2022-07-18: qty 10

## 2022-07-18 MED ORDER — METOCLOPRAMIDE HCL 5 MG PO TABS: 5.0000 mg | ORAL_TABLET | Freq: Three times a day (TID) | ORAL | Status: DC | PRN

## 2022-07-18 MED ORDER — CHLORHEXIDINE GLUCONATE 0.12 % MT SOLN
15.0000 mL | Freq: Once | OROMUCOSAL | Status: AC
  Administered 2022-07-18: 15 mL via OROMUCOSAL

## 2022-07-18 MED ORDER — OXYCODONE HCL 5 MG/5ML PO SOLN: 5.0000 mg | Freq: Once | ORAL | Status: DC | PRN

## 2022-07-18 MED ORDER — ROSUVASTATIN CALCIUM 5 MG PO TABS
5.0000 mg | ORAL_TABLET | Freq: Every evening | ORAL | Status: DC
  Administered 2022-07-18: 5 mg via ORAL
  Filled 2022-07-18: qty 1

## 2022-07-18 MED ORDER — PHENYLEPHRINE 80 MCG/ML (10ML) SYRINGE FOR IV PUSH (FOR BLOOD PRESSURE SUPPORT)
PREFILLED_SYRINGE | INTRAVENOUS | Status: DC | PRN
  Administered 2022-07-18: 160 ug via INTRAVENOUS

## 2022-07-18 MED ORDER — BUPROPION HCL ER (XL) 150 MG PO TB24
150.0000 mg | ORAL_TABLET | Freq: Every day | ORAL | Status: DC
  Administered 2022-07-19: 150 mg via ORAL
  Filled 2022-07-18: qty 1

## 2022-07-18 MED ORDER — TRANEXAMIC ACID-NACL 1000-0.7 MG/100ML-% IV SOLN
1000.0000 mg | INTRAVENOUS | Status: AC
  Administered 2022-07-18: 1000 mg via INTRAVENOUS
  Filled 2022-07-18: qty 100

## 2022-07-18 MED ORDER — PHENOL 1.4 % MT LIQD: 1.0000 | OROMUCOSAL | Status: DC | PRN

## 2022-07-18 MED ORDER — DEXMEDETOMIDINE HCL IN NACL 80 MCG/20ML IV SOLN
INTRAVENOUS | Status: AC
  Filled 2022-07-18: qty 20

## 2022-07-18 MED ORDER — METHOCARBAMOL 500 MG PO TABS: 500.0000 mg | ORAL_TABLET | Freq: Four times a day (QID) | ORAL | Status: DC | PRN

## 2022-07-18 MED ORDER — PROPOFOL 10 MG/ML IV BOLUS
INTRAVENOUS | Status: AC
  Filled 2022-07-18: qty 20

## 2022-07-18 MED ORDER — TRAMADOL HCL 50 MG PO TABS
50.0000 mg | ORAL_TABLET | Freq: Four times a day (QID) | ORAL | Status: DC | PRN
  Filled 2022-07-18: qty 2

## 2022-07-18 MED ORDER — CEFAZOLIN SODIUM-DEXTROSE 2-4 GM/100ML-% IV SOLN
2.0000 g | INTRAVENOUS | Status: AC
  Administered 2022-07-18: 2 g via INTRAVENOUS
  Filled 2022-07-18: qty 100

## 2022-07-18 MED ORDER — POLYETHYLENE GLYCOL 3350 17 G PO PACK: 17.0000 g | PACK | Freq: Every day | ORAL | Status: DC | PRN

## 2022-07-18 MED ORDER — SODIUM CHLORIDE 0.9 % IR SOLN
Status: DC | PRN
  Administered 2022-07-18 (×2): 1000 mL

## 2022-07-18 MED ORDER — FENTANYL CITRATE PF 50 MCG/ML IJ SOSY
PREFILLED_SYRINGE | INTRAMUSCULAR | Status: AC
  Filled 2022-07-18: qty 2

## 2022-07-18 MED ORDER — METOCLOPRAMIDE HCL 5 MG/ML IJ SOLN: 5.0000 mg | Freq: Three times a day (TID) | INTRAMUSCULAR | Status: DC | PRN

## 2022-07-18 MED ORDER — ONDANSETRON HCL 4 MG PO TABS: 4.0000 mg | ORAL_TABLET | Freq: Four times a day (QID) | ORAL | Status: DC | PRN

## 2022-07-18 MED ORDER — FAMOTIDINE 20 MG PO TABS
20.0000 mg | ORAL_TABLET | Freq: Every day | ORAL | Status: DC
  Administered 2022-07-18: 20 mg via ORAL
  Filled 2022-07-18: qty 1

## 2022-07-18 MED ORDER — ORAL CARE MOUTH RINSE: 15.0000 mL | Freq: Once | OROMUCOSAL | Status: AC

## 2022-07-18 MED ORDER — ACETAMINOPHEN 325 MG PO TABS: 325.0000 mg | ORAL_TABLET | Freq: Four times a day (QID) | ORAL | Status: DC | PRN

## 2022-07-18 MED ORDER — OXYCODONE HCL 5 MG PO TABS: 5.0000 mg | ORAL_TABLET | Freq: Once | ORAL | Status: DC | PRN

## 2022-07-18 MED ORDER — ONDANSETRON HCL 4 MG/2ML IJ SOLN
INTRAMUSCULAR | Status: DC | PRN
  Administered 2022-07-18: 4 mg via INTRAVENOUS

## 2022-07-18 MED ORDER — ACETAMINOPHEN 10 MG/ML IV SOLN
1000.0000 mg | Freq: Four times a day (QID) | INTRAVENOUS | Status: DC
  Administered 2022-07-18: 1000 mg via INTRAVENOUS
  Filled 2022-07-18: qty 100

## 2022-07-18 MED ORDER — DEXAMETHASONE SODIUM PHOSPHATE 10 MG/ML IJ SOLN
INTRAMUSCULAR | Status: AC
  Filled 2022-07-18: qty 2

## 2022-07-18 MED ORDER — SODIUM CHLORIDE (PF) 0.9 % IJ SOLN
INTRAMUSCULAR | Status: DC | PRN
  Administered 2022-07-18: 60 mL

## 2022-07-18 MED ORDER — BUPIVACAINE LIPOSOME 1.3 % IJ SUSP: 20.0000 mL | Freq: Once | INTRAMUSCULAR | Status: DC

## 2022-07-18 MED ORDER — EPHEDRINE SULFATE-NACL 50-0.9 MG/10ML-% IV SOSY
PREFILLED_SYRINGE | INTRAVENOUS | Status: DC | PRN
  Administered 2022-07-18 (×3): 5 mg via INTRAVENOUS

## 2022-07-18 MED ORDER — CEFAZOLIN SODIUM-DEXTROSE 2-4 GM/100ML-% IV SOLN
2.0000 g | Freq: Four times a day (QID) | INTRAVENOUS | Status: AC
  Administered 2022-07-18 (×2): 2 g via INTRAVENOUS
  Filled 2022-07-18 (×2): qty 100

## 2022-07-18 MED ORDER — AMISULPRIDE (ANTIEMETIC) 5 MG/2ML IV SOLN: 10.0000 mg | Freq: Once | INTRAVENOUS | Status: DC | PRN

## 2022-07-18 MED ORDER — SODIUM CHLORIDE 0.9 % IV SOLN: INTRAVENOUS | Status: DC

## 2022-07-18 MED ORDER — SPIRONOLACTONE 12.5 MG HALF TABLET
12.5000 mg | ORAL_TABLET | Freq: Every day | ORAL | Status: DC
  Administered 2022-07-19: 12.5 mg via ORAL
  Filled 2022-07-18: qty 1

## 2022-07-18 MED ORDER — ONDANSETRON HCL 4 MG/2ML IJ SOLN: 4.0000 mg | Freq: Four times a day (QID) | INTRAMUSCULAR | Status: DC | PRN

## 2022-07-18 MED ORDER — LIDOCAINE 2% (20 MG/ML) 5 ML SYRINGE
INTRAMUSCULAR | Status: DC | PRN
  Administered 2022-07-18: 80 mg via INTRAVENOUS

## 2022-07-18 MED ORDER — FENTANYL CITRATE PF 50 MCG/ML IJ SOSY
25.0000 ug | PREFILLED_SYRINGE | INTRAMUSCULAR | Status: DC | PRN
  Administered 2022-07-18 (×2): 50 ug via INTRAVENOUS

## 2022-07-18 MED ORDER — LIDOCAINE HCL (PF) 2 % IJ SOLN
INTRAMUSCULAR | Status: AC
  Filled 2022-07-18: qty 5

## 2022-07-18 MED ORDER — EPHEDRINE 5 MG/ML INJ
INTRAVENOUS | Status: AC
  Filled 2022-07-18: qty 5

## 2022-07-18 MED ORDER — SODIUM CHLORIDE (PF) 0.9 % IJ SOLN
INTRAMUSCULAR | Status: AC
  Filled 2022-07-18: qty 50

## 2022-07-18 MED ORDER — MENTHOL 3 MG MT LOZG: 1.0000 | LOZENGE | OROMUCOSAL | Status: DC | PRN

## 2022-07-18 MED ORDER — APIXABAN 2.5 MG PO TABS
2.5000 mg | ORAL_TABLET | Freq: Two times a day (BID) | ORAL | Status: DC
  Administered 2022-07-19: 2.5 mg via ORAL
  Filled 2022-07-18: qty 1

## 2022-07-18 MED ORDER — POVIDONE-IODINE 10 % EX SWAB
2.0000 | Freq: Once | CUTANEOUS | Status: AC
  Administered 2022-07-18: 2 via TOPICAL

## 2022-07-18 MED ORDER — BUPIVACAINE LIPOSOME 1.3 % IJ SUSP
INTRAMUSCULAR | Status: AC
  Filled 2022-07-18: qty 20

## 2022-07-18 MED ORDER — ACETAMINOPHEN 500 MG PO TABS: 1000.0000 mg | ORAL_TABLET | Freq: Once | ORAL | Status: DC

## 2022-07-18 MED ORDER — METHOCARBAMOL 500 MG IVPB - SIMPLE MED
INTRAVENOUS | Status: AC
  Filled 2022-07-18: qty 55

## 2022-07-18 MED ORDER — HYDROCHLOROTHIAZIDE 12.5 MG PO TABS
12.5000 mg | ORAL_TABLET | Freq: Every day | ORAL | Status: DC
  Administered 2022-07-19: 12.5 mg via ORAL
  Filled 2022-07-18: qty 1

## 2022-07-18 MED ORDER — DEXAMETHASONE SODIUM PHOSPHATE 10 MG/ML IJ SOLN: 8.0000 mg | Freq: Once | INTRAMUSCULAR | Status: DC

## 2022-07-18 MED ORDER — ROPIVACAINE HCL 5 MG/ML IJ SOLN
INTRAMUSCULAR | Status: DC | PRN
  Administered 2022-07-18: 20 mL via PERINEURAL

## 2022-07-18 MED ORDER — EPHEDRINE 5 MG/ML INJ
INTRAVENOUS | Status: AC
  Filled 2022-07-18: qty 10

## 2022-07-18 MED ORDER — MONTELUKAST SODIUM 10 MG PO TABS
10.0000 mg | ORAL_TABLET | Freq: Every day | ORAL | Status: DC
  Administered 2022-07-18: 10 mg via ORAL
  Filled 2022-07-18: qty 1

## 2022-07-18 MED ORDER — HYDROMORPHONE HCL 1 MG/ML IJ SOLN: 0.5000 mg | INTRAMUSCULAR | Status: DC | PRN

## 2022-07-18 MED ORDER — SPIRONOLACTONE-HCTZ 25-25 MG PO TABS: 0.5000 | ORAL_TABLET | Freq: Every morning | ORAL | Status: DC

## 2022-07-18 MED ORDER — BISACODYL 10 MG RE SUPP: 10.0000 mg | Freq: Every day | RECTAL | Status: DC | PRN

## 2022-07-18 MED ORDER — OXYCODONE HCL 5 MG PO TABS
5.0000 mg | ORAL_TABLET | ORAL | Status: DC | PRN
  Administered 2022-07-18 – 2022-07-19 (×4): 5 mg via ORAL
  Filled 2022-07-18 (×5): qty 1

## 2022-07-18 MED ORDER — BUPIVACAINE LIPOSOME 1.3 % IJ SUSP
INTRAMUSCULAR | Status: DC | PRN
  Administered 2022-07-18: 20 mL

## 2022-07-18 MED ORDER — DAPAGLIFLOZIN PROPANEDIOL 5 MG PO TABS
5.0000 mg | ORAL_TABLET | Freq: Every day | ORAL | Status: DC
  Administered 2022-07-19: 5 mg via ORAL
  Filled 2022-07-18: qty 1

## 2022-07-18 SURGICAL SUPPLY — 54 items
ATTUNE MED DOME PAT 38 KNEE (Knees) IMPLANT
ATTUNE PS FEM RT SZ 6 CEM KNEE (Femur) IMPLANT
ATTUNE PSRP INSR SZ6 6 KNEE (Insert) IMPLANT
BAG COUNTER SPONGE SURGICOUNT (BAG) IMPLANT
BAG ZIPLOCK 12X15 (MISCELLANEOUS) ×1 IMPLANT
BASE TIBIA ATTUNE KNEE SYS SZ6 (Knees) IMPLANT
BLADE SAG 18X100X1.27 (BLADE) ×1 IMPLANT
BLADE SAW SGTL 11.0X1.19X90.0M (BLADE) ×1 IMPLANT
BNDG ELASTIC 6X5.8 VLCR STR LF (GAUZE/BANDAGES/DRESSINGS) ×1 IMPLANT
BOWL SMART MIX CTS (DISPOSABLE) ×1 IMPLANT
CEMENT HV SMART SET (Cement) ×2 IMPLANT
COVER SURGICAL LIGHT HANDLE (MISCELLANEOUS) ×1 IMPLANT
CUFF TOURN SGL QUICK 34 (TOURNIQUET CUFF) ×1
CUFF TRNQT CYL 34X4.125X (TOURNIQUET CUFF) ×1 IMPLANT
DRAPE INCISE IOBAN 66X45 STRL (DRAPES) ×1 IMPLANT
DRAPE U-SHAPE 47X51 STRL (DRAPES) ×1 IMPLANT
DRSG AQUACEL AG ADV 3.5X10 (GAUZE/BANDAGES/DRESSINGS) ×1 IMPLANT
DURAPREP 26ML APPLICATOR (WOUND CARE) ×1 IMPLANT
ELECT REM PT RETURN 15FT ADLT (MISCELLANEOUS) ×1 IMPLANT
GLOVE BIO SURGEON STRL SZ 6.5 (GLOVE) IMPLANT
GLOVE BIO SURGEON STRL SZ7.5 (GLOVE) IMPLANT
GLOVE BIO SURGEON STRL SZ8 (GLOVE) ×1 IMPLANT
GLOVE BIOGEL PI IND STRL 6.5 (GLOVE) IMPLANT
GLOVE BIOGEL PI IND STRL 7.0 (GLOVE) IMPLANT
GLOVE BIOGEL PI IND STRL 8 (GLOVE) ×1 IMPLANT
GOWN STRL REUS W/ TWL LRG LVL3 (GOWN DISPOSABLE) ×1 IMPLANT
GOWN STRL REUS W/ TWL XL LVL3 (GOWN DISPOSABLE) IMPLANT
GOWN STRL REUS W/TWL LRG LVL3 (GOWN DISPOSABLE) ×1
GOWN STRL REUS W/TWL XL LVL3 (GOWN DISPOSABLE)
HANDPIECE INTERPULSE COAX TIP (DISPOSABLE) ×1
HOLDER FOLEY CATH W/STRAP (MISCELLANEOUS) IMPLANT
IMMOBILIZER KNEE 20 (SOFTGOODS) ×1
IMMOBILIZER KNEE 20 THIGH 36 (SOFTGOODS) ×1 IMPLANT
KIT TURNOVER KIT A (KITS) IMPLANT
MANIFOLD NEPTUNE II (INSTRUMENTS) ×1 IMPLANT
NS IRRIG 1000ML POUR BTL (IV SOLUTION) ×1 IMPLANT
PACK TOTAL KNEE CUSTOM (KITS) ×1 IMPLANT
PADDING CAST COTTON 6X4 STRL (CAST SUPPLIES) ×2 IMPLANT
PIN DRILL FIX HALF THREAD (BIT) IMPLANT
PIN STEINMAN FIXATION KNEE (PIN) IMPLANT
PROTECTOR NERVE ULNAR (MISCELLANEOUS) ×1 IMPLANT
SET HNDPC FAN SPRY TIP SCT (DISPOSABLE) ×1 IMPLANT
SPIKE FLUID TRANSFER (MISCELLANEOUS) ×1 IMPLANT
STRIP CLOSURE SKIN 1/2X4 (GAUZE/BANDAGES/DRESSINGS) ×2 IMPLANT
SUT MNCRL AB 4-0 PS2 18 (SUTURE) ×1 IMPLANT
SUT STRATAFIX 0 PDS 27 VIOLET (SUTURE) ×1
SUT VIC AB 2-0 CT1 27 (SUTURE) ×3
SUT VIC AB 2-0 CT1 TAPERPNT 27 (SUTURE) ×3 IMPLANT
SUTURE STRATFX 0 PDS 27 VIOLET (SUTURE) ×1 IMPLANT
TIBIA ATTUNE KNEE SYS BASE SZ6 (Knees) ×1 IMPLANT
TRAY FOLEY MTR SLVR 16FR STAT (SET/KITS/TRAYS/PACK) ×1 IMPLANT
TUBE SUCTION HIGH CAP CLEAR NV (SUCTIONS) ×1 IMPLANT
WATER STERILE IRR 1000ML POUR (IV SOLUTION) ×2 IMPLANT
WRAP KNEE MAXI GEL POST OP (GAUZE/BANDAGES/DRESSINGS) ×1 IMPLANT

## 2022-07-18 NOTE — Anesthesia Postprocedure Evaluation (Signed)
Anesthesia Post Note  Patient: Margaretha Sheffield  Procedure(s) Performed: TOTAL KNEE ARTHROPLASTY (Right: Knee)     Patient location during evaluation: PACU Anesthesia Type: Regional and General Level of consciousness: awake Pain management: pain level controlled Vital Signs Assessment: post-procedure vital signs reviewed and stable Respiratory status: spontaneous breathing, nonlabored ventilation and respiratory function stable Cardiovascular status: blood pressure returned to baseline and stable Postop Assessment: no apparent nausea or vomiting Anesthetic complications: no   No notable events documented.  Last Vitals:  Vitals:   07/18/22 0915 07/18/22 0930  BP: 131/62 126/71  Pulse: 60 (!) 59  Resp: 18 12  Temp:    SpO2: 95% 98%    Last Pain:  Vitals:   07/18/22 0930  TempSrc:   PainSc: Asleep                 Nilda Simmer

## 2022-07-18 NOTE — Discharge Instructions (Signed)
Monica Arabian, MD Total Joint Specialist [facility] [address] [place], [place] [address] [phone]  TOTAL KNEE REPLACEMENT POSTOPERATIVE DIRECTIONS    Knee Rehabilitation, Guidelines Following Surgery  Results after knee surgery are often greatly improved when you follow the exercise, range of motion and muscle strengthening exercises prescribed by your doctor. Safety measures are also important to protect the knee from further injury. If any of these exercises cause you to have increased pain or swelling in your knee joint, decrease the amount until you are comfortable again and slowly increase them. If you have problems or questions, call your caregiver or physical therapist for advice.   BLOOD CLOT PREVENTION Resume your normal dose of Eliquis, 91m twice daily. Do not take any NSAIDs (Advil, Aleve, Ibuprofen, Meloxicam, etc.) until you have discontinued the Eliquis.     HOME CARE INSTRUCTIONS  Remove items at home which could result in a fall. This includes throw rugs or furniture in walking pathways.  ICE to the affected knee as much as tolerated. Icing helps control swelling. If the swelling is well controlled you will be more comfortable and rehab easier. Continue to use ice on the knee for pain and swelling from surgery. You may notice swelling that will progress down to the foot and ankle. This is normal after surgery. Elevate the leg when you are not up walking on it.    Continue to use the breathing machine which will help keep your temperature down. It is common for your temperature to cycle up and down following surgery, especially at night when you are not up moving around and exerting yourself. The breathing machine keeps your lungs expanded and your temperature down. Do not place pillow under the operative knee, focus on keeping the knee straight while resting  DIET You may resume your previous home diet once you are discharged from the  hospital.  DRESSING / WOUND CARE / SHOWERING Keep your bulky bandage on for 2 days. On the third post-operative day you may remove the Ace bandage and gauze. There is a waterproof adhesive bandage on your skin which will stay in place until your first follow-up appointment. Once you remove this you will not need to place another bandage You may begin showering 3 days following surgery, but do not submerge the incision under water.  ACTIVITY For the first 5 days, the key is rest and control of pain and swelling Do your home exercises twice a day starting on post-operative day 3. On the days you go to physical therapy, just do the home exercises once that day. You should rest, ice and elevate the leg for 50 minutes out of every hour. Get up and walk/stretch for 10 minutes per hour. After 5 days you can increase your activity slowly as tolerated. Walk with your walker as instructed. Use the walker until you are comfortable transitioning to a cane. Walk with the cane in the opposite hand of the operative leg. You may discontinue the cane once you are comfortable and walking steadily. Avoid periods of inactivity such as sitting longer than an hour when not asleep. This helps prevent blood clots.  You may discontinue the knee immobilizer once you are able to perform a straight leg raise while lying down. You may resume a sexual relationship in one month or when given the OK by your doctor.  You may return to work once you are cleared by your doctor.  Do not drive a car for 6  weeks or until released by your surgeon.  Do not drive while taking narcotics.  TED HOSE STOCKINGS Wear the elastic stockings on both legs for three weeks following surgery during the day. You may remove them at night for sleeping.  WEIGHT BEARING Weight bearing as tolerated with assist device (walker, cane, etc) as directed, use it as long as suggested by your surgeon or therapist, typically at least 4-6 weeks.  POSTOPERATIVE  CONSTIPATION PROTOCOL Constipation - defined medically as fewer than three stools per week and severe constipation as less than one stool per week.  One of the most common issues patients have following surgery is constipation.  Even if you have a regular bowel pattern at home, your normal regimen is likely to be disrupted due to multiple reasons following surgery.  Combination of anesthesia, postoperative narcotics, change in appetite and fluid intake all can affect your bowels.  In order to avoid complications following surgery, here are some recommendations in order to help you during your recovery period.  Colace (docusate) - Pick up an over-the-counter form of Colace or another stool softener and take twice a day as long as you are requiring postoperative pain medications.  Take with a full glass of water daily.  If you experience loose stools or diarrhea, hold the colace until you stool forms back up. If your symptoms do not get better within 1 week or if they get worse, check with your doctor. Dulcolax (bisacodyl) - Pick up over-the-counter and take as directed by the product packaging as needed to assist with the movement of your bowels.  Take with a full glass of water.  Use this product as needed if not relieved by Colace only.  MiraLax (polyethylene glycol) - Pick up over-the-counter to have on hand. MiraLax is a solution that will increase the amount of water in your bowels to assist with bowel movements.  Take as directed and can mix with a glass of water, juice, soda, coffee, or tea. Take if you go more than two days without a movement. Do not use MiraLax more than once per day. Call your doctor if you are still constipated or irregular after using this medication for 7 days in a row.  If you continue to have problems with postoperative constipation, please contact the office for further assistance and recommendations.  If you experience "the worst abdominal pain ever" or develop nausea or  vomiting, please contact the office immediatly for further recommendations for treatment.  ITCHING If you experience itching with your medications, try taking only a single pain pill, or even half a pain pill at a time.  You can also use Benadryl over the counter for itching or also to help with sleep.   MEDICATIONS See your medication summary on the "After Visit Summary" that the nursing staff will review with you prior to discharge.  You may have some home medications which will be placed on hold until you complete the course of blood thinner medication.  It is important for you to complete the blood thinner medication as prescribed by your surgeon.  Continue your approved medications as instructed at time of discharge.  PRECAUTIONS If you experience chest pain or shortness of breath - call 911 immediately for transfer to the hospital emergency department.  If you develop a fever greater that 101 F, purulent drainage from wound, increased redness or drainage from wound, foul odor from the wound/dressing, or calf pain - CONTACT YOUR SURGEON.  FOLLOW-UP APPOINTMENTS Make sure you keep all of your appointments after your operation with your surgeon and caregivers. You should call the office at the above phone number and make an appointment for approximately two weeks after the date of your surgery or on the date instructed by your surgeon outlined in the "After Visit Summary".  RANGE OF MOTION AND STRENGTHENING EXERCISES  Rehabilitation of the knee is important following a knee injury or an operation. After just a few days of immobilization, the muscles of the thigh which control the knee become weakened and shrink (atrophy). Knee exercises are designed to build up the tone and strength of the thigh muscles and to improve knee motion. Often times heat used for twenty to thirty minutes before working out will loosen up your tissues and help with  improving the range of motion but do not use heat for the first two weeks following surgery. These exercises can be done on a training (exercise) mat, on the floor, on a table or on a bed. Use what ever works the best and is most comfortable for you Knee exercises include:  Leg Lifts - While your knee is still immobilized in a splint or cast, you can do straight leg raises. Lift the leg to 60 degrees, hold for 3 sec, and slowly lower the leg. Repeat 10-20 times 2-3 times daily. Perform this exercise against resistance later as your knee gets better.  Quad and Hamstring Sets - Tighten up the muscle on the front of the thigh (Quad) and hold for 5-10 sec. Repeat this 10-20 times hourly. Hamstring sets are done by pushing the foot backward against an object and holding for 5-10 sec. Repeat as with quad sets.  Leg Slides: Lying on your back, slowly slide your foot toward your buttocks, bending your knee up off the floor (only go as far as is comfortable). Then slowly slide your foot back down until your leg is flat on the floor again. Angel Wings: Lying on your back spread your legs to the side as far apart as you can without causing discomfort.  A rehabilitation program following serious knee injuries can speed recovery and prevent re-injury in the future due to weakened muscles. Contact your doctor or a physical therapist for more information on knee rehabilitation.   POST-OPERATIVE OPIOID TAPER INSTRUCTIONS: It is important to wean off of your opioid medication as soon as possible. If you do not need pain medication after your surgery it is ok to stop day one. Opioids include: Codeine, Hydrocodone(Norco, Vicodin), Oxycodone(Percocet, oxycontin) and hydromorphone amongst others.  Long term and even short term use of opiods can cause: Increased pain response Dependence Constipation Depression Respiratory depression And more.  Withdrawal symptoms can include Flu like symptoms Nausea, vomiting And  more Techniques to manage these symptoms Hydrate well Eat regular healthy meals Stay active Use relaxation techniques(deep breathing, meditating, yoga) Do Not substitute Alcohol to help with tapering If you have been on opioids for less than two weeks and do not have pain than it is ok to stop all together.  Plan to wean off of opioids This plan should start within one week post op of your joint replacement. Maintain the same interval or time between taking each dose and first decrease the dose.  Cut the total daily intake of opioids by one tablet each day Next start to increase the time between doses. The last dose that should be eliminated is the evening dose.   IF YOU ARE TRANSFERRED TO  A SKILLED REHAB FACILITY If the patient is transferred to a skilled rehab facility following release from the hospital, a list of the current medications will be sent to the facility for the patient to continue.  When discharged from the skilled rehab facility, please have the facility set up the patient's [place] prior to being released. Also, the skilled facility will be responsible for providing the patient with their medications at time of release from the facility to include their pain medication, the muscle relaxants, and their blood thinner medication. If the patient is still at the rehab facility at time of the two week follow up appointment, the skilled rehab facility will also need to assist the patient in arranging follow up appointment in our office and any transportation needs.  MAKE SURE YOU:  Understand these instructions.  Get help right away if you are not doing well or get worse.   DENTAL ANTIBIOTICS:  In most cases prophylactic antibiotics for Dental procdeures after total joint surgery are not necessary.  Exceptions are as follows:  1. History of prior total joint infection  2. Severely immunocompromised (Organ Transplant, cancer chemotherapy, Rheumatoid  biologic meds such as [place])  3. Poorly controlled diabetes (A1C &gt; 8.0, blood glucose over 200)  If you have one of these conditions, contact your surgeon for an antibiotic prescription, prior to your dental procedure.    Pick up stool softner and laxative for home use following surgery while on pain medications. Do not submerge incision under water. Please use good hand washing techniques while changing dressing each day. May shower starting three days after surgery. Please use a clean towel to pat the incision dry following showers. Continue to use ice for pain and swelling after surgery. Do not use any lotions or creams on the incision until instructed by your surgeon.

## 2022-07-18 NOTE — Transfer of Care (Signed)
Immediate Anesthesia Transfer of Care Note  Patient: Monica Anderson  Procedure(s) Performed: Procedure(s): TOTAL KNEE ARTHROPLASTY (Right)  Patient Location: PACU  Anesthesia Type:General  Level of Consciousness: Alert, Awake, Oriented  Airway & Oxygen Therapy: Patient Spontanous Breathing  Post-op Assessment: Report given to RN  Post vital signs: Reviewed and stable  Last Vitals:  Vitals:   07/18/22 0552  BP: (!) 154/69  Pulse: 62  Resp: 16  Temp: 37.1 C  SpO2: [id number]    Complications: No apparent anesthesia complications

## 2022-07-18 NOTE — Progress Notes (Signed)
Orthopedic Tech Progress Note Patient Details:  Monica Anderson 1945/08/11 [id number]  CPM Right Knee CPM Right Knee: Off Right Knee Flexion (Degrees): 40 Right Knee Extension (Degrees): 10  Post Interventions Patient Tolerated: Well Patient removed from [place] by 3W staff at ~1215. [facility] 07/18/2022, 1:05 PM

## 2022-07-18 NOTE — Op Note (Addendum)
OPERATIVE REPORT-TOTAL KNEE ARTHROPLASTY   Pre-operative diagnosis- Osteoarthritis  Right knee(s)  Post-operative diagnosis- Osteoarthritis Right knee(s)  Procedure-  Right  Total Knee Arthroplasty  Surgeon- Dione Plover. Jarryd Gratz, MD  Assistant- Shearon Balo, PA-C   Anesthesia-  Adductor canal block and general  EBL- 25 ml   Drains None  Tourniquet time-  Total Tourniquet Time Documented: Thigh (Right) - 31 minutes Total: Thigh (Right) - 31 minutes     Complications- None  Condition-PACU - hemodynamically stable.   Brief Clinical Note  Monica Anderson is a 77 y.o. year old female with end stage OA of her right knee with progressively worsening pain and dysfunction. She has constant pain, with activity and at rest and significant functional deficits with difficulties even with ADLs. She has had extensive non-op management including analgesics, injections of cortisone and viscosupplements, and home exercise program, but remains in significant pain with significant dysfunction.Radiographs show bone on bone arthritis medial and patellofemoral. She presents now for right Total Knee Arthroplasty.     Procedure in detail---   The patient is brought into the operating room and positioned supine on the operating table. After successful administration of  Adductor canal block and general   a tourniquet is placed high on the  Right thigh(s) and the lower extremity is prepped and draped in the usual sterile fashion. Time out is performed by the operating team and then the  Right lower extremity is wrapped in Esmarch, knee flexed and the tourniquet inflated to 300 mmHg.       A midline incision is made with a ten blade through the subcutaneous tissue to the level of the extensor mechanism. A fresh blade is used to make a medial parapatellar arthrotomy. Soft tissue over the proximal medial tibia is subperiosteally elevated to the joint line with a knife and into the semimembranosus bursa with  a Cobb elevator. Soft tissue over the proximal lateral tibia is elevated with attention being paid to avoiding the patellar tendon on the tibial tubercle. The patella is everted, knee flexed 90 degrees and the ACL and PCL are removed. Findings are bone on bone medial and patellofemoral with large global osteophytes        The drill is used to create a starting hole in the distal femur and the canal is thoroughly irrigated with sterile saline to remove the fatty contents. The 5 degree Right  valgus alignment guide is placed into the femoral canal and the distal femoral cutting block is pinned to remove 9 mm off the distal femur. Resection is made with an oscillating saw.      The tibia is subluxed forward and the menisci are removed. The extramedullary alignment guide is placed referencing proximally at the medial aspect of the tibial tubercle and distally along the second metatarsal axis and tibial crest. The block is pinned to remove 60m off the more deficient medial  side. Resection is made with an oscillating saw. Size 6is the most appropriate size for the tibia and the proximal tibia is prepared with the modular drill and keel punch for that size.      The femoral sizing guide is placed and size 6 is most appropriate. Rotation is marked off the epicondylar axis and confirmed by creating a rectangular flexion gap at 90 degrees. The size 6 cutting block is pinned in this rotation and the anterior, posterior and chamfer cuts are made with the oscillating saw. The intercondylar block is then placed and that cut is made.  Trial size 6 tibial component, trial size 6 posterior stabilized femur and a 6  mm posterior stabilized rotating platform insert trial is placed. Full extension is achieved with excellent varus/valgus and anterior/posterior balance throughout full range of motion. The patella is everted and thickness measured to be 22  mm. Free hand resection is taken to 12 mm, a 38 template is placed, lug  holes are drilled, trial patella is placed, and it tracks normally. Osteophytes are removed off the posterior femur with the trial in place. All trials are removed and the cut bone surfaces prepared with pulsatile lavage. Cement is mixed and once ready for implantation, the size 6 tibial implant, size  6 posterior stabilized femoral component, and the size 38 patella are cemented in place and the patella is held with the clamp. The trial insert is placed and the knee held in full extension. The Exparel (20 ml mixed with 60 ml saline) is injected into the extensor mechanism, posterior capsule, medial and lateral gutters and subcutaneous tissues.  All extruded cement is removed and once the cement is hard the permanent 6 mm posterior stabilized rotating platform insert is placed into the tibial tray.      The wound is copiously irrigated with saline solution and the extensor mechanism closed with # 0 Stratofix suture. The tourniquet is released for a total tourniquet time of 31  minutes. Flexion against gravity is 140 degrees and the patella tracks normally. Subcutaneous tissue is closed with 2.0 vicryl and subcuticular with running 4.0 Monocryl. The incision is cleaned and dried and steri-strips and a bulky sterile dressing are applied. The limb is placed into a knee immobilizer and the patient is awakened and transported to recovery in stable condition.      Please note that a surgical assistant was a medical necessity for this procedure in order to perform it in a safe and expeditious manner. Surgical assistant was necessary to retract the ligaments and vital neurovascular structures to prevent injury to them and also necessary for proper positioning of the limb to allow for anatomic placement of the prosthesis.   Dione Plover Jamine Wingate, MD    07/18/2022, 8:04 AM

## 2022-07-18 NOTE — Care Plan (Signed)
Ortho Bundle Case Management Note  Patient Details  Name: Monica Anderson MRN: [id number] Date of Birth: 1946-01-22  R TKA on 07-18-22 DCP:  Home with husband DME:  No needs, has a RW PT:  ProPT on 07-21-22                   DME Arranged:  N/A DME Agency:  NA  HH Arranged:  NA HH Agency:  NA  Additional Comments: Please contact me with any questions of if this plan should need to change.  Marianne Sofia, RN,CCM [facility]  [phone] 07/18/2022, 5:11 PM

## 2022-07-18 NOTE — Anesthesia Procedure Notes (Addendum)
Anesthesia Regional Block: Adductor canal block   Pre-Anesthetic Checklist: , timeout performed,  Correct Patient, Correct Site, Correct Laterality,  Correct Procedure, Correct Position, site marked,  Risks and benefits discussed,  Surgical consent,  Pre-op evaluation,  At surgeon's request and post-op pain management  Laterality: Right  Prep: chloraprep       Needles:  Injection technique: Single-shot  Needle Type: Echogenic Stimulator Needle     Needle Length: 9cm  Needle Gauge: 21     Additional Needles:   Procedures:,,,, ultrasound used (permanent image in chart),,    Narrative:  Start time: 07/18/2022 6:48 AM End time: 07/18/2022 6:50 AM Injection made incrementally with aspirations every 5 mL.  Performed by: Personally  Anesthesiologist: Nilda Simmer, MD  Additional Notes: Discussed risks and benefits of nerve block including, but not limited to, prolonged and/or permanent nerve injury involving sensory and/or motor function. Monitors were applied and a time-out was performed. The nerve and associated structures were visualized under ultrasound guidance. After negative aspiration, local anesthetic was slowly injected around the nerve. There was no evidence of high pressure during the procedure. There were no paresthesias. VSS remained stable and the patient tolerated the procedure well.

## 2022-07-18 NOTE — Anesthesia Procedure Notes (Signed)
Procedure Name: LMA Insertion Date/Time: 07/18/2022 7:09 AM  Performed by: Gerald Leitz, CRNAPre-anesthesia Checklist: Patient identified, Patient being monitored, Timeout performed, Emergency Drugs available and Suction available Patient Re-evaluated:Patient Re-evaluated prior to induction Oxygen Delivery Method: Circle system utilized Preoxygenation: Pre-oxygenation with 100% oxygen Induction Type: IV induction Ventilation: Mask ventilation without difficulty LMA: LMA inserted Tube type: Oral Number of attempts: 1 Placement Confirmation: positive ETCO2 and breath sounds checked- equal and bilateral Tube secured with: Tape Dental Injury: Teeth and Oropharynx as per pre-operative assessment

## 2022-07-18 NOTE — Evaluation (Signed)
Physical Therapy Evaluation Patient Details Name: Monica Anderson MRN: [id number] DOB: [birth year] Today's Date: 07/18/2022  History of Present Illness  77 yo female, S?P  right TKA 07/18/22.PMH: Meniere's,  afib, Pleaural effusion  Clinical Impression   Pt admitted with above diagnosis.  Pt currently with functional limitations due to the deficits listed below (see PT Problem List). Pt will benefit from skilled PT to increase their independence and safety with mobility to allow discharge to the venue listed below.     The patient  ambulated x 20' with Rw. Patient should progress well to Dc home.     Recommendations for follow up therapy are one component of a multi-disciplinary discharge planning process, led by the attending physician.  Recommendations may be updated based on patient status, additional functional criteria and insurance authorization.  Follow Up Recommendations Follow physician's recommendations for discharge plan and follow up therapies      Assistance Recommended at Discharge    Patient can return home with the following  A little help with walking and/or transfers;Assistance with cooking/housework;Assist for transportation;Help with stairs or ramp for entrance    Equipment Recommendations Rolling walker (2 wheels)  Recommendations for Other Services       Functional Status Assessment Patient has had a recent decline in their functional status and demonstrates the ability to make significant improvements in function in a reasonable and predictable amount of time.     Precautions / Restrictions Precautions Precautions: Fall;Knee Required Braces or Orthoses: Knee Immobilizer - Right Knee Immobilizer - Right: Discontinue once straight leg raise with < 10 degree lag      Mobility  Bed Mobility Overal bed mobility: Needs Assistance Bed Mobility: Supine to Sit     Supine to sit: Min assist     General bed mobility comments: support right leg     Transfers Overall transfer level: Needs assistance Equipment used: Rolling walker (2 wheels) Transfers: Sit to/from Stand Sit to Stand: Min assist           General transfer comment: from bed and BSC, cues for hand and right leg position    Ambulation/Gait Ambulation/Gait assistance: Min assist Gait Distance (Feet): 20 Feet Assistive device: Rolling walker (2 wheels) Gait Pattern/deviations: Step-to pattern, Step-through pattern       General Gait Details: cues for sequence  Stairs            Wheelchair Mobility    Modified Rankin (Stroke Patients Only)       Balance Overall balance assessment: Mild deficits observed, not formally tested                                           Pertinent Vitals/Pain Pain Assessment Pain Assessment: 0-10 Pain Score: 3  Pain Location: right knee Pain Descriptors / Indicators: Discomfort Pain Intervention(s): Monitored during session, Premedicated before session, Ice applied    Home Living Family/patient expects to be discharged to:: Private residence Living Arrangements: Spouse/significant other Available Help at Discharge: Family;Available 24 hours/day Type of Home: House Home Access: Stairs to enter Entrance Stairs-Rails: None Entrance Stairs-Number of Steps: 2   Home Layout: One level Home Equipment: None      Prior Function Prior Level of Function : Independent/Modified Independent                     Hand Dominance   Dominant Hand: Right  Extremity/Trunk Assessment   Upper Extremity Assessment Upper Extremity Assessment: Overall WFL for tasks assessed    Lower Extremity Assessment Lower Extremity Assessment: RLE deficits/detail RLE Deficits / Details: able to SLR,       Communication   Communication: No difficulties  Cognition Arousal/Alertness: Awake/alert Behavior During Therapy: WFL for tasks assessed/performed Overall Cognitive Status: Within Functional  Limits for tasks assessed                                          General Comments General comments (skin integrity, edema, etc.): reliant on RW    Exercises Total Joint Exercises Ankle Circles/Pumps: AROM, Both   Assessment/Plan    PT Assessment Patient needs continued PT services  PT Problem List Decreased strength;Decreased mobility;Decreased safety awareness;Decreased range of motion;Decreased knowledge of precautions;Decreased activity tolerance;Decreased knowledge of use of DME       PT Treatment Interventions DME instruction;Therapeutic activities;Gait training;Therapeutic exercise;Patient/family education;Stair training;Functional mobility training    PT Goals (Current goals can be found in the Care Plan section)  Acute Rehab PT Goals Patient Stated Goal: go home PT Goal Formulation: With patient Time For Goal Achievement: 07/25/22 Potential to Achieve Goals: Good    Frequency 7X/week     Co-evaluation               AM-PAC PT "6 Clicks" Mobility  Outcome Measure Help needed turning from your back to your side while in a flat bed without using bedrails?: A Little Help needed moving from lying on your back to sitting on the side of a flat bed without using bedrails?: A Little Help needed moving to and from a bed to a chair (including a wheelchair)?: A Little Help needed standing up from a chair using your arms (e.g., wheelchair or bedside chair)?: A Little Help needed to walk in hospital room?: A Little Help needed climbing 3-5 steps with a railing? : A Lot 6 Click Score: 17    End of Session Equipment Utilized During Treatment: Gait belt;Right knee immobilizer Activity Tolerance: Patient tolerated treatment well Patient left: in chair;with chair alarm set;with call bell/phone within reach Nurse Communication: Mobility status PT Visit Diagnosis: Unsteadiness on feet (R26.81);Muscle weakness (generalized) (M62.81);Difficulty in walking, not  elsewhere classified (R26.2)    Time: [id number] PT Time Calculation (min) (ACUTE ONLY): 23 min   Charges:   PT Evaluation $PT Eval Low Complexity: 1 Low PT Treatments $Gait Training: 8-22 mins        [place] Office [phone] Weekend [id number]   Jaquel, Leister 07/18/2022, 4:31 PM

## 2022-07-18 NOTE — Progress Notes (Signed)
Orthopedic Tech Progress Note Patient Details:  Monica Anderson [birth year] [id number]  CPM Right Knee CPM Right Knee: On Right Knee Flexion (Degrees): 40 Right Knee Extension (Degrees): 10  Post Interventions Patient Tolerated: Well  Vernona Rieger 07/18/2022, 9:16 AM

## 2022-07-18 NOTE — Interval H&P Note (Signed)
History and Physical Interval Note:  07/18/2022 6:29 AM  Monica Anderson  has presented today for surgery, with the diagnosis of right knee osteoarthritis.  The various methods of treatment have been discussed with the patient and family. After consideration of risks, benefits and other options for treatment, the patient has consented to  Procedure(s): TOTAL KNEE ARTHROPLASTY (Right) as a surgical intervention.  The patient's history has been reviewed, patient examined, no change in status, stable for surgery.  I have reviewed the patient's chart and labs.  Questions were answered to the patient's satisfaction.     Pilar Plate [place]

## 2022-07-19 ENCOUNTER — Encounter (HOSPITAL_COMMUNITY): Payer: Self-pay | Admitting: Orthopedic Surgery

## 2022-07-19 DIAGNOSIS — I071 Rheumatic tricuspid insufficiency: Secondary | ICD-10-CM | POA: Diagnosis not present

## 2022-07-19 DIAGNOSIS — I517 Cardiomegaly: Secondary | ICD-10-CM | POA: Diagnosis not present

## 2022-07-19 DIAGNOSIS — E785 Hyperlipidemia, unspecified: Secondary | ICD-10-CM | POA: Diagnosis not present

## 2022-07-19 DIAGNOSIS — I4891 Unspecified atrial fibrillation: Secondary | ICD-10-CM | POA: Diagnosis not present

## 2022-07-19 DIAGNOSIS — I10 Essential (primary) hypertension: Secondary | ICD-10-CM | POA: Diagnosis not present

## 2022-07-19 DIAGNOSIS — M1711 Unilateral primary osteoarthritis, right knee: Secondary | ICD-10-CM | POA: Diagnosis not present

## 2022-07-19 LAB — CBC
HCT: 39.7 % (ref 36.0–46.0)
Hemoglobin: 12.8 g/dL (ref 12.0–15.0)
MCH: 30.5 pg (ref 26.0–34.0)
MCHC: 32.2 g/dL (ref 30.0–36.0)
MCV: 94.7 fL (ref 80.0–100.0)
Platelets: 171 10*3/uL (ref 150–400)
RBC: 4.19 MIL/uL (ref 3.87–5.11)
RDW: 14.5 % (ref 11.5–15.5)
WBC: 10.9 10*3/uL — ABNORMAL HIGH (ref 4.0–10.5)
nRBC: 0 % (ref 0.0–0.2)

## 2022-07-19 LAB — BASIC METABOLIC PANEL
Anion gap: 8 (ref 5–15)
BUN: 17 mg/dL (ref 8–23)
CO2: 25 mmol/L (ref 22–32)
Calcium: 8.7 mg/dL — ABNORMAL LOW (ref 8.9–10.3)
Chloride: 106 mmol/L (ref 98–111)
Creatinine, Ser: 0.89 mg/dL (ref 0.44–1.00)
GFR, Estimated: >60 mL/min (ref >60)
Glucose, Bld: 143 mg/dL — ABNORMAL HIGH (ref 70–99)
Potassium: 4.2 mmol/L (ref 3.5–5.1)
Sodium: 139 mmol/L (ref 135–145)

## 2022-07-19 MED ORDER — OXYCODONE HCL 5 MG PO TABS: 5.0000 mg | ORAL_TABLET | Freq: Four times a day (QID) | ORAL | 0 refills | Status: DC | PRN

## 2022-07-19 MED ORDER — METHOCARBAMOL 500 MG PO TABS: 500.0000 mg | ORAL_TABLET | Freq: Four times a day (QID) | ORAL | 0 refills | Status: DC | PRN

## 2022-07-19 MED ORDER — TRAMADOL HCL 50 MG PO TABS: 50.0000 mg | ORAL_TABLET | Freq: Four times a day (QID) | ORAL | 0 refills | Status: DC | PRN

## 2022-07-19 NOTE — Plan of Care (Signed)

## 2022-07-19 NOTE — Progress Notes (Signed)
Physical Therapy Treatment Patient Details Name: Monica Anderson MRN: [id number] DOB: June 22, 1945 Today's Date: 07/19/2022   History of Present Illness 77 yo female, S/P  right TKA 07/18/22.PMH: Meniere's,  afib, Pleaural effusion    PT Comments    Patient reports pain is mild. Progressing well. Will practice steps with family next visit.   Recommendations for follow up therapy are one component of a multi-disciplinary discharge planning process, led by the attending physician.  Recommendations may be updated based on patient status, additional functional criteria and insurance authorization.  Follow Up Recommendations  Follow physician's recommendations for discharge plan and follow up therapies     Assistance Recommended at Discharge Set up Supervision/Assistance  Patient can return home with the following A little help with walking and/or transfers;Assistance with cooking/housework;Assist for transportation;Help with stairs or ramp for entrance   Equipment Recommendations  None recommended by PT    Recommendations for Other Services       Precautions / Restrictions Precautions Precautions: Fall;Knee     Mobility  Bed Mobility   Bed Mobility: Supine to Sit     Supine to sit: Modified independent (Device/Increase time)          Transfers Overall transfer level: Needs assistance Equipment used: Rolling walker (2 wheels) Transfers: Sit to/from Stand Sit to Stand: Supervision                Ambulation/Gait Ambulation/Gait assistance: Supervision Gait Distance (Feet): 100 Feet Assistive device: Rolling walker (2 wheels) Gait Pattern/deviations: Step-to pattern, Step-through pattern Gait velocity: decr     General Gait Details: cues for sequence   Stairs             Wheelchair Mobility    Modified Rankin (Stroke Patients Only)       Balance Overall balance assessment: Mild deficits observed, not formally tested                                           Cognition Arousal/Alertness: Awake/alert Behavior During Therapy: WFL for tasks assessed/performed Overall Cognitive Status: Within Functional Limits for tasks assessed                                          Exercises Total Joint Exercises Ankle Circles/Pumps: AROM, Both, 10 reps Quad Sets: AROM, Both, 10 reps Heel Slides: AROM, Right, 10 reps Hip ABduction/ADduction: AROM, Right, 10 reps Straight Leg Raises: AROM, Right, 10 reps Long Arc Quad: AROM, Right, 10 reps Knee Flexion: AROM, Right, 10 reps    General Comments        Pertinent Vitals/Pain Pain Assessment Pain Score: 2  Pain Location: right knee Pain Descriptors / Indicators: Discomfort Pain Intervention(s): Premedicated before session, Ice applied    Home Living                          Prior Function            PT Goals (current goals can now be found in the care plan section) Progress towards PT goals: Progressing toward goals    Frequency    7X/week      PT Plan Current plan remains appropriate    Co-evaluation  AM-PAC PT "6 Clicks" Mobility   Outcome Measure  Help needed turning from your back to your side while in a flat bed without using bedrails?: None Help needed moving from lying on your back to sitting on the side of a flat bed without using bedrails?: None Help needed moving to and from a bed to a chair (including a wheelchair)?: A Little Help needed standing up from a chair using your arms (e.g., wheelchair or bedside chair)?: A Little Help needed to walk in hospital room?: A Little Help needed climbing 3-5 steps with a railing? : A Little 6 Click Score: 20    End of Session Equipment Utilized During Treatment: Gait belt Activity Tolerance: Patient tolerated treatment well Patient left: in chair;with chair alarm set;with call bell/phone within reach Nurse Communication: Mobility status PT Visit  Diagnosis: Unsteadiness on feet (R26.81);Muscle weakness (generalized) (M62.81);Difficulty in walking, not elsewhere classified (R26.2)     Time: [id number] PT Time Calculation (min) (ACUTE ONLY): 25 min  Charges:  $Gait Training: 8-22 mins $Therapeutic Exercise: 8-22 mins                     [place] Office [phone] Weekend [id number]    Milena, [place] 07/19/2022, 1:34 PM

## 2022-07-19 NOTE — Progress Notes (Signed)
Physical Therapy Treatment Patient Details Name: Monica Anderson MRN: [id number] DOB: Oct 06, 1945 Today's Date: 07/19/2022   History of Present Illness 77 yo female, S/P  right TKA 07/18/22.PMH: Meniere's,  afib, Pleaural effusion    PT Comments    Patient has met PT goals for DC.   Recommendations for follow up therapy are one component of a multi-disciplinary discharge planning process, led by the attending physician.  Recommendations may be updated based on patient status, additional functional criteria and insurance authorization.  Follow Up Recommendations  Follow physician's recommendations for discharge plan and follow up therapies     Assistance Recommended at Discharge Set up Supervision/Assistance  Patient can return home with the following A little help with walking and/or transfers;Assistance with cooking/housework;Assist for transportation;Help with stairs or ramp for entrance   Equipment Recommendations  None recommended by PT    Recommendations for Other Services       Precautions / Restrictions Precautions Precautions: Fall;Knee     Mobility  Bed Mobility   Bed Mobility: Supine to Sit     Supine to sit: Modified independent (Device/Increase time)     General bed mobility comments: in recliner.    Transfers Overall transfer level: Needs assistance Equipment used: Rolling walker (2 wheels) Transfers: Sit to/from Stand Sit to Stand: Supervision                Ambulation/Gait Ambulation/Gait assistance: Supervision Gait Distance (Feet): 50 Feet Assistive device: Rolling walker (2 wheels) Gait Pattern/deviations: Step-to pattern, Step-through pattern Gait velocity: decr     General Gait Details: cues for sequence   Stairs Stairs: Yes Stairs assistance: Min assist Stair Management: No rails, Backwards, With walker Number of Stairs: 2 General stair comments: son  and spouse present to instruct and asist on steps.   Wheelchair  Mobility    Modified Rankin (Stroke Patients Only)       Balance Overall balance assessment: Mild deficits observed, not formally tested                                          Cognition Arousal/Alertness: Awake/alert Behavior During Therapy: WFL for tasks assessed/performed Overall Cognitive Status: Within Functional Limits for tasks assessed                                          Exercises   General Comments        Pertinent Vitals/Pain Pain Assessment Pain Score: 4  Pain Location: right knee Pain Descriptors / Indicators: Discomfort Pain Intervention(s): Monitored during session    Home Living                          Prior Function            PT Goals (current goals can now be found in the care plan section) Progress towards PT goals: Progressing toward goals    Frequency    7X/week      PT Plan Current plan remains appropriate    Co-evaluation              AM-PAC PT "6 Clicks" Mobility   Outcome Measure  Help needed turning from your back to your side while in a flat bed without using bedrails?: None Help needed moving  from lying on your back to sitting on the side of a flat bed without using bedrails?: None Help needed moving to and from a bed to a chair (including a wheelchair)?: A Little Help needed standing up from a chair using your arms (e.g., wheelchair or bedside chair)?: A Little Help needed to walk in hospital room?: A Little Help needed climbing 3-5 steps with a railing? : A Little 6 Click Score: 20    End of Session Equipment Utilized During Treatment: Gait belt Activity Tolerance: Patient tolerated treatment well Patient left: in chair;with call bell/phone within reach;with family/visitor present Nurse Communication: Mobility status PT Visit Diagnosis: Unsteadiness on feet (R26.81);Muscle weakness (generalized) (M62.81);Difficulty in walking, not elsewhere classified (R26.2)      Time: 1140-1200 PT Time Calculation (min) (ACUTE ONLY): 20 min  Charges:  gait                     [place] [phone] Weekend [id number]    Ylianna, Arbaiza 07/19/2022, 1:39 PM

## 2022-07-19 NOTE — Discharge Summary (Signed)
Physician Discharge Summary   Patient ID: Barclay Gendreau MRN: [id number] DOB/AGE: [birth year] 77 y.o.  Admit date: 07/18/2022 Discharge date: 07/19/2022  Primary Diagnosis: Osteoarthritis, right knee   Admission Diagnoses:  Past Medical History:  Diagnosis Date   Anemia    Arthritis    Asymmetrical left sensorineural hearing loss 10/01/2019   Atrial fibrillation ([place]) 10/02/2019   Essential hypertension 10/03/2019   History of colon polyps    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, left ([place]) 01/17/2017   Meniere's disease 08/29/2019   with hearing loss in Lt ear   Mixed hyperlipidemia 10/03/2019   Multiple dysplastic nevi    Other hypertrophic cardiomyopathy ([place])    Pleural effusion    Discharge Diagnoses:   Principal Problem:   Osteoarthritis of right knee Active Problems:   OA (osteoarthritis) of knee  Estimated body mass index is 30.04 kg/m as calculated from the following:   Height as of this encounter: 5' 7"$  (1.702 m).   Weight as of this encounter: 87 kg.  Procedure:  Procedure(s) (LRB): TOTAL KNEE ARTHROPLASTY (Right)   Consults: None  HPI: Monica Anderson is a 77 y.o. year old female with end stage OA of her right knee with progressively worsening pain and dysfunction. She has constant pain, with activity and at rest and significant functional deficits with difficulties even with ADLs. She has had extensive non-op management including analgesics, injections of cortisone and viscosupplements, and home exercise program, but remains in significant pain with significant dysfunction.Radiographs show bone on bone arthritis medial and patellofemoral. She presents now for right Total Knee Arthroplasty.    Laboratory Data: Admission on 07/18/2022, Discharged on 07/19/2022  Component Date Value Ref Range Status   WBC 07/19/2022 10.9 (H)  4.0 - 10.5 K/uL Final   RBC 07/19/2022 4.19  3.87 - 5.11 MIL/uL Final   Hemoglobin 07/19/2022 12.8  12.0 -  15.0 g/dL Final   HCT 07/19/2022 39.7  36.0 - 46.0 % Final   MCV 07/19/2022 94.7  80.0 - 100.0 fL Final   MCH 07/19/2022 30.5  26.0 - 34.0 pg Final   MCHC 07/19/2022 32.2  30.0 - 36.0 g/dL Final   RDW 07/19/2022 14.5  11.5 - 15.5 % Final   Platelets 07/19/2022 171  150 - 400 K/uL Final   nRBC 07/19/2022 0.0  0.0 - 0.2 % Final   Performed at [facility], [place] [address] [place], [place] [address]   Sodium 07/19/2022 139  135 - 145 mmol/L Final   Potassium 07/19/2022 4.2  3.5 - 5.1 mmol/L Final   Chloride 07/19/2022 106  98 - 111 mmol/L Final   CO2 07/19/2022 25  22 - 32 mmol/L Final   Glucose, Bld 07/19/2022 143 (H)  70 - 99 mg/dL Final   Glucose reference range applies only to samples taken after fasting for at least 8 hours.   BUN 07/19/2022 17  8 - 23 mg/dL Final   Creatinine, Ser 07/19/2022 0.89  0.44 - 1.00 mg/dL Final   Calcium 07/19/2022 8.7 (L)  8.9 - 10.3 mg/dL Final   GFR, Estimated 07/19/2022 >60  >60 mL/min Final   Comment: (NOTE) Calculated using the CKD-EPI Creatinine Equation (2021)    Anion gap 07/19/2022 8  5 - 15 Final   Performed at [facility], [place] [address], [place], [place] [address]  Hospital Outpatient Visit on 07/08/2022  Component Date Value Ref Range Status   Sodium 07/08/2022 140  135 - 145  mmol/L Final   Potassium 07/08/2022 4.1  3.5 - 5.1 mmol/L Final   Chloride 07/08/2022 106  98 - 111 mmol/L Final   CO2 07/08/2022 28  22 - 32 mmol/L Final   Glucose, Bld 07/08/2022 103 (H)  70 - 99 mg/dL Final   Glucose reference range applies only to samples taken after fasting for at least 8 hours.   BUN 07/08/2022 25 (H)  8 - 23 mg/dL Final   Creatinine, Ser 07/08/2022 1.04 (H)  0.44 - 1.00 mg/dL Final   Calcium 07/08/2022 9.6  8.9 - 10.3 mg/dL Final   GFR, Estimated 07/08/2022 55 (L)  >60 mL/min Final   Comment: (NOTE) Calculated using the CKD-EPI Creatinine Equation (2021)    Anion gap 07/08/2022 6  5 - 15 Final    Performed at [facility], [place] [address], [place], [place] [address]   WBC 07/08/2022 5.4  4.0 - 10.5 K/uL Final   RBC 07/08/2022 4.92  3.87 - 5.11 MIL/uL Final   Hemoglobin 07/08/2022 15.2 (H)  12.0 - 15.0 g/dL Final   HCT 07/08/2022 46.1 (H)  36.0 - 46.0 % Final   MCV 07/08/2022 93.7  80.0 - 100.0 fL Final   MCH 07/08/2022 30.9  26.0 - 34.0 pg Final   MCHC 07/08/2022 33.0  30.0 - 36.0 g/dL Final   RDW 07/08/2022 14.2  11.5 - 15.5 % Final   Platelets 07/08/2022 204  150 - 400 K/uL Final   nRBC 07/08/2022 0.0  0.0 - 0.2 % Final   Performed at [facility], [place] [address], [place], [place] [address]   MRSA, PCR 07/08/2022 NEGATIVE  NEGATIVE Final   Staphylococcus aureus 07/08/2022 NEGATIVE  NEGATIVE Final   Comment: (NOTE) The Xpert SA Assay (FDA approved for NASAL specimens in patients 40 years of age and older), is one component of a comprehensive surveillance program. It is not intended to diagnose infection nor to guide or monitor treatment. Performed at [facility], [place] [address] [place], [place] [address]      X-Rays:No results found.  EKG: Orders placed or performed in visit on 07/06/22   EKG 12-Lead     Hospital Course: Hermela Needleman is a 77 y.o. who was admitted to [facility]. They were brought to the operating room on 07/18/2022 and underwent Procedure(s): TOTAL KNEE ARTHROPLASTY.  Patient tolerated the procedure well and was later transferred to the recovery room and then to the orthopaedic floor for postoperative care. They were given PO and IV analgesics for pain control following their surgery. They were given 24 hours of postoperative antibiotics of  Anti-infectives (From admission, onward)    Start     Dose/Rate Route Frequency Ordered Stop   07/18/22 1300  ceFAZolin (ANCEF) IVPB 2g/100 mL premix        2 g 200 mL/hr over 30 Minutes Intravenous Every 6 hours 07/18/22 1020 07/18/22 1903    07/18/22 0600  ceFAZolin (ANCEF) IVPB 2g/100 mL premix        2 g 200 mL/hr over 30 Minutes Intravenous On call to O.R. 07/18/22 [id number] 07/18/22 0729      and started on DVT prophylaxis in the form of  Eliquis .   PT and OT were ordered for total joint protocol. Discharge planning consulted to help with postop disposition and equipment needs.  Patient had a good night on the evening of surgery. They started to get up OOB with therapy on POD #0. Pt  was seen during rounds and was ready to go home pending progress with therapy. She worked with therapy on POD #1 and was meeting her goals. Pt was discharged to home later that day in stable condition.  Diet: Regular diet Activity: WBAT Follow-up: in 2 weeks Disposition: Home Discharged Condition: stable   Discharge Instructions     Call MD / Call 911   Complete by: As directed    If you experience chest pain or shortness of breath, CALL 911 and be transported to the hospital emergency room.  If you develope a fever above 101 F, pus (white drainage) or increased drainage or redness at the wound, or calf pain, call your surgeon's office.   Change dressing   Complete by: As directed    You may remove the bulky bandage (ACE wrap and gauze) two days after surgery. You will have an adhesive waterproof bandage underneath. Leave this in place until your first follow-up appointment.   Constipation Prevention   Complete by: As directed    Drink plenty of fluids.  Prune juice may be helpful.  You may use a stool softener, such as Colace (over the counter) 100 mg twice a day.  Use MiraLax (over the counter) for constipation as needed.   Diet - low sodium heart healthy   Complete by: As directed    Do not put a pillow under the knee. Place it under the heel.   Complete by: As directed    Driving restrictions   Complete by: As directed    No driving for two weeks   Post-operative opioid taper instructions:   Complete by: As directed    POST-OPERATIVE  OPIOID TAPER INSTRUCTIONS: It is important to wean off of your opioid medication as soon as possible. If you do not need pain medication after your surgery it is ok to stop day one. Opioids include: Codeine, Hydrocodone(Norco, Vicodin), Oxycodone(Percocet, oxycontin) and hydromorphone amongst others.  Long term and even short term use of opiods can cause: Increased pain response Dependence Constipation Depression Respiratory depression And more.  Withdrawal symptoms can include Flu like symptoms Nausea, vomiting And more Techniques to manage these symptoms Hydrate well Eat regular healthy meals Stay active Use relaxation techniques(deep breathing, meditating, yoga) Do Not substitute Alcohol to help with tapering If you have been on opioids for less than two weeks and do not have pain than it is ok to stop all together.  Plan to wean off of opioids This plan should start within one week post op of your joint replacement. Maintain the same interval or time between taking each dose and first decrease the dose.  Cut the total daily intake of opioids by one tablet each day Next start to increase the time between doses. The last dose that should be eliminated is the evening dose.      TED hose   Complete by: As directed    Use stockings (TED hose) for three weeks on both leg(s).  You may remove them at night for sleeping.   Weight bearing as tolerated   Complete by: As directed       Allergies as of 07/19/2022   No Known Allergies      Medication List     TAKE these medications    buPROPion 150 MG 24 hr tablet Commonly known as: WELLBUTRIN XL Take 150 mg by mouth in the morning.   CALCIUM 600+D3 PO Take 1 tablet by mouth in the morning.   diltiazem 240 MG  24 hr capsule Commonly known as: CARDIZEM CD Take 1 capsule (240 mg total) by mouth daily.   Eliquis 5 MG Tabs tablet Generic drug: apixaban Take 5 mg by mouth 2 (two) times daily.   estradiol 0.1 MG/GM  vaginal cream Commonly known as: ESTRACE Place 1 Applicatorful vaginally every Monday, Wednesday, and Friday at 8 PM.   famotidine 20 MG tablet Commonly known as: PEPCID Take 20 mg by mouth at bedtime.   Farxiga 5 MG Tabs tablet Generic drug: dapagliflozin propanediol Take 5 mg by mouth in the morning.   ketoconazole 2 % cream Commonly known as: NIZORAL Apply 1 Application topically 2 (two) times daily as needed (irritation.).   methocarbamol 500 MG tablet Commonly known as: ROBAXIN Take 1 tablet (500 mg total) by mouth every 6 (six) hours as needed for muscle spasms.   metoprolol tartrate 25 MG tablet Commonly known as: LOPRESSOR Take 1 tablet (25 mg total) by mouth every 8 (eight) hours as needed (If heartrate is greater that 130).   montelukast 10 MG tablet Commonly known as: SINGULAIR Take 10 mg by mouth at bedtime.   Omega-3 Fish Oil 1200 MG Caps Take 1,200 mg by mouth every evening.   oxyCODONE 5 MG immediate release tablet Commonly known as: Oxy IR/ROXICODONE Take 1-2 tablets (5-10 mg total) by mouth every 6 (six) hours as needed for severe pain.   PRESERVISION AREDS 2 PO Take 1 tablet by mouth in the morning.   HAIR SKIN NAILS PO Take 1 tablet by mouth in the morning.   rosuvastatin 5 MG tablet Commonly known as: CRESTOR Take 5 mg by mouth every evening.   spironolactone-hydrochlorothiazide 25-25 MG tablet Commonly known as: ALDACTAZIDE Take 0.5 tablets by mouth in the morning.   traMADol 50 MG tablet Commonly known as: ULTRAM Take 1-2 tablets (50-100 mg total) by mouth every 6 (six) hours as needed for moderate pain.               Discharge Care Instructions  (From admission, onward)           Start     Ordered   07/19/22 0000  Weight bearing as tolerated        07/19/22 0800   07/19/22 0000  Change dressing       Comments: You may remove the bulky bandage (ACE wrap and gauze) two days after surgery. You will have an adhesive waterproof  bandage underneath. Leave this in place until your first follow-up appointment.   07/19/22 0800            Follow-up Information     Holley Dexter, PA-C. Go on 08/03/2022.   Specialty: Orthopedic Surgery Why: You are scheduled for a follow up appointment on 08-03-22 at 3:00 pm. Contact information: [address], [place] [address] [phone]                 Signed: R. Jaynie Bream, PA-C Orthopedic Surgery 07/19/2022, 12:59 PM

## 2022-07-19 NOTE — TOC Transition Note (Signed)
Transition of Care [facility]) - CM/SW Discharge Note  Patient Details  Name: Monica Anderson MRN: [id number] Date of Birth: 12/25/1945  Transition of [facility]) CM/SW Contact:  Sherie Don, LCSW Phone Number: 07/19/2022, 11:42 AM  Clinical Narrative: Patient is expected to discharge home after working with PT. CSW met with patient to confirm discharge plan. Patient will discharge home with OPPT at [facility]. Patient has a rolling walker and BSC at home, so there are no DME needs at this time. TOC signing off.    Final next level of care: OP Rehab Barriers to Discharge: No Barriers Identified  Patient Goals and CMS Choice Choice offered to / list presented to : NA  Discharge Plan and Services Additional resources added to the After Visit Summary for          DME Arranged: N/A DME Agency: NA HH Arranged: NA HH Agency: NA  Social Determinants of Health (SDOH) Interventions SDOH Screenings   Food Insecurity: No Food Insecurity (07/18/2022)  Housing: Low Risk  (07/18/2022)  Transportation Needs: No Transportation Needs (07/18/2022)  Utilities: Not At Risk (07/18/2022)  Tobacco Use: Low Risk  (07/18/2022)   Readmission Risk Interventions     No data to display

## 2022-07-19 NOTE — Progress Notes (Signed)
   Subjective: 1 Day Post-Op Procedure(s) (LRB): TOTAL KNEE ARTHROPLASTY (Right) Patient seen in rounds by Dr. Wynelle Link. Patient is well, and has had no acute complaints or problems. Denies SOB or chest pain. Denies calf pain. Patient reports pain as moderate. Worked with physical therapy yesterday and ambulated 20'. We will continue physical therapy today.  Objective: Vital signs in last 24 hours: Temp:  [97 F (36.1 C)-98.3 F (36.8 C)] 97.6 F (36.4 C) (02/13 0516) Pulse Rate:  [56-67] 65 (02/13 0516) Resp:  [11-20] 19 (02/13 0516) BP: (121-161)/(58-74) 149/64 (02/13 0516) SpO2:  [92 %-100 %] 98 % (02/13 0516)  Intake/Output from previous day:  Intake/Output Summary (Last 24 hours) at 07/19/2022 0756 Last data filed at 07/19/2022 3212 Gross per 24 hour  Intake 3137.3 ml  Output 1840 ml  Net 1297.3 ml     Intake/Output this shift: No intake/output data recorded.  Labs: Recent Labs    07/19/22 0334  HGB 12.8   Recent Labs    07/19/22 0334  WBC 10.9*  RBC 4.19  HCT 39.7  PLT 171   Recent Labs    07/19/22 0334  NA 139  K 4.2  CL 106  CO2 25  BUN 17  CREATININE 0.89  GLUCOSE 143*  CALCIUM 8.7*   No results for input(s): "LABPT", "INR" in the last 72 hours.  Exam: General - Patient is Alert and Oriented Extremity - Neurologically intact Neurovascular intact Sensation intact distally Dorsiflexion/Plantar flexion intact Dressing - dressing C/D/I Motor Function - intact, moving foot and toes well on exam.  Past Medical History:  Diagnosis Date   Anemia    Arthritis    Asymmetrical left sensorineural hearing loss 10/01/2019   Atrial fibrillation ([place]) 10/02/2019   Essential hypertension 10/03/2019   History of colon polyps    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, left ([place]) 01/17/2017   Meniere's disease 08/29/2019   with hearing loss in Lt ear   Mixed hyperlipidemia 10/03/2019   Multiple dysplastic nevi    Other  hypertrophic cardiomyopathy ([facility])    Pleural effusion     Assessment/Plan: 1 Day Post-Op Procedure(s) (LRB): TOTAL KNEE ARTHROPLASTY (Right) Principal Problem:   Osteoarthritis of right knee Active Problems:   OA (osteoarthritis) of knee  Estimated body mass index is 30.04 kg/m as calculated from the following:   Height as of this encounter: '5\' 7"'$  (1.702 m).   Weight as of this encounter: 87 kg. Advance diet Up with therapy D/C IV fluids  Patient's anticipated LOS is less than 2 midnights, meeting these requirements: - Lives within 1 hour of care - Has a competent adult at home to recover with post-op  - NO history of  - Chronic pain requiring opiods  - Diabetes  - Coronary Artery Disease  - Heart failure  - Heart attack  - Stroke  - DVT/VTE  - Respiratory Failure/COPD  - Renal failure  - Anemia  - Advanced Liver disease  DVT Prophylaxis -  Eliquis Weight bearing as tolerated.  Continue physical therapy. Expected discharge home today pending progress and if meeting patient goals. Scheduled for OPPT at [facility]. Follow-up in clinic in 2 weeks.  The PDMP database was reviewed today prior to any opioid medications being prescribed to this patient.  R. Jaynie Bream, PA-C Orthopedic Surgery [phone] 07/19/2022, 7:56 AM

## 2022-07-21 DIAGNOSIS — Z4733 Aftercare following explantation of knee joint prosthesis: Secondary | ICD-10-CM | POA: Diagnosis not present

## 2022-07-21 DIAGNOSIS — M1711 Unilateral primary osteoarthritis, right knee: Secondary | ICD-10-CM | POA: Diagnosis not present

## 2022-07-21 DIAGNOSIS — Z96651 Presence of right artificial knee joint: Secondary | ICD-10-CM | POA: Diagnosis not present

## 2022-07-21 DIAGNOSIS — M25561 Pain in right knee: Secondary | ICD-10-CM | POA: Diagnosis not present

## 2022-07-25 DIAGNOSIS — Z4733 Aftercare following explantation of knee joint prosthesis: Secondary | ICD-10-CM | POA: Diagnosis not present

## 2022-07-25 DIAGNOSIS — M25561 Pain in right knee: Secondary | ICD-10-CM | POA: Diagnosis not present

## 2022-07-25 DIAGNOSIS — M1711 Unilateral primary osteoarthritis, right knee: Secondary | ICD-10-CM | POA: Diagnosis not present

## 2022-07-25 DIAGNOSIS — Z96651 Presence of right artificial knee joint: Secondary | ICD-10-CM | POA: Diagnosis not present

## 2022-07-26 ENCOUNTER — Ambulatory Visit: Payer: Medicare Other | Admitting: Cardiology

## 2022-07-27 DIAGNOSIS — Z96651 Presence of right artificial knee joint: Secondary | ICD-10-CM | POA: Diagnosis not present

## 2022-07-27 DIAGNOSIS — Z4733 Aftercare following explantation of knee joint prosthesis: Secondary | ICD-10-CM | POA: Diagnosis not present

## 2022-07-27 DIAGNOSIS — M25561 Pain in right knee: Secondary | ICD-10-CM | POA: Diagnosis not present

## 2022-07-27 DIAGNOSIS — M1711 Unilateral primary osteoarthritis, right knee: Secondary | ICD-10-CM | POA: Diagnosis not present

## 2022-07-29 DIAGNOSIS — M1711 Unilateral primary osteoarthritis, right knee: Secondary | ICD-10-CM | POA: Diagnosis not present

## 2022-07-29 DIAGNOSIS — Z4733 Aftercare following explantation of knee joint prosthesis: Secondary | ICD-10-CM | POA: Diagnosis not present

## 2022-07-29 DIAGNOSIS — M25561 Pain in right knee: Secondary | ICD-10-CM | POA: Diagnosis not present

## 2022-07-29 DIAGNOSIS — Z96651 Presence of right artificial knee joint: Secondary | ICD-10-CM | POA: Diagnosis not present

## 2022-08-01 DIAGNOSIS — M1711 Unilateral primary osteoarthritis, right knee: Secondary | ICD-10-CM | POA: Diagnosis not present

## 2022-08-01 DIAGNOSIS — Z4733 Aftercare following explantation of knee joint prosthesis: Secondary | ICD-10-CM | POA: Diagnosis not present

## 2022-08-01 DIAGNOSIS — Z96651 Presence of right artificial knee joint: Secondary | ICD-10-CM | POA: Diagnosis not present

## 2022-08-01 DIAGNOSIS — M25561 Pain in right knee: Secondary | ICD-10-CM | POA: Diagnosis not present

## 2022-08-03 DIAGNOSIS — Z4733 Aftercare following explantation of knee joint prosthesis: Secondary | ICD-10-CM | POA: Diagnosis not present

## 2022-08-03 DIAGNOSIS — M25561 Pain in right knee: Secondary | ICD-10-CM | POA: Diagnosis not present

## 2022-08-03 DIAGNOSIS — Z96651 Presence of right artificial knee joint: Secondary | ICD-10-CM | POA: Diagnosis not present

## 2022-08-03 DIAGNOSIS — M1711 Unilateral primary osteoarthritis, right knee: Secondary | ICD-10-CM | POA: Diagnosis not present

## 2022-08-05 DIAGNOSIS — M25561 Pain in right knee: Secondary | ICD-10-CM | POA: Diagnosis not present

## 2022-08-05 DIAGNOSIS — Z96651 Presence of right artificial knee joint: Secondary | ICD-10-CM | POA: Diagnosis not present

## 2022-08-05 DIAGNOSIS — M1711 Unilateral primary osteoarthritis, right knee: Secondary | ICD-10-CM | POA: Diagnosis not present

## 2022-08-05 DIAGNOSIS — Z4733 Aftercare following explantation of knee joint prosthesis: Secondary | ICD-10-CM | POA: Diagnosis not present

## 2022-08-08 DIAGNOSIS — Z4733 Aftercare following explantation of knee joint prosthesis: Secondary | ICD-10-CM | POA: Diagnosis not present

## 2022-08-08 DIAGNOSIS — M25561 Pain in right knee: Secondary | ICD-10-CM | POA: Diagnosis not present

## 2022-08-08 DIAGNOSIS — M1711 Unilateral primary osteoarthritis, right knee: Secondary | ICD-10-CM | POA: Diagnosis not present

## 2022-08-08 DIAGNOSIS — Z96651 Presence of right artificial knee joint: Secondary | ICD-10-CM | POA: Diagnosis not present

## 2022-08-10 DIAGNOSIS — Z4733 Aftercare following explantation of knee joint prosthesis: Secondary | ICD-10-CM | POA: Diagnosis not present

## 2022-08-10 DIAGNOSIS — N3001 Acute cystitis with hematuria: Secondary | ICD-10-CM | POA: Diagnosis not present

## 2022-08-10 DIAGNOSIS — Z6830 Body mass index (BMI) 30.0-30.9, adult: Secondary | ICD-10-CM | POA: Diagnosis not present

## 2022-08-10 DIAGNOSIS — M1711 Unilateral primary osteoarthritis, right knee: Secondary | ICD-10-CM | POA: Diagnosis not present

## 2022-08-10 DIAGNOSIS — Z96651 Presence of right artificial knee joint: Secondary | ICD-10-CM | POA: Diagnosis not present

## 2022-08-10 DIAGNOSIS — M25561 Pain in right knee: Secondary | ICD-10-CM | POA: Diagnosis not present

## 2022-08-12 DIAGNOSIS — M1711 Unilateral primary osteoarthritis, right knee: Secondary | ICD-10-CM | POA: Diagnosis not present

## 2022-08-12 DIAGNOSIS — Z4733 Aftercare following explantation of knee joint prosthesis: Secondary | ICD-10-CM | POA: Diagnosis not present

## 2022-08-12 DIAGNOSIS — Z96651 Presence of right artificial knee joint: Secondary | ICD-10-CM | POA: Diagnosis not present

## 2022-08-12 DIAGNOSIS — M25561 Pain in right knee: Secondary | ICD-10-CM | POA: Diagnosis not present

## 2022-08-15 DIAGNOSIS — M25561 Pain in right knee: Secondary | ICD-10-CM | POA: Diagnosis not present

## 2022-08-15 DIAGNOSIS — Z4733 Aftercare following explantation of knee joint prosthesis: Secondary | ICD-10-CM | POA: Diagnosis not present

## 2022-08-15 DIAGNOSIS — M1711 Unilateral primary osteoarthritis, right knee: Secondary | ICD-10-CM | POA: Diagnosis not present

## 2022-08-15 DIAGNOSIS — Z96651 Presence of right artificial knee joint: Secondary | ICD-10-CM | POA: Diagnosis not present

## 2022-08-18 DIAGNOSIS — Z96651 Presence of right artificial knee joint: Secondary | ICD-10-CM | POA: Diagnosis not present

## 2022-08-18 DIAGNOSIS — M1711 Unilateral primary osteoarthritis, right knee: Secondary | ICD-10-CM | POA: Diagnosis not present

## 2022-08-18 DIAGNOSIS — M25561 Pain in right knee: Secondary | ICD-10-CM | POA: Diagnosis not present

## 2022-08-18 DIAGNOSIS — Z4733 Aftercare following explantation of knee joint prosthesis: Secondary | ICD-10-CM | POA: Diagnosis not present

## 2022-08-22 DIAGNOSIS — Z4733 Aftercare following explantation of knee joint prosthesis: Secondary | ICD-10-CM | POA: Diagnosis not present

## 2022-08-22 DIAGNOSIS — M1711 Unilateral primary osteoarthritis, right knee: Secondary | ICD-10-CM | POA: Diagnosis not present

## 2022-08-22 DIAGNOSIS — M25561 Pain in right knee: Secondary | ICD-10-CM | POA: Diagnosis not present

## 2022-08-22 DIAGNOSIS — Z96651 Presence of right artificial knee joint: Secondary | ICD-10-CM | POA: Diagnosis not present

## 2022-08-23 DIAGNOSIS — Z5189 Encounter for other specified aftercare: Secondary | ICD-10-CM | POA: Diagnosis not present

## 2022-08-24 DIAGNOSIS — M25561 Pain in right knee: Secondary | ICD-10-CM | POA: Diagnosis not present

## 2022-08-24 DIAGNOSIS — M1711 Unilateral primary osteoarthritis, right knee: Secondary | ICD-10-CM | POA: Diagnosis not present

## 2022-08-24 DIAGNOSIS — Z4733 Aftercare following explantation of knee joint prosthesis: Secondary | ICD-10-CM | POA: Diagnosis not present

## 2022-08-24 DIAGNOSIS — Z96651 Presence of right artificial knee joint: Secondary | ICD-10-CM | POA: Diagnosis not present

## 2022-08-29 DIAGNOSIS — M1711 Unilateral primary osteoarthritis, right knee: Secondary | ICD-10-CM | POA: Diagnosis not present

## 2022-08-29 DIAGNOSIS — Z4733 Aftercare following explantation of knee joint prosthesis: Secondary | ICD-10-CM | POA: Diagnosis not present

## 2022-08-29 DIAGNOSIS — Z1231 Encounter for screening mammogram for malignant neoplasm of breast: Secondary | ICD-10-CM | POA: Diagnosis not present

## 2022-08-29 DIAGNOSIS — Z96651 Presence of right artificial knee joint: Secondary | ICD-10-CM | POA: Diagnosis not present

## 2022-08-29 DIAGNOSIS — M25561 Pain in right knee: Secondary | ICD-10-CM | POA: Diagnosis not present

## 2022-09-01 DIAGNOSIS — M1711 Unilateral primary osteoarthritis, right knee: Secondary | ICD-10-CM | POA: Diagnosis not present

## 2022-09-01 DIAGNOSIS — M25561 Pain in right knee: Secondary | ICD-10-CM | POA: Diagnosis not present

## 2022-09-01 DIAGNOSIS — Z96651 Presence of right artificial knee joint: Secondary | ICD-10-CM | POA: Diagnosis not present

## 2022-09-01 DIAGNOSIS — Z4733 Aftercare following explantation of knee joint prosthesis: Secondary | ICD-10-CM | POA: Diagnosis not present

## 2022-09-05 DIAGNOSIS — M1711 Unilateral primary osteoarthritis, right knee: Secondary | ICD-10-CM | POA: Diagnosis not present

## 2022-09-05 DIAGNOSIS — Z4733 Aftercare following explantation of knee joint prosthesis: Secondary | ICD-10-CM | POA: Diagnosis not present

## 2022-09-05 DIAGNOSIS — M25561 Pain in right knee: Secondary | ICD-10-CM | POA: Diagnosis not present

## 2022-09-05 DIAGNOSIS — Z96651 Presence of right artificial knee joint: Secondary | ICD-10-CM | POA: Diagnosis not present

## 2022-09-08 DIAGNOSIS — Z4733 Aftercare following explantation of knee joint prosthesis: Secondary | ICD-10-CM | POA: Diagnosis not present

## 2022-09-08 DIAGNOSIS — M25561 Pain in right knee: Secondary | ICD-10-CM | POA: Diagnosis not present

## 2022-09-08 DIAGNOSIS — Z96651 Presence of right artificial knee joint: Secondary | ICD-10-CM | POA: Diagnosis not present

## 2022-09-08 DIAGNOSIS — M1711 Unilateral primary osteoarthritis, right knee: Secondary | ICD-10-CM | POA: Diagnosis not present

## 2022-09-12 DIAGNOSIS — Z4733 Aftercare following explantation of knee joint prosthesis: Secondary | ICD-10-CM | POA: Diagnosis not present

## 2022-09-12 DIAGNOSIS — M25561 Pain in right knee: Secondary | ICD-10-CM | POA: Diagnosis not present

## 2022-09-12 DIAGNOSIS — Z96651 Presence of right artificial knee joint: Secondary | ICD-10-CM | POA: Diagnosis not present

## 2022-09-12 DIAGNOSIS — M1711 Unilateral primary osteoarthritis, right knee: Secondary | ICD-10-CM | POA: Diagnosis not present

## 2022-09-14 DIAGNOSIS — N3946 Mixed incontinence: Secondary | ICD-10-CM | POA: Diagnosis not present

## 2022-10-04 DIAGNOSIS — M1712 Unilateral primary osteoarthritis, left knee: Secondary | ICD-10-CM | POA: Diagnosis not present

## 2022-10-04 DIAGNOSIS — Z5189 Encounter for other specified aftercare: Secondary | ICD-10-CM | POA: Diagnosis not present

## 2022-10-05 DIAGNOSIS — N1832 Chronic kidney disease, stage 3b: Secondary | ICD-10-CM | POA: Diagnosis not present

## 2022-10-05 DIAGNOSIS — I480 Paroxysmal atrial fibrillation: Secondary | ICD-10-CM | POA: Diagnosis not present

## 2022-10-05 DIAGNOSIS — R7303 Prediabetes: Secondary | ICD-10-CM | POA: Diagnosis not present

## 2022-10-05 DIAGNOSIS — I422 Other hypertrophic cardiomyopathy: Secondary | ICD-10-CM | POA: Diagnosis not present

## 2022-11-04 ENCOUNTER — Ambulatory Visit: Payer: Medicare Other | Attending: Cardiology | Admitting: Cardiology

## 2022-11-04 ENCOUNTER — Encounter: Payer: Self-pay | Admitting: Cardiology

## 2022-11-04 VITALS — BP 160/90 | HR 66 | Ht 66.0 in | Wt 201.0 lb

## 2022-11-04 DIAGNOSIS — I422 Other hypertrophic cardiomyopathy: Secondary | ICD-10-CM | POA: Diagnosis not present

## 2022-11-04 DIAGNOSIS — I10 Essential (primary) hypertension: Secondary | ICD-10-CM | POA: Diagnosis not present

## 2022-11-04 DIAGNOSIS — I480 Paroxysmal atrial fibrillation: Secondary | ICD-10-CM | POA: Insufficient documentation

## 2022-11-04 DIAGNOSIS — I4729 Other ventricular tachycardia: Secondary | ICD-10-CM

## 2022-11-04 NOTE — Progress Notes (Unsigned)
Pounding sensation.  It lasted in the heart with was elevated she therefore decided to go to the emergency department at [facility]. Cardiology Office Note:    Date:  11/05/2022   ID:  Monica Anderson, DOB [birth year], MRN [id number]  PCP:  Monica Malta, MD  Cardiologist:  Monica Ripple, DO  Electrophysiologist:  None   Referring MD: Monica Malta, MD   No chief complaint on file.  From my heart I am doing very well but recently have had some ataxia  History of Present Illness:    Monica Anderson is a 77 y.o. female with a hx of  Apical variant of hypertrophic cardiomyopath ( on echo and confirmed on Cardiac MR), Paroxysmal atrial fibrillation diagnosed in 2015 on Cardizem and Eliquis, hypertension, hyperlipidemia, Mnire's disease, recently diagnosed BPPV.   Her last visit with me was 03/2022 at that time she was doing weel from a CV standpoint.   Since her visit with me she was seen by Anice Paganini, NP at that time she was cleared for her procedure- she underwent a right total knee arthroplasty.  She offers no complaints today.   Past Medical History:  Diagnosis Date   Anemia    Arthritis    Asymmetrical left sensorineural hearing loss 10/01/2019   Atrial fibrillation (HCC) 10/02/2019   Essential hypertension 10/03/2019   History of colon polyps    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, left (HCC) 01/17/2017   Meniere's disease 08/29/2019   with hearing loss in Lt ear   Mixed hyperlipidemia 10/03/2019   Multiple dysplastic nevi    Other hypertrophic cardiomyopathy (HCC)    Pleural effusion     Past Surgical History:  Procedure Laterality Date   APPENDECTOMY  1958   BREAST BIOPSY  1973   CATARACT EXTRACTION, BILATERAL  2019   COLONOSCOPY  11/07/2016   Colonic polyp status post polypectomy. Minimal sigmoid diverticulosis   DILATION AND CURETTAGE OF UTERUS  1968   MOHS SURGERY Left 2019   Melanoma on left ear   TOTAL KNEE ARTHROPLASTY  Right 07/18/2022   Procedure: TOTAL KNEE ARTHROPLASTY;  Surgeon: Monica Gross, MD;  Location: [facility];  Service: Orthopedics;  Laterality: Right;   TUBAL LIGATION  1977    Current Medications: Current Meds  Medication Sig   buPROPion (WELLBUTRIN XL) 150 MG 24 hr tablet Take 150 mg by mouth in the morning.   Calcium Carb-Cholecalciferol (CALCIUM 600+D3 PO) Take 1 tablet by mouth in the morning.   diltiazem (CARDIZEM CD) 240 MG 24 hr capsule Take 1 capsule (240 mg total) by mouth daily.   ELIQUIS 5 MG TABS tablet Take 5 mg by mouth 2 (two) times daily.   estradiol (ESTRACE) 0.1 MG/GM vaginal cream Place 1 Applicatorful vaginally every Monday, Wednesday, and Friday at 8 PM.   famotidine (PEPCID) 20 MG tablet Take 20 mg by mouth at bedtime.   FARXIGA 5 MG TABS tablet Take 5 mg by mouth in the morning.   ketoconazole (NIZORAL) 2 % cream Apply 1 Application topically 2 (two) times daily as needed (irritation.).   metoprolol tartrate (LOPRESSOR) 25 MG tablet Take 1 tablet (25 mg total) by mouth every 8 (eight) hours as needed (If heartrate is greater that 130).   montelukast (SINGULAIR) 10 MG tablet Take 10 mg by mouth at bedtime.   Multiple Vitamins-Minerals (PRESERVISION AREDS 2 PO) Take 1 tablet by mouth in the morning.   Omega-3 Fatty Acids (OMEGA-3 FISH OIL) 1200 MG CAPS Take  1,200 mg by mouth every evening.   rosuvastatin (CRESTOR) 5 MG tablet Take 5 mg by mouth every evening.   spironolactone-hydrochlorothiazide (ALDACTAZIDE) 25-25 MG tablet Take 0.5 tablets by mouth in the morning.   Current Facility-Administered Medications for the 11/04/22 encounter (Office Visit) with Monica Ripple, DO  Medication   triamcinolone acetonide (KENALOG-40) injection 20 mg     Allergies:   Patient has no known allergies.   Social History   Socioeconomic History   Marital status: Married    Spouse name: Not on file   Number of children: 3   Years of education: Not on file   Highest education level:  Not on file  Occupational History   Occupation: retired  Tobacco Use   Smoking status: Never   Smokeless tobacco: Never  Vaping Use   Vaping Use: Never used  Substance and Sexual Activity   Alcohol use: Yes    Comment: Wine once every 2 months   Drug use: Never   Sexual activity: Not on file  Other Topics Concern   Not on file  Social History Narrative   Right handed    caffeine: 1 cup of decaf in the AM   Lives with husband and one son   Social Determinants of Health   Financial Resource Strain: Not on file  Food Insecurity: No Food Insecurity (07/18/2022)   Hunger Vital Sign    Worried About Running Out of Food in the Last Year: Never true    Ran Out of Food in the Last Year: Never true  Transportation Needs: No Transportation Needs (07/18/2022)   PRAPARE - Administrator, Civil Service (Medical): No    Lack of Transportation (Non-Medical): No  Physical Activity: Not on file  Stress: Not on file  Social Connections: Not on file     Family History: The patient's family history includes Atrial fibrillation in her mother; Diabetes in her father; Hypertension in her father and mother; Stroke in her father. There is no history of Colon cancer, Liver cancer, Stomach cancer, Rectal cancer, or Esophageal cancer.  ROS:   Review of Systems  Constitution: Negative for decreased appetite, fever and weight gain.  HENT: Negative for congestion, ear discharge, hoarse voice and sore throat.   Eyes: Negative for discharge, redness, vision loss in right eye and visual halos.  Cardiovascular: Negative for chest pain, dyspnea on exertion, leg swelling, orthopnea and palpitations.  Respiratory: Negative for cough, hemoptysis, shortness of breath and snoring.   Endocrine: Negative for heat intolerance and polyphagia.  Hematologic/Lymphatic: Negative for bleeding problem. Does not bruise/bleed easily.  Skin: Negative for flushing, nail changes, rash and suspicious lesions.   Musculoskeletal: Negative for arthritis, joint pain, muscle cramps, myalgias, neck pain and stiffness.  Gastrointestinal: Negative for abdominal pain, bowel incontinence, diarrhea and excessive appetite.  Genitourinary: Negative for decreased libido, genital sores and incomplete emptying.  Neurological: Negative for brief paralysis, focal weakness, headaches and loss of balance.  Psychiatric/Behavioral: Negative for altered mental status, depression and suicidal ideas.  Allergic/Immunologic: Negative for HIV exposure and persistent infections.    EKGs/Labs/Other Studies Reviewed:    The following studies were reviewed today:   EKG: None today  Transthoracic echocardiogram This result has an attachment that is not available.  There is severe concentric left ventricular hypertrophy with septal and  posterior wall thickness ~ 1.5 cm.  Apical hypertrophy appears more predominant on contrast study.  No evidence of LVOT obstruction.  LVEF 65-70%  Mild tricuspid regurgitation  Left Ventricle  Left ventricle size is normal. There is severe concentric left ventricular hypertrophy with septal and posterior wall thickness ~ 1.5 cm. Apical hypertrophy appears more predominant on contrast study. Systolic function is normal. EF: 65-70%. Wall motion is normal. Doppler parameters consistent with mild diastolic dysfunction and low to normal LA pressure.   Right Ventricle  Right ventricle size is normal. Systolic function is normal. Normal tricuspid annular plane systolic excursion (TAPSE) >1.7 cm. Normal systolic excursion velocity by TDI (>9.5 cm/s).   Left Atrium  Left atrium size is normalLeft atrium volume index is normal (16-34 mL/m2).   Right Atrium  Right atrium size is normal.   IVC/SVC  The inferior vena cava demonstrates a diameter of <=2.1 cm and collapses >50%; therefore, the right atrial pressure is estimated at 3 mmHg.   Mitral Valve  Mitral valve structure is normal. There is  mild posterior annular calcification. There is trace regurgitation.   Tricuspid Valve  Tricuspid valve structure is normal. There is mild regurgitation. The right ventricular systolic pressure is normal (<36 mmHg).   Aortic Valve  The aortic valve is tricuspid. The leaflets are mildly thickened and exhibit normal excursion. The leaflets are mildly calcified. There is no regurgitation or stenosis.   Pulmonic Valve  The pulmonic valve was not well visualized. Trace regurgitation.   Ascending Aorta  The aortic root is normal in size. The ascending aorta is normal in size.   Pericardium  There is no pericardial effusion.   Study Details  A complete echo was performed using complete 2D, color flow Doppler and spectral Doppler. During the study the apical, parasternal, subcostal and suprasternal views were captured. Patient has normal sinus rhythm. Overall the study quality was fair. The study was technically difficult. The study was difficult due to patient's body habitus. The imaging is on file and stored in a permanent location.   Other studies Reviewed: Additional studies/ records that were reviewed today include: CMRI  Review of the above records today demonstrates:  1. LV apical hypertrophy measuring up to 15mm (basal posterior wall measures 7mm), consistent with apical hypertrophic cardiomyopathy 2.  No late gadolinium enhancement to suggest myocardial scar 3.  Normal LV size with hyperdynamic systolic function (EF 74%) 4.  Normal RV size and systolic function (EF 65%) 5. Small pericardial effusion measures up to 9mm adjacent to LV inferior wall   Coronary CT 03/10/20 1. Coronary calcium score of 3. This was 31st percentile for age and sex matched control.   2. Normal coronary origin with right dominance.   3. Nonobstructive CAD with calcified plaque in the mid LAD and mid D1 causing minimal (0-24%) stenosis   Cardiac monitor 02/11/2020 personally reviewed 4 Ventricular  Tachycardia runs occurred, the run with the fastest interval lasting 14.7 secs with a maximum rate of 152 bpm (average 113 bpm); the run with the fastest interval was also the longest.   36 Supraventricular Tachycardia runs occurred, the run with the fastest interval lasting 4 beats with a maximum rate of 158 bpm, the longest lasting 14.3 secs with an average rate of 109 bpm.  Recent Labs: 07/19/2022: BUN 17; Creatinine, Ser 0.89; Hemoglobin 12.8; Platelets 171; Potassium 4.2; Sodium 139  Recent Lipid Panel No results found for: "CHOL", "TRIG", "HDL", "CHOLHDL", "VLDL", "LDLCALC", "LDLDIRECT"  Physical Exam:    VS:  BP (!) 160/90   Pulse 66   Ht 5\' 6"  (1.676 m)   Wt 201 lb (91.2 kg)   LMP  (LMP  Unknown)   SpO2 97%   BMI 32.44 kg/m     Wt Readings from Last 3 Encounters:  11/04/22 201 lb (91.2 kg)  07/18/22 191 lb 12.8 oz (87 kg)  07/08/22 194 lb (88 kg)     GEN: Well nourished, well developed in no acute distress HEENT: Normal NECK: No JVD; No carotid bruits LYMPHATICS: No lymphadenopathy CARDIAC: S1S2 noted,RRR, no murmurs, rubs, gallops RESPIRATORY:  Clear to auscultation without rales, wheezing or rhonchi  ABDOMEN: Soft, non-tender, non-distended, +bowel sounds, no guarding. EXTREMITIES: No edema, No cyanosis, no clubbing MUSCULOSKELETAL:  No deformity  SKIN: Warm and dry NEUROLOGIC:  Alert and oriented x 3, non-focal PSYCHIATRIC:  Normal affect, good insight  ASSESSMENT:    1. Paroxysmal atrial fibrillation (HCC)   2. Essential hypertension   3. Apical variant hypertrophic cardiomyopathy (HCC)   4. Nonsustained ventricular tachycardia (HCC)     PLAN:    Blood pressure is elevated the office She tells me that her blood pressure has been controlled So we decided that she will take her blood pressure daily and send this to me in about a week should she be elevated we will increase her antihypertensive regimen.  In sinus rhythm, continue Cardizem as well as her  Eliquis.  Hyperlipidemia - continue with current statin medication.  The patient is in agreement with the above plan. The patient left the office in stable condition.  The patient will follow up in 4 months.   Medication Adjustments/Labs and Tests Ordered: Current medicines are reviewed at length with the patient today.  Concerns regarding medicines are outlined above.  No orders of the defined types were placed in this encounter.   No orders of the defined types were placed in this encounter.    Patient Instructions  Medication Instructions:  Your physician recommends that you continue on your current medications as directed. Please refer to the Current Medication list given to you today.   Please take your blood pressure daily for 2 weeks and send in a MyChart message. Please include heart rates.   HOW TO TAKE YOUR BLOOD PRESSURE: Rest 5 minutes before taking your blood pressure. Don't smoke or drink caffeinated beverages for at least 30 minutes before. Take your blood pressure before (not after) you eat. Sit comfortably with your back supported and both feet on the floor (don't cross your legs). Elevate your arm to heart level on a table or a desk. Use the proper sized cuff. It should fit smoothly and snugly around your bare upper arm. There should be enough room to slip a fingertip under the cuff. The bottom edge of the cuff should be 1 inch above the crease of the elbow. Ideally, take 3 measurements at one sitting and record the average.  *If you need a refill on your cardiac medications before your next appointment, please call your pharmacy*   Lab Work: None   Testing/Procedures: None   Follow-Up: At [facility], you and your health needs are our priority.  As part of our continuing mission to provide you with exceptional heart care, we have created designated Provider Care Teams.  These Care Teams include your primary Cardiologist (physician) and Advanced  Practice Providers (APPs -  Physician Assistants and Nurse Practitioners) who all work together to provide you with the care you need, when you need it.   Your next appointment:   9 month(s)  Provider:   Thomasene Ripple, DO     Adopting a Healthy Lifestyle.  Know  what a healthy weight is for you (roughly BMI <25) and aim to maintain this   Aim for 7+ servings of fruits and vegetables daily   65-80+ fluid ounces of water or unsweet tea for healthy kidneys   Limit to max 1 drink of alcohol per day; avoid smoking/tobacco   Limit animal fats in diet for cholesterol and heart health - choose grass fed whenever available   Avoid highly processed foods, and foods high in saturated/trans fats   Aim for low stress - take time to unwind and care for your mental health   Aim for 150 min of moderate intensity exercise weekly for heart health, and weights twice weekly for bone health   Aim for 7-9 hours of sleep daily   When it comes to diets, agreement about the perfect plan isnt easy to find, even among the experts. Experts at the [employer] developed an idea known as the Healthy Eating Plate. Just imagine a plate divided into logical, healthy portions.   The emphasis is on diet quality:   Load up on vegetables and fruits - one-half of your plate: Aim for color and variety, and remember that potatoes dont count.   Go for whole grains - one-quarter of your plate: Whole wheat, barley, wheat berries, quinoa, oats, brown rice, and foods made with them. If you want pasta, go with whole wheat pasta.   Protein power - one-quarter of your plate: Fish, chicken, beans, and nuts are all healthy, versatile protein sources. Limit red meat.   The diet, however, does go beyond the plate, offering a few other suggestions.   Use healthy plant oils, such as olive, canola, soy, corn, sunflower and peanut. Check the labels, and avoid partially hydrogenated oil, which have unhealthy  trans fats.   If youre thirsty, drink water. Coffee and tea are good in moderation, but skip sugary drinks and limit milk and dairy products to one or two daily servings.   The type of carbohydrate in the diet is more important than the amount. Some sources of carbohydrates, such as vegetables, fruits, whole grains, and beans-are healthier than others.   Finally, stay active  Signed, Monica Ripple, DO  11/05/2022 9:49 PM    [facility]

## 2022-11-04 NOTE — Patient Instructions (Signed)
Medication Instructions:  Your physician recommends that you continue on your current medications as directed. Please refer to the Current Medication list given to you today.   Please take your blood pressure daily for 2 weeks and send in a MyChart message. Please include heart rates.   HOW TO TAKE YOUR BLOOD PRESSURE: Rest 5 minutes before taking your blood pressure. Don't smoke or drink caffeinated beverages for at least 30 minutes before. Take your blood pressure before (not after) you eat. Sit comfortably with your back supported and both feet on the floor (don't cross your legs). Elevate your arm to heart level on a table or a desk. Use the proper sized cuff. It should fit smoothly and snugly around your bare upper arm. There should be enough room to slip a fingertip under the cuff. The bottom edge of the cuff should be 1 inch above the crease of the elbow. Ideally, take 3 measurements at one sitting and record the average.  *If you need a refill on your cardiac medications before your next appointment, please call your pharmacy*   Lab Work: None   Testing/Procedures: None   Follow-Up: At [facility], you and your health needs are our priority.  As part of our continuing mission to provide you with exceptional heart care, we have created designated Provider Care Teams.  These Care Teams include your primary Cardiologist (physician) and Advanced Practice Providers (APPs -  Physician Assistants and Nurse Practitioners) who all work together to provide you with the care you need, when you need it.   Your next appointment:   9 month(s)  Provider:   Thomasene Ripple, DO

## 2022-11-07 DIAGNOSIS — M79642 Pain in left hand: Secondary | ICD-10-CM | POA: Diagnosis not present

## 2022-11-14 DIAGNOSIS — M1812 Unilateral primary osteoarthritis of first carpometacarpal joint, left hand: Secondary | ICD-10-CM | POA: Diagnosis not present

## 2022-11-25 DIAGNOSIS — D0439 Carcinoma in situ of skin of other parts of face: Secondary | ICD-10-CM | POA: Diagnosis not present

## 2022-11-25 DIAGNOSIS — L821 Other seborrheic keratosis: Secondary | ICD-10-CM | POA: Diagnosis not present

## 2022-11-25 DIAGNOSIS — D492 Neoplasm of unspecified behavior of bone, soft tissue, and skin: Secondary | ICD-10-CM | POA: Diagnosis not present

## 2022-11-25 DIAGNOSIS — Z85828 Personal history of other malignant neoplasm of skin: Secondary | ICD-10-CM | POA: Diagnosis not present

## 2022-11-25 DIAGNOSIS — L7211 Pilar cyst: Secondary | ICD-10-CM | POA: Diagnosis not present

## 2022-11-25 DIAGNOSIS — D229 Melanocytic nevi, unspecified: Secondary | ICD-10-CM | POA: Diagnosis not present

## 2022-11-25 DIAGNOSIS — Z85820 Personal history of malignant melanoma of skin: Secondary | ICD-10-CM | POA: Diagnosis not present

## 2022-11-25 DIAGNOSIS — L814 Other melanin hyperpigmentation: Secondary | ICD-10-CM | POA: Diagnosis not present

## 2022-11-25 DIAGNOSIS — D2239 Melanocytic nevi of other parts of face: Secondary | ICD-10-CM | POA: Diagnosis not present

## 2022-11-25 DIAGNOSIS — Z87898 Personal history of other specified conditions: Secondary | ICD-10-CM | POA: Diagnosis not present

## 2022-11-29 ENCOUNTER — Encounter: Payer: Self-pay | Admitting: Cardiology

## 2022-11-29 MED ORDER — CARVEDILOL 6.25 MG PO TABS: 6.2500 mg | ORAL_TABLET | Freq: Two times a day (BID) | ORAL | 3 refills | Status: DC

## 2022-12-05 DIAGNOSIS — M25562 Pain in left knee: Secondary | ICD-10-CM | POA: Diagnosis not present

## 2023-01-27 DIAGNOSIS — M1712 Unilateral primary osteoarthritis, left knee: Secondary | ICD-10-CM | POA: Diagnosis not present

## 2023-01-27 DIAGNOSIS — Z96651 Presence of right artificial knee joint: Secondary | ICD-10-CM | POA: Diagnosis not present

## 2023-02-02 DIAGNOSIS — Z23 Encounter for immunization: Secondary | ICD-10-CM | POA: Diagnosis not present

## 2023-02-27 DIAGNOSIS — D0439 Carcinoma in situ of skin of other parts of face: Secondary | ICD-10-CM | POA: Diagnosis not present

## 2023-03-31 DIAGNOSIS — M1712 Unilateral primary osteoarthritis, left knee: Secondary | ICD-10-CM | POA: Diagnosis not present

## 2023-04-13 DIAGNOSIS — R350 Frequency of micturition: Secondary | ICD-10-CM | POA: Diagnosis not present

## 2023-05-10 DIAGNOSIS — H524 Presbyopia: Secondary | ICD-10-CM | POA: Diagnosis not present

## 2023-05-10 DIAGNOSIS — H52223 Regular astigmatism, bilateral: Secondary | ICD-10-CM | POA: Diagnosis not present

## 2023-05-10 DIAGNOSIS — H26493 Other secondary cataract, bilateral: Secondary | ICD-10-CM | POA: Diagnosis not present

## 2023-05-10 DIAGNOSIS — Z961 Presence of intraocular lens: Secondary | ICD-10-CM | POA: Diagnosis not present

## 2023-05-10 DIAGNOSIS — H5203 Hypermetropia, bilateral: Secondary | ICD-10-CM | POA: Diagnosis not present

## 2023-05-17 DIAGNOSIS — R350 Frequency of micturition: Secondary | ICD-10-CM | POA: Diagnosis not present

## 2023-06-02 DIAGNOSIS — M1712 Unilateral primary osteoarthritis, left knee: Secondary | ICD-10-CM | POA: Diagnosis not present

## 2023-06-03 ENCOUNTER — Other Ambulatory Visit: Payer: Self-pay | Admitting: Cardiology

## 2023-06-12 ENCOUNTER — Other Ambulatory Visit: Payer: Self-pay | Admitting: Pharmacist

## 2023-06-12 NOTE — Progress Notes (Signed)
 06/12/2023 Name: Monica Anderson MRN: [id number] DOB: 1945/11/23  Chief Complaint  Patient presents with   Medication Assistance    Monica Anderson is a 78 y.o. year old female who presented for a telephone visit.   They were referred to the pharmacist by  herself  for assistance in managing financial issues in obtaining Farxiga  through patient assistance because I reached out to her husband for re-enrollment for 2025 and she had issues affording it last year as well.  She does not have a [facility] banner but she and her husband have the same insurance and he has a electrical engineer.   Care Team: Primary Care Provider: Clemmie Nest, MD ;  Medication Access/Adherence  Current Pharmacy:  [facility] [address] [place] [address] [place] [address] Phone: [phone] Fax: [phone]   Patient reports affordability concerns with their medications: Yes  Patient reports access/transportation concerns to their pharmacy: No  Patient reports adherence concerns with their medications:  No     Objective:  No results found for: HGBA1C  Lab Results  Component Value Date   CREATININE 0.89 07/19/2022   BUN 17 07/19/2022   NA 139 07/19/2022   K 4.2 07/19/2022   CL 106 07/19/2022   CO2 25 07/19/2022    No results found for: CHOL, HDL, LDLCALC, LDLDIRECT, TRIG, CHOLHDL  Medications Reviewed Today     Reviewed by Jolee Cassius PARAS, [facility] (Pharmacist) on 06/12/23 at 1343  Med List Status: <None>   Medication Order Taking? Sig Documenting Provider Last Dose Status Informant  buPROPion  (WELLBUTRIN  XL) 150 MG 24 hr tablet [id number] Yes Take 150 mg by mouth in the morning. [provider] Taking Active Self  Calcium  Carb-Cholecalciferol (CALCIUM  600+D3 PO) [id number] Yes Take 1 tablet by mouth in the morning. [provider] Taking Active Self  carvedilol  (COREG ) 6.25 MG tablet [id number] Yes Take 1 tablet (6.25 mg total) by mouth 2  (two) times daily. Tobb, Kardie, DO Taking Active   Cholecalciferol (VITAMIN D3) 50 MCG (2000 UT) CAPS [id number] Yes Take 1 capsule by mouth daily. [provider] Taking Active   diltiazem  (CARDIZEM  CD) 240 MG 24 hr capsule [id number] Yes TAKE 1 CAPSULE BY MOUTH EVERY DAY Tobb, Kardie, DO Taking Active   ELIQUIS  5 MG TABS tablet [id number] Yes Take 5 mg by mouth 2 (two) times daily. [provider] Taking Active Self  estradiol (ESTRACE) 0.1 MG/GM vaginal cream [id number] Yes Place 1 Applicatorful vaginally every Monday, Wednesday, and Friday at 8 PM. [provider] Taking Active Self  famotidine  (PEPCID ) 20 MG tablet [id number] Yes Take 20 mg by mouth at bedtime. [provider] Taking Active Self  FARXIGA  5 MG TABS tablet [id number] Yes Take 5 mg by mouth in the morning. [provider] Taking Active Self  ketoconazole (NIZORAL) 2 % cream [id number] Yes Apply 1 Application topically 2 (two) times daily as needed (irritation.). [provider] Taking Active Self  montelukast  (SINGULAIR ) 10 MG tablet [id number] Yes Take 10 mg by mouth at bedtime. [provider] Taking Active Self  Multiple Vitamins-Minerals (PRESERVISION AREDS 2 PO) [id number] Yes Take 1 tablet by mouth in the morning. [provider] Taking Active Self  nitrofurantoin , macrocrystal-monohydrate, (MACROBID ) 100 MG capsule [id number] Yes Take 100 mg by mouth daily. [provider] Taking Active   Omega-3 Fatty Acids (OMEGA-3 FISH OIL) 1200 MG CAPS [id number] Yes Take 1,200 mg by mouth every evening. [provider] Taking Active  Self  rosuvastatin  (CRESTOR ) 5 MG tablet [id number] Yes Take 5 mg by mouth every evening. [provider] Taking Active Self  spironolactone -hydrochlorothiazide  (ALDACTAZIDE) 25-25 MG tablet [id number] Yes Take 0.5 tablets by mouth in the morning. [provider] Taking Active Self  triamcinolone  acetonide  (KENALOG -40) injection 20 mg [id number]   Stover, Titorya, DPM  Active             Findings:  Carvedilol  6.25 1 tablet twice daily (not on med list on PCP EMR)   Assessment/Plan:   Spoke with Patient about necessary financial documentation. From initial review, she appears to qualify to receive Farxiga  from [facility] [place].   Follow Up Plan: I will route patient assistance letter to [facility] pharmacy technician who will coordinate patient assistance program application process for medications listed above.  [facility] pharmacy technician will assist with obtaining all required documents from both patient and provider(s) and submit application(s) once completed.     Cassius DOROTHA Brought, PharmD, BCACP [facility] Clinical Pharmacist [phone]

## 2023-06-19 ENCOUNTER — Telehealth: Payer: Self-pay | Admitting: Pharmacy Technician

## 2023-06-19 DIAGNOSIS — Z5986 Financial insecurity: Secondary | ICD-10-CM

## 2023-06-19 NOTE — Progress Notes (Addendum)
 Pharmacy Medication Assistance Program Note    06/19/2023  Patient ID: Monica Anderson, female   DOB: 1945-07-19, 78 y.o.   MRN: [id number]     06/19/2023  Outreach Medication One  Initial Outreach Date (Medication One) 06/15/2023  Manufacturer Medication One [employer]  [employer] Farxiga   Dose of Farxiga  5mg   Type of Radiographer, Therapeutic Assistance  Date Application Sent to Patient 06/15/2023  Application Items Requested Application;Proof of Income;Other  Date Application Sent to Prescriber 06/15/2023  Name of Prescriber Delon Contes    [facility] 08/04/23 Successful outreach call to patient in regard to above named application. Patient informed she is not interested in applying. She informs since her husband is now getting his medications thru the [place] it has freed up some funds for her to purchase the Farxiga . Will close case and route note to [facility] PharmD for case closure.   Kate Caddy, CPhT [place]  Office: [phone] Fax: [phone] Email: [email or web address]

## 2023-06-22 DIAGNOSIS — M1712 Unilateral primary osteoarthritis, left knee: Secondary | ICD-10-CM | POA: Diagnosis not present

## 2023-07-06 DIAGNOSIS — L814 Other melanin hyperpigmentation: Secondary | ICD-10-CM | POA: Diagnosis not present

## 2023-07-06 DIAGNOSIS — Z79899 Other long term (current) drug therapy: Secondary | ICD-10-CM | POA: Diagnosis not present

## 2023-07-06 DIAGNOSIS — Z87898 Personal history of other specified conditions: Secondary | ICD-10-CM | POA: Diagnosis not present

## 2023-07-06 DIAGNOSIS — L989 Disorder of the skin and subcutaneous tissue, unspecified: Secondary | ICD-10-CM | POA: Diagnosis not present

## 2023-07-06 DIAGNOSIS — D229 Melanocytic nevi, unspecified: Secondary | ICD-10-CM | POA: Diagnosis not present

## 2023-07-06 DIAGNOSIS — D2221 Melanocytic nevi of right ear and external auricular canal: Secondary | ICD-10-CM | POA: Diagnosis not present

## 2023-07-06 DIAGNOSIS — Z85820 Personal history of malignant melanoma of skin: Secondary | ICD-10-CM | POA: Diagnosis not present

## 2023-07-06 DIAGNOSIS — L570 Actinic keratosis: Secondary | ICD-10-CM | POA: Diagnosis not present

## 2023-07-06 DIAGNOSIS — D492 Neoplasm of unspecified behavior of bone, soft tissue, and skin: Secondary | ICD-10-CM | POA: Diagnosis not present

## 2023-07-06 DIAGNOSIS — L821 Other seborrheic keratosis: Secondary | ICD-10-CM | POA: Diagnosis not present

## 2023-07-06 DIAGNOSIS — D0439 Carcinoma in situ of skin of other parts of face: Secondary | ICD-10-CM | POA: Diagnosis not present

## 2023-07-06 DIAGNOSIS — Z85828 Personal history of other malignant neoplasm of skin: Secondary | ICD-10-CM | POA: Diagnosis not present

## 2023-07-06 DIAGNOSIS — L7211 Pilar cyst: Secondary | ICD-10-CM | POA: Diagnosis not present

## 2023-07-07 ENCOUNTER — Other Ambulatory Visit: Payer: Self-pay

## 2023-07-07 ENCOUNTER — Encounter (HOSPITAL_BASED_OUTPATIENT_CLINIC_OR_DEPARTMENT_OTHER): Payer: Self-pay

## 2023-07-07 ENCOUNTER — Ambulatory Visit (HOSPITAL_BASED_OUTPATIENT_CLINIC_OR_DEPARTMENT_OTHER)
Admission: EM | Admit: 2023-07-07 | Discharge: 2023-07-07 | Disposition: A | Discharge status: Home / Self Care | Payer: Medicare Other | Attending: Family Medicine | Admitting: Family Medicine

## 2023-07-07 DIAGNOSIS — I422 Other hypertrophic cardiomyopathy: Secondary | ICD-10-CM | POA: Diagnosis not present

## 2023-07-07 DIAGNOSIS — R0602 Shortness of breath: Secondary | ICD-10-CM | POA: Diagnosis not present

## 2023-07-07 DIAGNOSIS — I4891 Unspecified atrial fibrillation: Secondary | ICD-10-CM | POA: Diagnosis not present

## 2023-07-07 DIAGNOSIS — I480 Paroxysmal atrial fibrillation: Secondary | ICD-10-CM | POA: Diagnosis not present

## 2023-07-07 DIAGNOSIS — I10 Essential (primary) hypertension: Secondary | ICD-10-CM | POA: Diagnosis not present

## 2023-07-07 DIAGNOSIS — E669 Obesity, unspecified: Secondary | ICD-10-CM | POA: Diagnosis not present

## 2023-07-07 DIAGNOSIS — Z7901 Long term (current) use of anticoagulants: Secondary | ICD-10-CM | POA: Diagnosis not present

## 2023-07-07 DIAGNOSIS — R0789 Other chest pain: Secondary | ICD-10-CM | POA: Diagnosis not present

## 2023-07-07 DIAGNOSIS — R079 Chest pain, unspecified: Secondary | ICD-10-CM | POA: Diagnosis not present

## 2023-07-07 DIAGNOSIS — R531 Weakness: Secondary | ICD-10-CM | POA: Diagnosis not present

## 2023-07-07 DIAGNOSIS — R9431 Abnormal electrocardiogram [ECG] [EKG]: Secondary | ICD-10-CM | POA: Diagnosis not present

## 2023-07-07 DIAGNOSIS — Z79899 Other long term (current) drug therapy: Secondary | ICD-10-CM | POA: Diagnosis not present

## 2023-07-07 NOTE — ED Provider Notes (Signed)
[facility]    CSN: [id number] Arrival date & time: 07/07/23  1437      History   Chief Complaint Chief Complaint  Patient presents with   Shortness of Breath    HPI Monica Anderson is a 78 y.o. female.    Shortness of Breath  Here for shortness of breath and heaviness in her chest. This began about 3-4 hours prior, when she had brought in her groceries from the car. No chest pain, just a heaviness. No dizziness.  Has a h/o Afib.  No recent cough or congestion  Past Medical History:  Diagnosis Date   Anemia    Arthritis    Asymmetrical left sensorineural hearing loss 10/01/2019   Atrial fibrillation (HCC) 10/02/2019   Essential hypertension 10/03/2019   History of colon polyps    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, left (HCC) 01/17/2017   Meniere's disease 08/29/2019   with hearing loss in Lt ear   Mixed hyperlipidemia 10/03/2019   Multiple dysplastic nevi    Other hypertrophic cardiomyopathy (HCC)    Pleural effusion     Patient Active Problem List   Diagnosis Date Noted   OA (osteoarthritis) of knee 07/18/2022   Osteoarthritis of right knee 05/11/2020   Osteoarthritis of left knee 03/18/2020   Nonspecific abnormal electrocardiogram (ECG) (EKG) 02/18/2020   Nonsustained ventricular tachycardia (HCC) 02/18/2020   Abnormal electrocardiogram 02/18/2020   PAT (paroxysmal atrial tachycardia) (HCC) 02/18/2020   Dizziness 01/07/2020   Apical variant hypertrophic cardiomyopathy (HCC) 01/07/2020   Acquired trigger finger of left middle finger 12/12/2019   Essential hypertension 10/03/2019   Mixed hyperlipidemia 10/03/2019   Atrial fibrillation (HCC) 10/02/2019   History of appendectomy 10/02/2019   Tubal ligation evaluation 10/02/2019   Asymmetrical left sensorineural hearing loss 10/01/2019   Meniere's disease 08/29/2019   History of malignant melanoma of skin 06/16/2017   Malignant melanoma of skin of ear and external  auditory canal, left (HCC) 01/17/2017   History of atypical nevus 12/14/2016    Past Surgical History:  Procedure Laterality Date   APPENDECTOMY  1958   BREAST BIOPSY  1973   CATARACT EXTRACTION, BILATERAL  2019   COLONOSCOPY  11/07/2016   Colonic polyp status post polypectomy. Minimal sigmoid diverticulosis   DILATION AND CURETTAGE OF UTERUS  1968   MOHS SURGERY Left 2019   Melanoma on left ear   TOTAL KNEE ARTHROPLASTY Right 07/18/2022   Procedure: TOTAL KNEE ARTHROPLASTY;  Surgeon: Ollen Gross, MD;  Location: [facility];  Service: Orthopedics;  Laterality: Right;   TUBAL LIGATION  1977    OB History   No obstetric history on file.      Home Medications    Prior to Admission medications   Medication Sig Start Date End Date Taking? Authorizing Provider  buPROPion (WELLBUTRIN XL) 150 MG 24 hr tablet Take 150 mg by mouth in the morning.    [provider]  Calcium Carb-Cholecalciferol (CALCIUM 600+D3 PO) Take 1 tablet by mouth in the morning.    [provider]  carvedilol (COREG) 6.25 MG tablet Take 1 tablet (6.25 mg total) by mouth 2 (two) times daily. 11/29/22   Tobb, Kardie, DO  Cholecalciferol (VITAMIN D3) 50 MCG (2000 UT) CAPS Take 1 capsule by mouth daily.    [provider]  diltiazem (CARDIZEM CD) 240 MG 24 hr capsule TAKE 1 CAPSULE BY MOUTH EVERY DAY 06/05/23   Tobb, Kardie, DO  ELIQUIS 5 MG TABS tablet Take 5  mg by mouth 2 (two) times daily. 01/16/18   [provider]  estradiol (ESTRACE) 0.1 MG/GM vaginal cream Place 1 Applicatorful vaginally every Monday, Wednesday, and Friday at 8 PM.    [provider]  famotidine (PEPCID) 20 MG tablet Take 20 mg by mouth at bedtime. 08/09/21   [provider]  FARXIGA 5 MG TABS tablet Take 5 mg by mouth in the morning. 03/02/22   [provider]  ketoconazole (NIZORAL) 2 % cream Apply 1 Application topically 2 (two) times daily as needed (irritation.).    [provider]  montelukast (SINGULAIR) 10 MG tablet Take 10 mg by mouth at bedtime. 08/09/21   [provider]  Multiple Vitamins-Minerals (PRESERVISION AREDS 2 PO) Take 1 tablet by mouth in the morning.    [provider]  nitrofurantoin, macrocrystal-monohydrate, (MACROBID) 100 MG capsule Take 100 mg by mouth daily. 05/14/23   [provider]  Omega-3 Fatty Acids (OMEGA-3 FISH OIL) 1200 MG CAPS Take 1,200 mg by mouth every evening.    [provider]  rosuvastatin (CRESTOR) 5 MG tablet Take 5 mg by mouth every evening. 12/10/17   [provider]  spironolactone-hydrochlorothiazide (ALDACTAZIDE) 25-25 MG tablet Take 0.5 tablets by mouth in the morning.    [provider]    Family History Family History  Problem Relation Age of Onset   Hypertension Mother    Atrial fibrillation Mother    Hypertension Father    Stroke Father    Diabetes Father    Colon cancer Neg Hx    Liver cancer Neg Hx    Stomach cancer Neg Hx    Rectal cancer Neg Hx    Esophageal cancer Neg Hx     Social History Social History   Tobacco Use   Smoking status: Never   Smokeless tobacco: Never  Vaping Use   Vaping status: Never Used  Substance Use Topics   Alcohol use: Yes    Comment: Wine once every 2 months   Drug use: Never     Allergies   Patient has no known allergies.   Review of Systems Review of Systems  Respiratory:  Positive for shortness of breath.      Physical Exam Triage Vital Signs ED Triage Vitals  Encounter Vitals Group     BP 07/07/23 1711 (!) 163/84     Systolic BP Percentile --      Diastolic BP Percentile --      Pulse Rate 07/07/23 1711 70     Resp 07/07/23 1711 20     Temp 07/07/23 1711 99.2 F (37.3 C)     Temp Source 07/07/23 1711 Oral     SpO2 07/07/23 1711 95 %     Weight --      Height --      Head Circumference --      Peak Flow --      Pain Score 07/07/23 1646 0     Pain Loc --      Pain Education --       Exclude from Growth Chart --    No data found.  Updated Vital Signs BP (!) 163/84   Pulse 70   Temp 99.2 F (37.3 C) (Oral)   Resp 20   LMP  (LMP Unknown)   SpO2 95%   Visual Acuity Right Eye Distance:   Left Eye Distance:   Bilateral Distance:    Right Eye Near:   Left Eye Near:  Bilateral Near:     Physical Exam Vitals reviewed.  Constitutional:      General: She is not in acute distress.    Appearance: She is not toxic-appearing.  Cardiovascular:     Rate and Rhythm: Normal rate and regular rhythm.     Heart sounds: No murmur heard. Pulmonary:     Effort: Pulmonary effort is normal. No respiratory distress.     Breath sounds: Normal breath sounds. No stridor. No wheezing, rhonchi or rales.  Skin:    Coloration: Skin is not pale.  Neurological:     Mental Status: She is alert and oriented to person, place, and time.  Psychiatric:        Behavior: Behavior normal.      UC Treatments / Results  Labs (all labs ordered are listed, but only abnormal results are displayed) Labs Reviewed - No data to display  EKG   Radiology No results found.  Procedures Procedures (including critical care time)  Medications Ordered in UC Medications - No data to display  Initial Impression / Assessment and Plan / UC Course  I have reviewed the triage vital signs and the nursing notes.  Pertinent labs & imaging results that were available during my care of the patient were reviewed by me and considered in my medical decision making (see chart for details).     I have asked her to go to the ER for further eval. VS over all reassuring; she will go by private car. Final Clinical Impressions(s) / UC Diagnoses   Final diagnoses:  Shortness of breath  Atypical chest pain   Discharge Instructions   None    ED Prescriptions   None    PDMP not reviewed this encounter.   Zenia Resides, MD 07/07/23 Ernestina Columbia

## 2023-07-07 NOTE — ED Triage Notes (Signed)
Pt c/o SOB with chest heaviness (denies pain) that began around 1pm today after grocery shoping. SOB is primarily with exertion. Endorses intermittent nausea. Denies BLE edema. Denies any cold/flu symptoms. Hx of Afib and hypertrophic cardiomyopathy.

## 2023-07-07 NOTE — ED Notes (Signed)
Patient is being discharged from the Urgent Care and sent to the Emergency Department via pov . Per Dr. Marlinda Mike, patient is in need of higher level of care due to shortness of breath. Patient is aware and verbalizes understanding of plan of care.  Vitals:   07/07/23 1711  BP: (!) 163/84  Pulse: 70  Resp: 20  Temp: 99.2 F (37.3 C)  SpO2: 95%

## 2023-07-08 ENCOUNTER — Observation Stay (HOSPITAL_BASED_OUTPATIENT_CLINIC_OR_DEPARTMENT_OTHER): Admit: 2023-07-08 | Payer: Medicare Other | Admitting: Internal Medicine

## 2023-07-08 ENCOUNTER — Encounter (HOSPITAL_COMMUNITY): Payer: Self-pay

## 2023-07-08 DIAGNOSIS — I422 Other hypertrophic cardiomyopathy: Secondary | ICD-10-CM | POA: Diagnosis not present

## 2023-07-08 DIAGNOSIS — R079 Chest pain, unspecified: Secondary | ICD-10-CM | POA: Diagnosis not present

## 2023-07-08 DIAGNOSIS — I361 Nonrheumatic tricuspid (valve) insufficiency: Secondary | ICD-10-CM | POA: Diagnosis not present

## 2023-07-08 DIAGNOSIS — I480 Paroxysmal atrial fibrillation: Secondary | ICD-10-CM | POA: Diagnosis not present

## 2023-07-08 DIAGNOSIS — I10 Essential (primary) hypertension: Secondary | ICD-10-CM | POA: Diagnosis not present

## 2023-07-09 DIAGNOSIS — I422 Other hypertrophic cardiomyopathy: Secondary | ICD-10-CM | POA: Diagnosis not present

## 2023-07-09 DIAGNOSIS — I10 Essential (primary) hypertension: Secondary | ICD-10-CM | POA: Diagnosis not present

## 2023-07-09 DIAGNOSIS — R079 Chest pain, unspecified: Secondary | ICD-10-CM | POA: Diagnosis not present

## 2023-07-09 DIAGNOSIS — I480 Paroxysmal atrial fibrillation: Secondary | ICD-10-CM | POA: Diagnosis not present

## 2023-07-10 ENCOUNTER — Encounter (HOSPITAL_COMMUNITY): Payer: Self-pay

## 2023-07-10 ENCOUNTER — Other Ambulatory Visit: Payer: Self-pay

## 2023-07-10 ENCOUNTER — Encounter (HOSPITAL_COMMUNITY)
Admission: EM | Disposition: A | Discharge status: Home / Self Care | Payer: Self-pay | Source: Other Acute Inpatient Hospital | Attending: *Deleted

## 2023-07-10 ENCOUNTER — Observation Stay (HOSPITAL_COMMUNITY)
Admission: EM | Admit: 2023-07-10 | Discharge: 2023-07-11 | Disposition: A | Discharge status: Home / Self Care | Payer: Medicare Other | Source: Other Acute Inpatient Hospital | Attending: *Deleted | Admitting: *Deleted

## 2023-07-10 DIAGNOSIS — I25110 Atherosclerotic heart disease of native coronary artery with unstable angina pectoris: Principal | ICD-10-CM | POA: Insufficient documentation

## 2023-07-10 DIAGNOSIS — Z96651 Presence of right artificial knee joint: Secondary | ICD-10-CM | POA: Diagnosis not present

## 2023-07-10 DIAGNOSIS — I10 Essential (primary) hypertension: Secondary | ICD-10-CM | POA: Diagnosis present

## 2023-07-10 DIAGNOSIS — I480 Paroxysmal atrial fibrillation: Secondary | ICD-10-CM | POA: Insufficient documentation

## 2023-07-10 DIAGNOSIS — I4891 Unspecified atrial fibrillation: Secondary | ICD-10-CM | POA: Diagnosis present

## 2023-07-10 DIAGNOSIS — R079 Chest pain, unspecified: Secondary | ICD-10-CM | POA: Diagnosis not present

## 2023-07-10 DIAGNOSIS — R9439 Abnormal result of other cardiovascular function study: Secondary | ICD-10-CM

## 2023-07-10 DIAGNOSIS — E782 Mixed hyperlipidemia: Secondary | ICD-10-CM | POA: Diagnosis not present

## 2023-07-10 DIAGNOSIS — I422 Other hypertrophic cardiomyopathy: Secondary | ICD-10-CM | POA: Diagnosis not present

## 2023-07-10 DIAGNOSIS — Z85828 Personal history of other malignant neoplasm of skin: Secondary | ICD-10-CM | POA: Insufficient documentation

## 2023-07-10 DIAGNOSIS — Z7901 Long term (current) use of anticoagulants: Secondary | ICD-10-CM | POA: Diagnosis not present

## 2023-07-10 DIAGNOSIS — Z79899 Other long term (current) drug therapy: Secondary | ICD-10-CM | POA: Insufficient documentation

## 2023-07-10 DIAGNOSIS — I200 Unstable angina: Principal | ICD-10-CM | POA: Diagnosis present

## 2023-07-10 HISTORY — PX: LEFT HEART CATH AND CORONARY ANGIOGRAPHY: CATH118249

## 2023-07-10 HISTORY — PX: CORONARY PRESSURE/FFR STUDY: CATH118243

## 2023-07-10 LAB — POCT ACTIVATED CLOTTING TIME: Activated Clotting Time: 268 s

## 2023-07-10 SURGERY — LEFT HEART CATH AND CORONARY ANGIOGRAPHY
Anesthesia: LOCAL

## 2023-07-10 MED ORDER — VERAPAMIL HCL 2.5 MG/ML IV SOLN
INTRAVENOUS | Status: DC | PRN
  Administered 2023-07-10: 10 mL via INTRA_ARTERIAL

## 2023-07-10 MED ORDER — VERAPAMIL HCL 2.5 MG/ML IV SOLN
INTRAVENOUS | Status: AC
  Filled 2023-07-10: qty 2

## 2023-07-10 MED ORDER — FENTANYL CITRATE (PF) 100 MCG/2ML IJ SOLN
INTRAMUSCULAR | Status: DC | PRN
  Administered 2023-07-10 (×2): 25 ug via INTRAVENOUS

## 2023-07-10 MED ORDER — LIDOCAINE HCL (PF) 1 % IJ SOLN
INTRAMUSCULAR | Status: AC
  Filled 2023-07-10: qty 30

## 2023-07-10 MED ORDER — ONDANSETRON HCL 4 MG/2ML IJ SOLN: 4.0000 mg | Freq: Four times a day (QID) | INTRAMUSCULAR | Status: DC | PRN

## 2023-07-10 MED ORDER — ACETAMINOPHEN 325 MG PO TABS
650.0000 mg | ORAL_TABLET | ORAL | Status: DC | PRN
  Administered 2023-07-11: 650 mg via ORAL
  Filled 2023-07-10: qty 2

## 2023-07-10 MED ORDER — FAMOTIDINE 20 MG PO TABS
20.0000 mg | ORAL_TABLET | Freq: Every day | ORAL | Status: DC
  Administered 2023-07-10: 20 mg via ORAL
  Filled 2023-07-10: qty 1

## 2023-07-10 MED ORDER — ACETAMINOPHEN 325 MG PO TABS: 650.0000 mg | ORAL_TABLET | ORAL | Status: DC | PRN

## 2023-07-10 MED ORDER — LIDOCAINE HCL (PF) 1 % IJ SOLN
INTRAMUSCULAR | Status: DC | PRN
  Administered 2023-07-10: 2 mL

## 2023-07-10 MED ORDER — ROSUVASTATIN CALCIUM 5 MG PO TABS: 5.0000 mg | ORAL_TABLET | Freq: Every evening | ORAL | Status: DC

## 2023-07-10 MED ORDER — ROSUVASTATIN CALCIUM 5 MG PO TABS
5.0000 mg | ORAL_TABLET | Freq: Every evening | ORAL | Status: DC
  Administered 2023-07-10: 5 mg via ORAL
  Filled 2023-07-10: qty 1

## 2023-07-10 MED ORDER — SPIRONOLACTONE 25 MG PO TABS
25.0000 mg | ORAL_TABLET | Freq: Every day | ORAL | Status: DC
  Administered 2023-07-11: 25 mg via ORAL
  Filled 2023-07-10: qty 1

## 2023-07-10 MED ORDER — FENTANYL CITRATE (PF) 100 MCG/2ML IJ SOLN
INTRAMUSCULAR | Status: AC
  Filled 2023-07-10: qty 2

## 2023-07-10 MED ORDER — CARVEDILOL 6.25 MG PO TABS
6.2500 mg | ORAL_TABLET | Freq: Two times a day (BID) | ORAL | Status: DC
  Administered 2023-07-10 – 2023-07-11 (×2): 6.25 mg via ORAL
  Filled 2023-07-10 (×2): qty 1

## 2023-07-10 MED ORDER — CARVEDILOL 6.25 MG PO TABS: 6.2500 mg | ORAL_TABLET | Freq: Two times a day (BID) | ORAL | Status: DC

## 2023-07-10 MED ORDER — FAMOTIDINE 20 MG PO TABS: 20.0000 mg | ORAL_TABLET | Freq: Every day | ORAL | Status: DC

## 2023-07-10 MED ORDER — SODIUM CHLORIDE 0.9 % WEIGHT BASED INFUSION: 3.0000 mL/kg/h | INTRAVENOUS | Status: DC

## 2023-07-10 MED ORDER — NITROGLYCERIN 1 MG/10 ML FOR IR/CATH LAB
INTRA_ARTERIAL | Status: DC | PRN
  Administered 2023-07-10: 200 ug via INTRACORONARY

## 2023-07-10 MED ORDER — ROSUVASTATIN CALCIUM 5 MG PO TABS: 10.0000 mg | ORAL_TABLET | Freq: Every evening | ORAL | Status: DC

## 2023-07-10 MED ORDER — HYDRALAZINE HCL 20 MG/ML IJ SOLN: 10.0000 mg | INTRAMUSCULAR | Status: AC | PRN

## 2023-07-10 MED ORDER — ASPIRIN 81 MG PO CHEW: 81.0000 mg | CHEWABLE_TABLET | ORAL | Status: DC

## 2023-07-10 MED ORDER — SODIUM CHLORIDE 0.9 % WEIGHT BASED INFUSION: 1.0000 mL/kg/h | INTRAVENOUS | Status: DC

## 2023-07-10 MED ORDER — IOHEXOL 350 MG/ML SOLN
INTRAVENOUS | Status: DC | PRN
  Administered 2023-07-10: 55 mL

## 2023-07-10 MED ORDER — HEPARIN (PORCINE) IN NACL 1000-0.9 UT/500ML-% IV SOLN
INTRAVENOUS | Status: DC | PRN
  Administered 2023-07-10: 1000 mL

## 2023-07-10 MED ORDER — SPIRONOLACTONE-HCTZ 25-25 MG PO TABS: 0.5000 | ORAL_TABLET | Freq: Every morning | ORAL | Status: DC

## 2023-07-10 MED ORDER — DAPAGLIFLOZIN PROPANEDIOL 5 MG PO TABS
5.0000 mg | ORAL_TABLET | Freq: Every morning | ORAL | Status: DC
  Administered 2023-07-11: 5 mg via ORAL
  Filled 2023-07-10: qty 1

## 2023-07-10 MED ORDER — HEPARIN SODIUM (PORCINE) 1000 UNIT/ML IJ SOLN
INTRAMUSCULAR | Status: DC | PRN
  Administered 2023-07-10 (×2): 4500 [IU] via INTRAVENOUS

## 2023-07-10 MED ORDER — SODIUM CHLORIDE 0.9% FLUSH: 3.0000 mL | INTRAVENOUS | Status: DC | PRN

## 2023-07-10 MED ORDER — MIDAZOLAM HCL 2 MG/2ML IJ SOLN
INTRAMUSCULAR | Status: AC
  Filled 2023-07-10: qty 2

## 2023-07-10 MED ORDER — DILTIAZEM HCL ER COATED BEADS 240 MG PO CP24: 240.0000 mg | ORAL_CAPSULE | Freq: Every day | ORAL | Status: DC

## 2023-07-10 MED ORDER — SODIUM CHLORIDE 0.9 % IV SOLN: INTRAVENOUS | Status: AC

## 2023-07-10 MED ORDER — SODIUM CHLORIDE 0.9 % IV SOLN: 250.0000 mL | INTRAVENOUS | Status: DC | PRN

## 2023-07-10 MED ORDER — SODIUM CHLORIDE 0.9% FLUSH: 3.0000 mL | Freq: Two times a day (BID) | INTRAVENOUS | Status: DC

## 2023-07-10 MED ORDER — NITROGLYCERIN 1 MG/10 ML FOR IR/CATH LAB
INTRA_ARTERIAL | Status: AC
  Filled 2023-07-10: qty 10

## 2023-07-10 MED ORDER — LABETALOL HCL 5 MG/ML IV SOLN: 10.0000 mg | INTRAVENOUS | Status: AC | PRN

## 2023-07-10 MED ORDER — MIDAZOLAM HCL 2 MG/2ML IJ SOLN
INTRAMUSCULAR | Status: DC | PRN
  Administered 2023-07-10 (×2): 1 mg via INTRAVENOUS

## 2023-07-10 MED ORDER — HEPARIN (PORCINE) 25000 UT/250ML-% IV SOLN
850.0000 [IU]/h | INTRAVENOUS | Status: DC
  Administered 2023-07-11: 850 [IU]/h via INTRAVENOUS
  Filled 2023-07-10: qty 250

## 2023-07-10 MED ORDER — MONTELUKAST SODIUM 10 MG PO TABS
10.0000 mg | ORAL_TABLET | Freq: Every day | ORAL | Status: DC
  Administered 2023-07-10: 10 mg via ORAL
  Filled 2023-07-10: qty 1

## 2023-07-10 MED ORDER — HYDROCHLOROTHIAZIDE 25 MG PO TABS
25.0000 mg | ORAL_TABLET | Freq: Every day | ORAL | Status: DC
  Administered 2023-07-11: 25 mg via ORAL
  Filled 2023-07-10: qty 1

## 2023-07-10 MED ORDER — BUPROPION HCL ER (XL) 150 MG PO TB24: 150.0000 mg | ORAL_TABLET | Freq: Every morning | ORAL | Status: DC

## 2023-07-10 MED ORDER — MONTELUKAST SODIUM 10 MG PO TABS: 10.0000 mg | ORAL_TABLET | Freq: Every day | ORAL | Status: DC

## 2023-07-10 MED ORDER — HEPARIN SODIUM (PORCINE) 1000 UNIT/ML IJ SOLN
INTRAMUSCULAR | Status: AC
  Filled 2023-07-10: qty 10

## 2023-07-10 MED ORDER — NITROGLYCERIN 0.4 MG SL SUBL: 0.4000 mg | SUBLINGUAL_TABLET | SUBLINGUAL | Status: DC | PRN

## 2023-07-10 SURGICAL SUPPLY — 14 items
CATH 5FR JL3.5 JR4 ANG PIG MP (CATHETERS) IMPLANT
CATH VISTA GUIDE 6FR XBLD 3.5 (CATHETERS) IMPLANT
DEVICE RAD COMP TR BAND LRG (VASCULAR PRODUCTS) IMPLANT
GLIDESHEATH SLEND SS 6F .021 (SHEATH) IMPLANT
GUIDEWIRE INQWIRE 1.5J.035X260 (WIRE) IMPLANT
GUIDEWIRE PRESSURE X 175 (WIRE) IMPLANT
INQWIRE 1.5J .035X260CM (WIRE) ×1
KIT ESSENTIALS PG (KITS) IMPLANT
PACK CARDIAC CATHETERIZATION (CUSTOM PROCEDURE TRAY) ×1 IMPLANT
PROTECTION STATION PRESSURIZED (MISCELLANEOUS) ×1
SET ATX-X65L (MISCELLANEOUS) IMPLANT
STATION PROTECTION PRESSURIZED (MISCELLANEOUS) IMPLANT
WIRE HI TORQ VERSACORE-J 145CM (WIRE) IMPLANT
WIRE MICRO SET SILHO 5FR 7 (SHEATH) IMPLANT

## 2023-07-10 NOTE — Progress Notes (Signed)
Pt arrived by [facility] to cath lab holding, [facility], connected to monitor, denies pain, see flowsheets for VS, remains on RA, safety maintained, Evan, PA and Dr. Okey Dupre to bedside shortly after arrival to unit  Heparin Gtt infusing upon arrival to cath lab holding, dc'd per Dr. Okey Dupre at [phone]

## 2023-07-10 NOTE — H&P (View-Only) (Signed)
Cardiology Admission History and Physical   Patient ID: Monica Anderson MRN: [id number]; DOB: Dec 30, 1945   Admission date: (Not on file)  PCP:  Buckner Malta, MD   [facility] Providers Cardiologist:  Thomasene Ripple, DO        Chief Complaint:  abnormal stress test  Patient Profile:   Monica Anderson is a 78 y.o. female with history of apical variant hypertrophic cardiomyopathy, paroxysmal atrial fibrillation on Cardizem and Eliquis, essential hypertension, hyperlipidemia, Mnire's disease who is being seen 07/10/2023 for the evaluation of chest pain.  History of Present Illness:   [name] presented to an urgent care on 1/31 for evaluation of chest pain and was subsequently sent to the nearest emergency department which was at [facility].  Pain has begun while patient was unloading groceries from her car and lasted for approximately 3 hours.  Pain was located substernally and was associated with nausea and shortness of breath.  Initial labs found troponin less than 0.01, proBNP 1840, D-dimer 0.54.  Given her symptoms, patient admitted for further evaluation of her symptoms.  Patient was seen by Dr. Bing Matter on 07/08/2023.  Per Dr. Vanetta Shawl note, patient in sinus rhythm at the time of her presentation to the emergency department but was found to be in atrial fibrillation at the time of his consult.  Given her symptoms, patient underwent nuclear stress test which showed mild defect involving basal portion of inferior wall.  Stress imaging also showed mild defect involving apical and midportion of the anterior wall as well as basal portion of inferior wall.  Gated images showed akinesis to dyskinesis involving the apical portion of anterior wall.  Given her symptoms, these findings on her stress test were concerning for left anterior descending disease.  Given this abnormal stress test, decision made to transfer patient to [facility] for cardiac catheterization.  Patient  transitioned from home Eliquis to heparin given plans for ischemic evaluation.   Past Medical History:  Diagnosis Date   Anemia    Arthritis    Asymmetrical left sensorineural hearing loss 10/01/2019   Atrial fibrillation (HCC) 10/02/2019   Essential hypertension 10/03/2019   History of colon polyps    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, left (HCC) 01/17/2017   Meniere's disease 08/29/2019   with hearing loss in Lt ear   Mixed hyperlipidemia 10/03/2019   Multiple dysplastic nevi    Other hypertrophic cardiomyopathy (HCC)    Pleural effusion     Past Surgical History:  Procedure Laterality Date   APPENDECTOMY  1958   BREAST BIOPSY  1973   CATARACT EXTRACTION, BILATERAL  2019   COLONOSCOPY  11/07/2016   Colonic polyp status post polypectomy. Minimal sigmoid diverticulosis   DILATION AND CURETTAGE OF UTERUS  1968   MOHS SURGERY Left 2019   Melanoma on left ear   TOTAL KNEE ARTHROPLASTY Right 07/18/2022   Procedure: TOTAL KNEE ARTHROPLASTY;  Surgeon: Ollen Gross, MD;  Location: [facility];  Service: Orthopedics;  Laterality: Right;   TUBAL LIGATION  1977     Medications Prior to Admission: Prior to Admission medications   Medication Sig Start Date End Date Taking? Authorizing Provider  buPROPion (WELLBUTRIN XL) 150 MG 24 hr tablet Take 150 mg by mouth in the morning.    [provider]  Calcium Carb-Cholecalciferol (CALCIUM 600+D3 PO) Take 1 tablet by mouth in the morning.    [provider]  carvedilol (COREG) 6.25 MG tablet Take 1 tablet (6.25 mg  total) by mouth 2 (two) times daily. 11/29/22   Tobb, Kardie, DO  Cholecalciferol (VITAMIN D3) 50 MCG (2000 UT) CAPS Take 1 capsule by mouth daily.    [provider]  diltiazem (CARDIZEM CD) 240 MG 24 hr capsule TAKE 1 CAPSULE BY MOUTH EVERY DAY 06/05/23   Tobb, Kardie, DO  ELIQUIS 5 MG TABS tablet Take 5 mg by mouth 2 (two) times daily. 01/16/18   [provider]   estradiol (ESTRACE) 0.1 MG/GM vaginal cream Place 1 Applicatorful vaginally every Monday, Wednesday, and Friday at 8 PM.    [provider]  famotidine (PEPCID) 20 MG tablet Take 20 mg by mouth at bedtime. 08/09/21   [provider]  FARXIGA 5 MG TABS tablet Take 5 mg by mouth in the morning. 03/02/22   [provider]  ketoconazole (NIZORAL) 2 % cream Apply 1 Application topically 2 (two) times daily as needed (irritation.).    [provider]  montelukast (SINGULAIR) 10 MG tablet Take 10 mg by mouth at bedtime. 08/09/21   [provider]  Multiple Vitamins-Minerals (PRESERVISION AREDS 2 PO) Take 1 tablet by mouth in the morning.    [provider]  nitrofurantoin, macrocrystal-monohydrate, (MACROBID) 100 MG capsule Take 100 mg by mouth daily. 05/14/23   [provider]  Omega-3 Fatty Acids (OMEGA-3 FISH OIL) 1200 MG CAPS Take 1,200 mg by mouth every evening.    [provider]  rosuvastatin (CRESTOR) 5 MG tablet Take 5 mg by mouth every evening. 12/10/17   [provider]  spironolactone-hydrochlorothiazide (ALDACTAZIDE) 25-25 MG tablet Take 0.5 tablets by mouth in the morning.    [provider]     Allergies:   No Known Allergies  Social History:   Social History   Socioeconomic History   Marital status: Married    Spouse name: Not on file   Number of children: 3   Years of education: Not on file   Highest education level: Not on file  Occupational History   Occupation: retired  Tobacco Use   Smoking status: Never   Smokeless tobacco: Never  Vaping Use   Vaping status: Never Used  Substance and Sexual Activity   Alcohol use: Yes    Comment: Wine maybe 2-3 times a year   Drug use: Never   Sexual activity: Not on file  Other Topics Concern   Not on file  Social History Narrative   Right handed    caffeine: 1 cup of decaf in the AM   Lives with husband and one son   Social Drivers of Manufacturing engineer Strain: Low Risk  (03/20/2022)   Received from [facility], [facility]   Overall Financial Resource Strain (CARDIA)    Difficulty of Paying Living Expenses: Not hard at all  Food Insecurity: No Food Insecurity (07/18/2022)   Hunger Vital Sign    Worried About Running Out of Food in the Last Year: Never true    Ran Out of Food in the Last Year: Never true  Transportation Needs: No Transportation Needs (07/18/2022)   PRAPARE - Administrator, Civil Service (Medical): No    Lack of Transportation (Non-Medical): No  Physical Activity: Not on file  Stress: No Stress Concern Present (03/20/2022)   Received from [facility], [facility] of Occupational Health - Occupational Stress Questionnaire    Feeling of Stress : Not at all  Social Connections: Unknown (03/20/2022)   Received  from [facility], [facility]   Social Network    Social Network: Not on file  Intimate Partner Violence: Not At Risk (07/18/2022)   Humiliation, Afraid, Rape, and Kick questionnaire    Fear of Current or Ex-Partner: No    Emotionally Abused: No    Physically Abused: No    Sexually Abused: No    Family History:   The patient's family history includes Atrial fibrillation in her mother; Diabetes in her father; Hypertension in her father and mother; Stroke in her father. There is no history of Colon cancer, Liver cancer, Stomach cancer, Rectal cancer, or Esophageal cancer.    ROS:  Please see the history of present illness.  All other ROS reviewed and negative.     Physical Exam/Data:  There were no vitals filed for this visit. No intake or output data in the 24 hours ending 07/10/23 1628    11/04/2022   10:00 AM 07/18/2022    5:52 AM 07/08/2022   11:26 AM  Last 3 Weights  Weight (lbs) 201 lb 191 lb 12.8 oz 194 lb  Weight (kg) 91.173 kg 87 kg 87.998 kg     There is no height or weight on file to calculate BMI.  General:  Well nourished, well  developed, in no acute distress HEENT: normal Neck: no JVD Vascular: No carotid bruits; Distal pulses 2+ bilaterally   Cardiac:  normal S1, S2; RRR; no murmur  Lungs:  clear to auscultation bilaterally, no wheezing, rhonchi or rales  Abd: soft, nontender, no hepatomegaly  Ext: no edema Musculoskeletal:  No deformities, BUE and BLE strength normal and equal Skin: warm and dry  Neuro:  CNs 2-12 intact, no focal abnormalities noted Psych:  Normal affect    EKG:  ECG pending  Relevant CV Studies:  07/08/2023 nuclear stress test (OS[facility])  Resting imaging show mild defect involving basal portion of the inferior wall Stress imaging showed mild defect involving basal portion of the inferior wall as well as moderate defect involving apical and mid portion anterior wall Gated imaging not normal showing akinesis to dyskinesis involving apical portion of the anterior wall Ejection fraction 58%   Media Information   Document Information  Photos  TTE [id number]  07/10/2023 14:45  Attached To:  [name]  Source Information  Perlie Gold, PA-C  [address]  Document History     03/09/20 Cardiac CTA  FINDINGS: A 100 kV prospective scan was triggered in the descending thoracic aorta at 111 HU's. Axial non-contrast 3 mm slices were carried out through the heart. The data set was analyzed on a dedicated work station and scored using the Agatson method. Gantry rotation speed was 250 msecs and collimation was .6 mm. 0.8 mg of sl NTG was given. The 3D data set was reconstructed in 5% intervals of the 35-75% of the R-R cycle. Phases were analyzed on a dedicated work station using MPR, MIP and VRT modes. The patient received 80 cc of contrast.   Coronary Arteries:  Normal coronary origin.  Right dominance.   RCA is a large dominant artery that gives rise to PDA and PLA. There is no plaque.   Left main is a large artery that gives rise to LAD, ramus, and  LCX arteries.   LAD is a large vessel. There is calcified plaque in the mid LAD causing 0-24%. There is calcified plaque in the mid D1 causing 0-24% stenosis   LCX is a non-dominant artery that gives rise to one large  OM1 branch. There is no plaque.   There is a small ramus that has no plaque.   Other findings:   Left Ventricle: Normal size.  Apical hypertrophy   Left Atrium: Mild enlargement   Pulmonary Veins: Normal configuration   Right Ventricle: Normal size   Right Atrium: Mild enlargement   Cardiac valves: Mild AV calcifications   Thoracic aorta: Normal size   Pulmonary Arteries: Normal size   Systemic Veins: Normal drainage   Pericardium: Normal thickness   IMPRESSION: 1. Coronary calcium score of 3. This was 31st percentile for age and sex matched control.   2. Normal coronary origin with right dominance.   3. Nonobstructive CAD with calcified plaque in the mid LAD and mid D1 causing minimal (0-24%) stenosis   CAD-RADS 1. Minimal non-obstructive CAD (0-24%). Consider non-atherosclerotic causes of chest pain. Consider preventive therapy and risk factor modification.  Laboratory Data:  High Sensitivity Troponin:  No results for input(s): "TROPONINIHS" in the last 720 hours.    ChemistryNo results for input(s): "NA", "K", "CL", "CO2", "GLUCOSE", "BUN", "CREATININE", "CALCIUM", "MG", "GFRNONAA", "GFRAA", "ANIONGAP" in the last 168 hours.  No results for input(s): "PROT", "ALBUMIN", "AST", "ALT", "ALKPHOS", "BILITOT" in the last 168 hours. Lipids No results for input(s): "CHOL", "TRIG", "HDL", "LABVLDL", "LDLCALC", "CHOLHDL" in the last 168 hours. HematologyNo results for input(s): "WBC", "RBC", "HGB", "HCT", "MCV", "MCH", "MCHC", "RDW", "PLT" in the last 168 hours. Thyroid No results for input(s): "TSH", "FREET4" in the last 168 hours. BNPNo results for input(s): "BNP", "PROBNP" in the last 168 hours.  DDimer No results for input(s): "DDIMER" in the last  168 hours.   Radiology/Studies:  No results found.   Assessment and Plan:   Exertional chest pain Abnormal stress test  Patient sent to [facility] emergency department from [facility] in [place] after she presented for evaluation of chest pain that occurred in the setting of exertion and took several hours to resolve.  Troponin negative but given concerning nature of symptoms, patient underwent nuclear stress test. This showed mild defect involving basal portion of inferior wall.  Stress imaging also showed mild defect involving apical and midportion of the anterior wall as well as basal portion of inferior wall.  Gated images showed akinesis to dyskinesis involving the apical portion of anterior wall. (Of note, patient with prior cardiac CTA in 2021 that showed minimal non-obstructive CAD.   Patient with paroxysmal atrial fibrillation now on heparin pending left heart catheterization with Dr. Okey Dupre.  No recurrent chest pressure.  Anti-platelet regimen per LHC findings Continue Coreg 6.25mg  BID, Crestor 10mg   Hyperlipidemia  No recent lipid panel. Continue home Crestor 10mg  pending repeat lipid panel and lipoprotein A.  No results found for: "CHOL", "HDL", "LDLCALC", "LDLDIRECT", "TRIG", "CHOLHDL"   Paroxysmal atrial fibrillation CHA2DS2-VASc Score = 4   Patient with history of paroxysmal atrial fibrillation on diltiazem and Eliquis was initially in sinus rhythm at outside hospital, subsequently developed atrial fibrillation before having spontaneous conversion back to sinus rhythm.  NSR on exam prior to catheterization. Continue heparin pending left heart catheterization.  Resume Eliquis following heart catheterization. Continue diltiazem for rate control.  Hypertension  Patient on HCTZ/spironolactone, carvedilol 6.25 mg twice daily, Cardizem CD 240 mg.  Continue home anti-hypertensive regimen.    Risk Assessment/Risk Scores:         CHA2DS2-VASc Score = 4   This  indicates a 4.8% annual risk of stroke. The patient's score is based upon: CHF History: 0 HTN History: 1 Diabetes  History: 0 Stroke History: 0 Vascular Disease History: 0 Age Score: 2 Gender Score: 1     Code Status: Full Code  Severity of Illness: The appropriate patient status for this patient is OBSERVATION. Observation status is judged to be reasonable and necessary in order to provide the required intensity of service to ensure the patient's safety. The patient's presenting symptoms, physical exam findings, and initial radiographic and laboratory data in the context of their medical condition is felt to place them at decreased risk for further clinical deterioration. Furthermore, it is anticipated that the patient will be medically stable for discharge from the hospital within 2 midnights of admission.    For questions or updates, please contact [facility] Please consult [email or web address] for contact info under     Signed, Perlie Gold, PA-C  07/10/2023 4:28 PM

## 2023-07-10 NOTE — H&P (Signed)
Cardiology Admission History and Physical   Patient ID: Monica Anderson MRN: [id number]; DOB: Dec 30, 1945   Admission date: (Not on file)  PCP:  Buckner Malta, MD   [facility] Providers Cardiologist:  Thomasene Ripple, DO        Chief Complaint:  abnormal stress test  Patient Profile:   Monica Anderson is a 78 y.o. female with history of apical variant hypertrophic cardiomyopathy, paroxysmal atrial fibrillation on Cardizem and Eliquis, essential hypertension, hyperlipidemia, Mnire's disease who is being seen 07/10/2023 for the evaluation of chest pain.  History of Present Illness:   Ms. Monica Anderson presented to an urgent care on 1/31 for evaluation of chest pain and was subsequently sent to the nearest emergency department which was at [facility].  Pain has begun while patient was unloading groceries from her car and lasted for approximately 3 hours.  Pain was located substernally and was associated with nausea and shortness of breath.  Initial labs found troponin less than 0.01, proBNP 1840, D-dimer 0.54.  Given her symptoms, patient admitted for further evaluation of her symptoms.  Patient was seen by Dr. Bing Matter on 07/08/2023.  Per Dr. Vanetta Shawl note, patient in sinus rhythm at the time of her presentation to the emergency department but was found to be in atrial fibrillation at the time of his consult.  Given her symptoms, patient underwent nuclear stress test which showed mild defect involving basal portion of inferior wall.  Stress imaging also showed mild defect involving apical and midportion of the anterior wall as well as basal portion of inferior wall.  Gated images showed akinesis to dyskinesis involving the apical portion of anterior wall.  Given her symptoms, these findings on her stress test were concerning for left anterior descending disease.  Given this abnormal stress test, decision made to transfer patient to [facility] for cardiac catheterization.  Patient  transitioned from home Eliquis to heparin given plans for ischemic evaluation.   Past Medical History:  Diagnosis Date   Anemia    Arthritis    Asymmetrical left sensorineural hearing loss 10/01/2019   Atrial fibrillation (HCC) 10/02/2019   Essential hypertension 10/03/2019   History of colon polyps    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, left (HCC) 01/17/2017   Meniere's disease 08/29/2019   with hearing loss in Lt ear   Mixed hyperlipidemia 10/03/2019   Multiple dysplastic nevi    Other hypertrophic cardiomyopathy (HCC)    Pleural effusion     Past Surgical History:  Procedure Laterality Date   APPENDECTOMY  1958   BREAST BIOPSY  1973   CATARACT EXTRACTION, BILATERAL  2019   COLONOSCOPY  11/07/2016   Colonic polyp status post polypectomy. Minimal sigmoid diverticulosis   DILATION AND CURETTAGE OF UTERUS  1968   MOHS SURGERY Left 2019   Melanoma on left ear   TOTAL KNEE ARTHROPLASTY Right 07/18/2022   Procedure: TOTAL KNEE ARTHROPLASTY;  Surgeon: Ollen Gross, MD;  Location: [facility];  Service: Orthopedics;  Laterality: Right;   TUBAL LIGATION  1977     Medications Prior to Admission: Prior to Admission medications   Medication Sig Start Date End Date Taking? Authorizing Provider  buPROPion (WELLBUTRIN XL) 150 MG 24 hr tablet Take 150 mg by mouth in the morning.    [provider]  Calcium Carb-Cholecalciferol (CALCIUM 600+D3 PO) Take 1 tablet by mouth in the morning.    [provider]  carvedilol (COREG) 6.25 MG tablet Take 1 tablet (6.25 mg  total) by mouth 2 (two) times daily. 11/29/22   Tobb, Kardie, DO  Cholecalciferol (VITAMIN D3) 50 MCG (2000 UT) CAPS Take 1 capsule by mouth daily.    [provider]  diltiazem (CARDIZEM CD) 240 MG 24 hr capsule TAKE 1 CAPSULE BY MOUTH EVERY DAY 06/05/23   Tobb, Kardie, DO  ELIQUIS 5 MG TABS tablet Take 5 mg by mouth 2 (two) times daily. 01/16/18   [provider]   estradiol (ESTRACE) 0.1 MG/GM vaginal cream Place 1 Applicatorful vaginally every Monday, Wednesday, and Friday at 8 PM.    [provider]  famotidine (PEPCID) 20 MG tablet Take 20 mg by mouth at bedtime. 08/09/21   [provider]  FARXIGA 5 MG TABS tablet Take 5 mg by mouth in the morning. 03/02/22   [provider]  ketoconazole (NIZORAL) 2 % cream Apply 1 Application topically 2 (two) times daily as needed (irritation.).    [provider]  montelukast (SINGULAIR) 10 MG tablet Take 10 mg by mouth at bedtime. 08/09/21   [provider]  Multiple Vitamins-Minerals (PRESERVISION AREDS 2 PO) Take 1 tablet by mouth in the morning.    [provider]  nitrofurantoin, macrocrystal-monohydrate, (MACROBID) 100 MG capsule Take 100 mg by mouth daily. 05/14/23   [provider]  Omega-3 Fatty Acids (OMEGA-3 FISH OIL) 1200 MG CAPS Take 1,200 mg by mouth every evening.    [provider]  rosuvastatin (CRESTOR) 5 MG tablet Take 5 mg by mouth every evening. 12/10/17   [provider]  spironolactone-hydrochlorothiazide (ALDACTAZIDE) 25-25 MG tablet Take 0.5 tablets by mouth in the morning.    [provider]     Allergies:   No Known Allergies  Social History:   Social History   Socioeconomic History   Marital status: Married    Spouse name: Not on file   Number of children: 3   Years of education: Not on file   Highest education level: Not on file  Occupational History   Occupation: retired  Tobacco Use   Smoking status: Never   Smokeless tobacco: Never  Vaping Use   Vaping status: Never Used  Substance and Sexual Activity   Alcohol use: Yes    Comment: Wine maybe 2-3 times a year   Drug use: Never   Sexual activity: Not on file  Other Topics Concern   Not on file  Social History Narrative   Right handed    caffeine: 1 cup of decaf in the AM   Lives with husband and one son   Social Drivers of Manufacturing engineer Strain: Low Risk  (03/20/2022)   Received from [facility], [facility]   Overall Financial Resource Strain (CARDIA)    Difficulty of Paying Living Expenses: Not hard at all  Food Insecurity: No Food Insecurity (07/18/2022)   Hunger Vital Sign    Worried About Running Out of Food in the Last Year: Never true    Ran Out of Food in the Last Year: Never true  Transportation Needs: No Transportation Needs (07/18/2022)   PRAPARE - Administrator, Civil Service (Medical): No    Lack of Transportation (Non-Medical): No  Physical Activity: Not on file  Stress: No Stress Concern Present (03/20/2022)   Received from [facility], [facility] Occupational [facility] - Occupational Stress Questionnaire    Feeling of Stress : Not at all  Social Connections: Unknown (03/20/2022)   Received  from [facility], [facility]   Social Network    Social Network: Not on file  Intimate Partner Violence: Not At Risk (07/18/2022)   Humiliation, Afraid, Rape, and Kick questionnaire    Fear of Current or Ex-Partner: No    Emotionally Abused: No    Physically Abused: No    Sexually Abused: No    Family History:   The patient's family history includes Atrial fibrillation in her mother; Diabetes in her father; Hypertension in her father and mother; Stroke in her father. There is no history of Colon cancer, Liver cancer, Stomach cancer, Rectal cancer, or Esophageal cancer.    ROS:  Please see the history of present illness.  All other ROS reviewed and negative.     Physical Exam/Data:  There were no vitals filed for this visit. No intake or output data in the 24 hours ending 07/10/23 1628    11/04/2022   10:00 AM 07/18/2022    5:52 AM 07/08/2022   11:26 AM  Last 3 Weights  Weight (lbs) 201 lb 191 lb 12.8 oz 194 lb  Weight (kg) 91.173 kg 87 kg 87.998 kg     There is no height or weight on file to calculate BMI.  General:  Well nourished, well  developed, in no acute distress HEENT: normal Neck: no JVD Vascular: No carotid bruits; Distal pulses 2+ bilaterally   Cardiac:  normal S1, S2; RRR; no murmur  Lungs:  clear to auscultation bilaterally, no wheezing, rhonchi or rales  Abd: soft, nontender, no hepatomegaly  Ext: no edema Musculoskeletal:  No deformities, BUE and BLE strength normal and equal Skin: warm and dry  Neuro:  CNs 2-12 intact, no focal abnormalities noted Psych:  Normal affect    EKG:  ECG pending  Relevant CV Studies:  07/08/2023 nuclear stress test (OS[facility])  Resting imaging show mild defect involving basal portion of the inferior wall Stress imaging showed mild defect involving basal portion of the inferior wall as well as moderate defect involving apical and mid portion anterior wall Gated imaging not normal showing akinesis to dyskinesis involving apical portion of the anterior wall Ejection fraction 58%   Media Information   Document Information  Photos  TTE [id number]  07/10/2023 14:45  Attached To:  [name]  Source Information  Perlie Gold, PA-C  [address]  Document History     03/09/20 Cardiac CTA  FINDINGS: A 100 kV prospective scan was triggered in the descending thoracic aorta at 111 HU's. Axial non-contrast 3 mm slices were carried out through the heart. The data set was analyzed on a dedicated work station and scored using the Agatson method. Gantry rotation speed was 250 msecs and collimation was .6 mm. 0.8 mg of sl NTG was given. The 3D data set was reconstructed in 5% intervals of the 35-75% of the R-R cycle. Phases were analyzed on a dedicated work station using MPR, MIP and VRT modes. The patient received 80 cc of contrast.   Coronary Arteries:  Normal coronary origin.  Right dominance.   RCA is a large dominant artery that gives rise to PDA and PLA. There is no plaque.   Left main is a large artery that gives rise to LAD, ramus, and  LCX arteries.   LAD is a large vessel. There is calcified plaque in the mid LAD causing 0-24%. There is calcified plaque in the mid D1 causing 0-24% stenosis   LCX is a non-dominant artery that gives rise to one large  OM1 branch. There is no plaque.   There is a small ramus that has no plaque.   Other findings:   Left Ventricle: Normal size.  Apical hypertrophy   Left Atrium: Mild enlargement   Pulmonary Veins: Normal configuration   Right Ventricle: Normal size   Right Atrium: Mild enlargement   Cardiac valves: Mild AV calcifications   Thoracic aorta: Normal size   Pulmonary Arteries: Normal size   Systemic Veins: Normal drainage   Pericardium: Normal thickness   IMPRESSION: 1. Coronary calcium score of 3. This was 31st percentile for age and sex matched control.   2. Normal coronary origin with right dominance.   3. Nonobstructive CAD with calcified plaque in the mid LAD and mid D1 causing minimal (0-24%) stenosis   CAD-RADS 1. Minimal non-obstructive CAD (0-24%). Consider non-atherosclerotic causes of chest pain. Consider preventive therapy and risk factor modification.  Laboratory Data:  High Sensitivity Troponin:  No results for input(s): "TROPONINIHS" in the last 720 hours.    ChemistryNo results for input(s): "NA", "K", "CL", "CO2", "GLUCOSE", "BUN", "CREATININE", "CALCIUM", "MG", "GFRNONAA", "GFRAA", "ANIONGAP" in the last 168 hours.  No results for input(s): "PROT", "ALBUMIN", "AST", "ALT", "ALKPHOS", "BILITOT" in the last 168 hours. Lipids No results for input(s): "CHOL", "TRIG", "HDL", "LABVLDL", "LDLCALC", "CHOLHDL" in the last 168 hours. HematologyNo results for input(s): "WBC", "RBC", "HGB", "HCT", "MCV", "MCH", "MCHC", "RDW", "PLT" in the last 168 hours. Thyroid No results for input(s): "TSH", "FREET4" in the last 168 hours. BNPNo results for input(s): "BNP", "PROBNP" in the last 168 hours.  DDimer No results for input(s): "DDIMER" in the last  168 hours.   Radiology/Studies:  No results found.   Assessment and Plan:   Exertional chest pain Abnormal stress test  Patient sent to [facility] emergency department from [facility] in [place] after she presented for evaluation of chest pain that occurred in the setting of exertion and took several hours to resolve.  Troponin negative but given concerning nature of symptoms, patient underwent nuclear stress test. This showed mild defect involving basal portion of inferior wall.  Stress imaging also showed mild defect involving apical and midportion of the anterior wall as well as basal portion of inferior wall.  Gated images showed akinesis to dyskinesis involving the apical portion of anterior wall. (Of note, patient with prior cardiac CTA in 2021 that showed minimal non-obstructive CAD.   Patient with paroxysmal atrial fibrillation now on heparin pending left heart catheterization with Dr. Okey Dupre.  No recurrent chest pressure.  Anti-platelet regimen per LHC findings Continue Coreg 6.25mg  BID, Crestor 10mg   Hyperlipidemia  No recent lipid panel. Continue home Crestor 10mg  pending repeat lipid panel and lipoprotein A.  No results found for: "CHOL", "HDL", "LDLCALC", "LDLDIRECT", "TRIG", "CHOLHDL"   Paroxysmal atrial fibrillation CHA2DS2-VASc Score = 4   Patient with history of paroxysmal atrial fibrillation on diltiazem and Eliquis was initially in sinus rhythm at outside hospital, subsequently developed atrial fibrillation before having spontaneous conversion back to sinus rhythm.  NSR on exam prior to catheterization. Continue heparin pending left heart catheterization.  Resume Eliquis following heart catheterization. Continue diltiazem for rate control.  Hypertension  Patient on HCTZ/spironolactone, carvedilol 6.25 mg twice daily, Cardizem CD 240 mg.  Continue home anti-hypertensive regimen.    Risk Assessment/Risk Scores:         CHA2DS2-VASc Score = 4   This  indicates a 4.8% annual risk of stroke. The patient's score is based upon: CHF History: 0 HTN History: 1 Diabetes  History: 0 Stroke History: 0 Vascular Disease History: 0 Age Score: 2 Gender Score: 1     Code Status: Full Code  Severity of Illness: The appropriate patient status for this patient is OBSERVATION. Observation status is judged to be reasonable and necessary in order to provide the required intensity of service to ensure the patient's safety. The patient's presenting symptoms, physical exam findings, and initial radiographic and laboratory data in the context of their medical condition is felt to place them at decreased risk for further clinical deterioration. Furthermore, it is anticipated that the patient will be medically stable for discharge from the hospital within 2 midnights of admission.    For questions or updates, please contact [facility] Please consult [email or web address] for contact info under     Signed, Perlie Gold, PA-C  07/10/2023 4:28 PM

## 2023-07-10 NOTE — Interval H&P Note (Signed)
History and Physical Interval Note:  07/10/2023 5:06 PM  Monica Anderson  has presented today for surgery, with the diagnosis of unstable angina and abnormal stress test.  The various methods of treatment have been discussed with the patient and family. After consideration of risks, benefits and other options for treatment, the patient has consented to  Procedure(s): LEFT HEART CATH AND CORONARY ANGIOGRAPHY (N/A) as a surgical intervention.  The patient's history has been reviewed, patient examined, no change in status, stable for surgery.  I have reviewed the patient's chart and labs.  Questions were answered to the patient's satisfaction.    Cath Lab Visit (complete for each Cath Lab visit)  Clinical Evaluation Leading to the Procedure:   ACS: Yes.    Non-ACS:  N/A  Tyrrell Stephens

## 2023-07-10 NOTE — Progress Notes (Signed)
PHARMACY - ANTICOAGULATION CONSULT NOTE  Pharmacy Consult for Heparin Indication: atrial fibrillation  No Known Allergies  Patient Measurements:   Heparin Dosing Weight:   Vital Signs: BP: 134/61 (02/03 1744) Pulse Rate: 59 (02/03 1744)  Labs: No results for input(s): "HGB", "HCT", "PLT", "APTT", "LABPROT", "INR", "HEPARINUNFRC", "HEPRLOWMOCWT", "CREATININE", "CKTOTAL", "CKMB", "TROPONINIHS" in the last 72 hours.  CrCl cannot be calculated (Patient's most recent lab result is older than the maximum 21 days allowed.).   Medical History: Past Medical History:  Diagnosis Date   Anemia    Arthritis    Asymmetrical left sensorineural hearing loss 10/01/2019   Atrial fibrillation (HCC) 10/02/2019   Essential hypertension 10/03/2019   History of colon polyps    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, left (HCC) 01/17/2017   Meniere's disease 08/29/2019   with hearing loss in Lt ear   Mixed hyperlipidemia 10/03/2019   Multiple dysplastic nevi    Other hypertrophic cardiomyopathy (HCC)    Pleural effusion     Medications:  Scheduled:   carvedilol  6.25 mg Oral BID   [START ON 07/11/2023] dapagliflozin propanediol  5 mg Oral q AM   famotidine  20 mg Oral QHS   montelukast  10 mg Oral QHS   nitroGLYCERIN       rosuvastatin  5 mg Oral QPM   sodium chloride flush  3 mL Intravenous Q12H   [START ON 07/11/2023] spironolactone-hydrochlorothiazide  0.5 tablet Oral q AM   Infusions:   sodium chloride     sodium chloride     PRN: sodium chloride, acetaminophen, hydrALAZINE, labetalol, nitroGLYCERIN, ondansetron (ZOFRAN) IV, sodium chloride flush  Assessment: 78 yo female with PAF on Eliquis, apical hypertrophic cardiomyopathy, hypertension, hyperlipidemia, and Mnire's disease transferred from [facility] for cardiac cath.   Spoke with pharmacist at [facility] - current heparin rate is 850 units/hr, most recent aPTT was therapeutic at 53 sec  (41-55 is therapeutic range at [facility]). Hgb 14.6, Plts 143, SCr 0.8  Pharmacy consulted to resume heparin 2hr after TR band removal.  Goal of Therapy:  Heparin level 0.3-0.7 units/ml aPTT 66-102 seconds Monitor platelets by anticoagulation protocol: Yes   Plan:  2hr after TR band removed, resume heparin 850 units/hr Check aPTT and HL 8hr after resuming F/u resuming Eliquis 2/4  Loralee Pacas, PharmD, BCPS 07/10/2023,6:10 PM  Please check AMION for all [facility] Pharmacy phone numbers After 10:00 PM, call Main Pharmacy [phone]

## 2023-07-11 ENCOUNTER — Encounter (HOSPITAL_COMMUNITY): Payer: Self-pay | Admitting: Internal Medicine

## 2023-07-11 DIAGNOSIS — I25110 Atherosclerotic heart disease of native coronary artery with unstable angina pectoris: Secondary | ICD-10-CM | POA: Diagnosis not present

## 2023-07-11 DIAGNOSIS — Z85828 Personal history of other malignant neoplasm of skin: Secondary | ICD-10-CM | POA: Diagnosis not present

## 2023-07-11 DIAGNOSIS — Z79899 Other long term (current) drug therapy: Secondary | ICD-10-CM | POA: Diagnosis not present

## 2023-07-11 DIAGNOSIS — I480 Paroxysmal atrial fibrillation: Secondary | ICD-10-CM | POA: Diagnosis not present

## 2023-07-11 DIAGNOSIS — E782 Mixed hyperlipidemia: Secondary | ICD-10-CM | POA: Diagnosis not present

## 2023-07-11 DIAGNOSIS — Z96651 Presence of right artificial knee joint: Secondary | ICD-10-CM | POA: Diagnosis not present

## 2023-07-11 LAB — BASIC METABOLIC PANEL
Anion gap: 10 (ref 5–15)
BUN: 12 mg/dL (ref 8–23)
CO2: 25 mmol/L (ref 22–32)
Calcium: 8.8 mg/dL — ABNORMAL LOW (ref 8.9–10.3)
Chloride: 106 mmol/L (ref 98–111)
Creatinine, Ser: 1.03 mg/dL — ABNORMAL HIGH (ref 0.44–1.00)
GFR, Estimated: 56 mL/min — ABNORMAL LOW (ref >60)
Glucose, Bld: 127 mg/dL — ABNORMAL HIGH (ref 70–99)
Potassium: 3.8 mmol/L (ref 3.5–5.1)
Sodium: 141 mmol/L (ref 135–145)

## 2023-07-11 LAB — CBC
HCT: 46.2 % — ABNORMAL HIGH (ref 36.0–46.0)
Hemoglobin: 15.1 g/dL — ABNORMAL HIGH (ref 12.0–15.0)
MCH: 31.4 pg (ref 26.0–34.0)
MCHC: 32.7 g/dL (ref 30.0–36.0)
MCV: 96 fL (ref 80.0–100.0)
Platelets: 167 10*3/uL (ref 150–400)
RBC: 4.81 MIL/uL (ref 3.87–5.11)
RDW: 14.1 % (ref 11.5–15.5)
WBC: 4.3 10*3/uL (ref 4.0–10.5)
nRBC: 0 % (ref 0.0–0.2)

## 2023-07-11 LAB — LIPID PANEL
Cholesterol: 119 mg/dL (ref 0–200)
HDL: 38 mg/dL — ABNORMAL LOW (ref >40)
LDL Cholesterol: 69 mg/dL (ref 0–99)
Total CHOL/HDL Ratio: 3.1 {ratio}
Triglycerides: 59 mg/dL (ref <150)
VLDL: 12 mg/dL (ref 0–40)

## 2023-07-11 LAB — HEPARIN LEVEL (UNFRACTIONATED): Heparin Unfractionated: 0.46 [IU]/mL (ref 0.30–0.70)

## 2023-07-11 LAB — APTT: aPTT: 45 s — ABNORMAL HIGH (ref 24–36)

## 2023-07-11 MED ORDER — ROSUVASTATIN CALCIUM 20 MG PO TABS: 20.0000 mg | ORAL_TABLET | Freq: Every evening | ORAL | Status: DC

## 2023-07-11 MED ORDER — ROSUVASTATIN CALCIUM 20 MG PO TABS: 20.0000 mg | ORAL_TABLET | Freq: Every evening | ORAL | 1 refills | Status: DC

## 2023-07-11 MED ORDER — APIXABAN 5 MG PO TABS
5.0000 mg | ORAL_TABLET | Freq: Two times a day (BID) | ORAL | Status: DC
  Administered 2023-07-11: 5 mg via ORAL
  Filled 2023-07-11: qty 1

## 2023-07-11 NOTE — Care Management Obs Status (Signed)
MEDICARE OBSERVATION STATUS NOTIFICATION   Patient Details  Name: Arynn Armand MRN: [id number] Date of Birth: 1945-12-18   Medicare Observation Status Notification Given:  Yes    Gala Lewandowsky, RN 07/11/2023, 11:17 AM

## 2023-07-11 NOTE — Discharge Summary (Signed)
 Discharge Summary    Patient ID: Monica Anderson MRN: [id number]; DOB: Dec 24, 1945  Admit date: 07/10/2023 Discharge date: 07/11/2023  PCP:  Clemmie Nest, MD   [facility] Providers Cardiologist:  Dub Huntsman, DO     Discharge Diagnoses    Principal Problem:   Unstable angina [facility]) Active Problems:   Atrial fibrillation [facility])   Essential hypertension   Mixed hyperlipidemia   Diagnostic Studies/Procedures    Cath: 07/10/2023  Conclusions: Moderate, non-obstructive coronary artery disease with focal and eccentric 50-60% stenosis in the mid LAD that improves with intracoronary nitroglycerin  and is not hemodynamically significant by RFR (0.93).  Minimal plaquing also noted in the proximal/mid RCA. Normal left ventricular filling pressure (LVEDP 14 mmHg).   Recommendations: Continue medical therapy and risk factor modification to prevent progression of coronary artery disease.  Consider addition of standing calcium  channel blocker for treatment of possible vasospasm. Monitor on telemetry overnight to exclude arrhythmia, as the patient has a history of PAF, NSVT, and PSVT that could have contributed to her presenting chest pain. Restart heparin  infusion 2 hours after TR band has been removed.  Transition back to apixaban  tomorrow morning if there is no evidence of bleeding or vascular injury at the catheterization site.   Lonni Hanson, MD [facility]  Diagnostic Dominance: Right  _____________   History of Present Illness     Monica Anderson is a 78 y.o. female with history of apical variant hypertrophic cardiomyopathy, paroxysmal atrial fibrillation on Cardizem  and Eliquis , essential hypertension, hyperlipidemia, Mnire's disease who was seen 07/10/2023 for the evaluation of chest pain.   Ms. Dedeaux presented to an urgent care on 1/31 for evaluation of chest pain and was subsequently sent to the nearest emergency department which was at [facility].  Pain  began while patient was unloading groceries from her car and lasted for approximately 3 hours.  Pain was located substernally and was associated with nausea and shortness of breath.  Initial labs found troponin less than 0.01, proBNP 1840, D-dimer 0.54.  Given her symptoms, patient admitted for further evaluation of her symptoms.  Patient was seen by Dr. Bernie on 07/08/2023.  Per Dr. Karry note, patient in sinus rhythm at the time of her presentation to the emergency department but was found to be in atrial fibrillation at the time of his consult.  Given her symptoms, patient underwent nuclear stress test which showed mild defect involving basal portion of inferior wall.  Stress imaging also showed mild defect involving apical and midportion of the anterior wall as well as basal portion of inferior wall.  Gated images showed akinesis to dyskinesis involving the apical portion of anterior wall.  Given her symptoms, these findings on her stress test were concerning for left anterior descending disease.  Given this abnormal stress test, decision made to transfer patient to [facility] for cardiac catheterization.  Patient transitioned from home Eliquis  to heparin  given plans for ischemic evaluation.   Hospital Course     Exertional chest pain Abnormal stress test -- Patient sent to [facility] emergency department from [facility] in [place] after she presented for evaluation of chest pain that occurred in the setting of exertion and took several hours to resolve.   -- Troponin negative but given concerning nature of symptoms, patient underwent nuclear stress test, which showed mild defect involving basal portion of inferior wall.  Stress imaging also showed mild defect involving apical and midportion of the anterior wall as well as basal portion of inferior  wall.  Gated images showed akinesis to dyskinesis involving the apical portion of anterior wall. (Of note, patient with prior cardiac CTA in 2021  that showed minimal non-obstructive CAD.  -- transferred to [facility] and underwent cardiac cath noted above with 50-60% stenosis of the mLAD, RFR 0.93. Minimal plaque in the p/mRCA. Recommendations for medical therapy -- no ASA with need for Eliquis , continue statin   Hyperlipidemia -- Lipid panel HDL 38, LDL 69 -- Crestor  increased to 20mg  daily at discharge -- will need FLP/LFTs in 8 weeks   Paroxysmal atrial fibrillation -- Patient with history of paroxysmal atrial fibrillation on diltiazem  and Eliquis  was initially in sinus rhythm at outside hospital, subsequently developed atrial fibrillation before having spontaneous conversion back to sinus rhythm. -- remained in sinus rhythm/bradycardia  during admission. Question whether symptoms may have been secondary to PAF vs SVT? Discussed placing monitor to further assess, she prefers to follow up in the office to further discuss.  -- resume Eliquis  5mg  BID -- on diltiazem , coreg  PTA   Hypertension -- on HCTZ/spironolactone , carvedilol  6.25 mg twice daily, Cardizem  CD 240 mg  Of note, she is on farxiga  and Macrobid  as an outpatient. Discussed she should follow up with her PCP regarding concerns for recurrent UTI and SGLT2i therapy.   General: Well developed, well nourished, female appearing in no acute distress. Head: Normocephalic, atraumatic.  Neck: Supple without bruits, JVD. Lungs:  Resp regular and unlabored, CTA. Heart: RRR, S1, S2, no S3, S4, or murmur; no rub. Abdomen: Soft, non-tender, non-distended with normoactive bowel sounds. No hepatomegaly. No rebound/guarding. No obvious abdominal masses. Extremities: No clubbing, cyanosis, edema. Distal pedal pulses are 2+ bilaterally. Right cath site stable without bruising or hematoma Neuro: Alert and oriented X 3. Moves all extremities spontaneously. Psych: Normal affect.   Did the patient have an acute coronary syndrome (MI, NSTEMI, STEMI, etc) this admission?:  No                                Did the patient have a percutaneous coronary intervention (stent / angioplasty)?:  No.        The patient will be scheduled for a TOC follow up appointment in 10-14 days.  A message has been sent to the [facility] and Scheduling Pool at the office where the patient should be seen for follow up.  _____________  Discharge Vitals Blood pressure 132/69, pulse (!) 58, temperature 97.8 F (36.6 C), temperature source Oral, resp. rate 18, height 5' 7 (1.702 m), weight 92.7 kg, SpO2 95%.  Filed Weights   07/10/23 2348 07/11/23 0255  Weight: 92.5 kg 92.7 kg    Labs & Radiologic Studies    CBC Recent Labs    07/11/23 0851  WBC 4.3  HGB 15.1*  HCT 46.2*  MCV 96.0  PLT 167   Basic Metabolic Panel Recent Labs    97/95/74 0851  NA 141  K 3.8  CL 106  CO2 25  GLUCOSE 127*  BUN 12  CREATININE 1.03*  CALCIUM  8.8*   Liver Function Tests No results for input(s): AST, ALT, ALKPHOS, BILITOT, PROT, ALBUMIN in the last 72 hours. No results for input(s): LIPASE, AMYLASE in the last 72 hours. High Sensitivity Troponin:   No results for input(s): TROPONINIHS in the last 720 hours.  BNP Invalid input(s): POCBNP D-Dimer No results for input(s): DDIMER in the last 72 hours. Hemoglobin A1C No results for input(s): HGBA1C  in the last 72 hours. Fasting Lipid Panel Recent Labs    07/11/23 0851  CHOL 119  HDL 38*  LDLCALC 69  TRIG 59  CHOLHDL 3.1   Thyroid  Function Tests No results for input(s): TSH, T4TOTAL, T3FREE, THYROIDAB in the last 72 hours.  Invalid input(s): FREET3 _____________  CARDIAC CATHETERIZATION Result Date: 07/10/2023 Conclusions: Moderate, non-obstructive coronary artery disease with focal and eccentric 50-60% stenosis in the mid LAD that improves with intracoronary nitroglycerin  and is not hemodynamically significant by RFR (0.93).  Minimal plaquing also noted in the proximal/mid RCA. Normal left ventricular filling pressure  (LVEDP 14 mmHg). Recommendations: Continue medical therapy and risk factor modification to prevent progression of coronary artery disease.  Consider addition of standing calcium  channel blocker for treatment of possible vasospasm. Monitor on telemetry overnight to exclude arrhythmia, as the patient has a history of PAF, NSVT, and PSVT that could have contributed to her presenting chest pain. Restart heparin  infusion 2 hours after TR band has been removed.  Transition back to apixaban  tomorrow morning if there is no evidence of bleeding or vascular injury at the catheterization site. Lonni Hanson, MD [facility]  Disposition   Pt is being discharged home today in good condition.  Follow-up Plans & Appointments     Discharge Instructions     Call MD for:  difficulty breathing, headache or visual disturbances   Complete by: As directed    Call MD for:  persistant dizziness or light-headedness   Complete by: As directed    Call MD for:  redness, tenderness, or signs of infection (pain, swelling, redness, odor or green/yellow discharge around incision site)   Complete by: As directed    Diet - low sodium heart healthy   Complete by: As directed    Discharge instructions   Complete by: As directed    Radial Site Care Refer to this sheet in the next few weeks. These instructions provide you with information on caring for yourself after your procedure. Your caregiver may also give you more specific instructions. Your treatment has been planned according to current medical practices, but problems sometimes occur. Call your caregiver if you have any problems or questions after your procedure. HOME CARE INSTRUCTIONS You may shower the day after the procedure. Remove the bandage (dressing) and gently wash the site with plain soap and water. Gently pat the site dry.  Do not apply powder or lotion to the site.  Do not submerge the affected site in water for 3 to 5 days.  Inspect the site at least  twice daily.  Do not flex or bend the affected arm for 24 hours.  No lifting over 5 pounds (2.3 kg) for 5 days after your procedure.  Do not drive home if you are discharged the same day of the procedure. Have someone else drive you.  You may drive 24 hours after the procedure unless otherwise instructed by your caregiver.  What to expect: Any bruising will usually fade within 1 to 2 weeks.  Blood that collects in the tissue (hematoma) may be painful to the touch. It should usually decrease in size and tenderness within 1 to 2 weeks.  SEEK IMMEDIATE MEDICAL CARE IF: You have unusual pain at the radial site.  You have redness, warmth, swelling, or pain at the radial site.  You have drainage (other than a small amount of blood on the dressing).  You have chills.  You have a fever or persistent symptoms for more than 72 hours.  You have a fever and your symptoms suddenly get worse.  Your arm becomes pale, cool, tingly, or numb.  You have heavy bleeding from the site. Hold pressure on the site.   Increase activity slowly   Complete by: As directed         Discharge Medications   Allergies as of 07/11/2023   No Known Allergies      Medication List     TAKE these medications    buPROPion  150 MG 24 hr tablet Commonly known as: WELLBUTRIN  XL Take 150 mg by mouth in the morning.   CALCIUM  600+D3 PO Take 1 tablet by mouth in the morning.   carvedilol  6.25 MG tablet Commonly known as: COREG  Take 1 tablet (6.25 mg total) by mouth 2 (two) times daily.   diltiazem  240 MG 24 hr capsule Commonly known as: CARDIZEM  CD TAKE 1 CAPSULE BY MOUTH EVERY DAY   Eliquis  5 MG Tabs tablet Generic drug: apixaban  Take 5 mg by mouth 2 (two) times daily.   estradiol 0.1 MG/GM vaginal cream Commonly known as: ESTRACE Place 1 Applicatorful vaginally every Monday, Wednesday, and Friday at 8 PM.   famotidine  20 MG tablet Commonly known as: PEPCID  Take 20 mg by mouth at bedtime.   Farxiga  5  MG Tabs tablet Generic drug: dapagliflozin  propanediol Take 5 mg by mouth in the morning.   ketoconazole 2 % cream Commonly known as: NIZORAL Apply 1 Application topically 2 (two) times daily as needed (irritation.).   montelukast  10 MG tablet Commonly known as: SINGULAIR  Take 10 mg by mouth at bedtime.   nitrofurantoin  (macrocrystal-monohydrate) 100 MG capsule Commonly known as: MACROBID  Take 100 mg by mouth daily.   Omega-3 Fish Oil 1200 MG Caps Take 1,200 mg by mouth every evening.   PRESERVISION AREDS 2 PO Take 1 tablet by mouth in the morning.   rosuvastatin  20 MG tablet Commonly known as: CRESTOR  Take 1 tablet (20 mg total) by mouth every evening. What changed:  medication strength how much to take   spironolactone -hydrochlorothiazide  25-25 MG tablet Commonly known as: ALDACTAZIDE Take 0.5 tablets by mouth in the morning.   vitamin D3 50 MCG (2000 UT) Caps Take 1 capsule by mouth daily.           Outstanding Labs/Studies   FLP/LFTs in 8 weeks  Duration of Discharge Encounter: APP Time: 20 minutes   Signed, Manuelita Rummer, NP 07/11/2023, 11:10 AM

## 2023-07-11 NOTE — TOC CM/SW Note (Signed)
 Transition of Care [facility]) - Inpatient Brief Assessment   Patient Details  Name: Monica Anderson MRN: [id number] Date of Birth: 06-27-1945  Transition of Care [facility]) CM/SW Contact:    Doneta Glenys DASEN, RN Phone Number: 07/11/2023, 11:23 AM   Clinical Narrative: Patient present for unstable angina. PTA patient reports that she was independent at home with spouse Georgean) and son Bronwen). Patient has insurance and PCP. DME (walker, cane, & bedside commode). No home needs identified during the visit. Patient reports that spouse will be transportation home via private vehicle. MOON completed.   Transition of Care Asessment: Insurance and Status: Insurance coverage has been reviewed Patient has primary care physician: Yes Home environment has been reviewed: Yes Prior level of function:: Independent Prior/Current Home Services: No current home services Social Drivers of Health Review: SDOH reviewed no interventions necessary Readmission risk has been reviewed: Yes Transition of care needs: no transition of care needs at this time

## 2023-07-17 DIAGNOSIS — R9431 Abnormal electrocardiogram [ECG] [EKG]: Secondary | ICD-10-CM | POA: Diagnosis not present

## 2023-07-17 DIAGNOSIS — J209 Acute bronchitis, unspecified: Secondary | ICD-10-CM | POA: Diagnosis not present

## 2023-07-17 DIAGNOSIS — I517 Cardiomegaly: Secondary | ICD-10-CM | POA: Diagnosis not present

## 2023-07-17 DIAGNOSIS — I10 Essential (primary) hypertension: Secondary | ICD-10-CM | POA: Diagnosis not present

## 2023-07-17 DIAGNOSIS — J101 Influenza due to other identified influenza virus with other respiratory manifestations: Secondary | ICD-10-CM | POA: Diagnosis not present

## 2023-07-17 DIAGNOSIS — I4891 Unspecified atrial fibrillation: Secondary | ICD-10-CM | POA: Diagnosis not present

## 2023-07-21 DIAGNOSIS — J111 Influenza due to unidentified influenza virus with other respiratory manifestations: Secondary | ICD-10-CM | POA: Diagnosis not present

## 2023-07-21 DIAGNOSIS — I422 Other hypertrophic cardiomyopathy: Secondary | ICD-10-CM | POA: Diagnosis not present

## 2023-07-21 DIAGNOSIS — I480 Paroxysmal atrial fibrillation: Secondary | ICD-10-CM | POA: Diagnosis not present

## 2023-07-21 DIAGNOSIS — I200 Unstable angina: Secondary | ICD-10-CM | POA: Diagnosis not present

## 2023-07-28 ENCOUNTER — Other Ambulatory Visit: Payer: Self-pay

## 2023-07-28 ENCOUNTER — Ambulatory Visit (INDEPENDENT_AMBULATORY_CARE_PROVIDER_SITE_OTHER): Payer: Medicare Other

## 2023-07-28 ENCOUNTER — Encounter: Payer: Self-pay | Admitting: Nurse Practitioner

## 2023-07-28 ENCOUNTER — Ambulatory Visit: Payer: Medicare Other | Attending: Nurse Practitioner | Admitting: Nurse Practitioner

## 2023-07-28 VITALS — BP 118/68 | HR 65 | Ht 66.0 in | Wt 208.0 lb

## 2023-07-28 DIAGNOSIS — I480 Paroxysmal atrial fibrillation: Secondary | ICD-10-CM

## 2023-07-28 DIAGNOSIS — I2510 Atherosclerotic heart disease of native coronary artery without angina pectoris: Secondary | ICD-10-CM

## 2023-07-28 DIAGNOSIS — I4729 Other ventricular tachycardia: Secondary | ICD-10-CM

## 2023-07-28 DIAGNOSIS — I10 Essential (primary) hypertension: Secondary | ICD-10-CM | POA: Insufficient documentation

## 2023-07-28 DIAGNOSIS — I4892 Unspecified atrial flutter: Secondary | ICD-10-CM

## 2023-07-28 DIAGNOSIS — E785 Hyperlipidemia, unspecified: Secondary | ICD-10-CM

## 2023-07-28 DIAGNOSIS — I422 Other hypertrophic cardiomyopathy: Secondary | ICD-10-CM

## 2023-07-28 NOTE — Patient Instructions (Addendum)
Medication Instructions:  Your physician recommends that you continue on your current medications as directed. Please refer to the Current Medication list given to you today.  *If you need a refill on your cardiac medications before your next appointment, please call your pharmacy*   Lab Work: Your physician recommends that you return for lab work in 2 months. CMET & fasting lipid panel   Testing/Procedures: ZIO XT- Long Term Monitor Instructions  Your physician has requested you wear a ZIO patch monitor for 7 days.  This is a single patch monitor. [employer] supplies one patch monitor per enrollment. Additional stickers are not available. Please do not apply patch if you will be having a Nuclear Stress Test,  Echocardiogram, Cardiac CT, MRI, or Chest Xray during the period you would be wearing the  monitor. The patch cannot be worn during these tests. You cannot remove and re-apply the  ZIO XT patch monitor.  Your ZIO patch monitor will be mailed 3 day USPS to your address on file. It may take 3-5 days  to receive your monitor after you have been enrolled.  Once you have received your monitor, please review the enclosed instructions. Your monitor  has already been registered assigning a specific monitor serial # to you.  Billing and Patient Assistance Program Information  We have supplied [employer] with any of your insurance information on file for billing purposes. [employer] offers a sliding scale Patient Assistance Program for patients that do not have  insurance, or whose insurance does not completely cover the cost of the ZIO monitor.  You must apply for the Patient Assistance Program to qualify for this discounted rate.  To apply, please call [employer] at [phone], select option 4, select option 2, ask to apply for  Patient Assistance Program. Meredeth Ide will ask your household income, and how many people  are in your household. They will quote your out-of-pocket cost based on that  information.  [employer] will also be able to set up a 69-month, interest-free payment plan if needed.  Applying the monitor   Shave hair from upper left chest.  Hold abrader disc by orange tab. Rub abrader in 40 strokes over the upper left chest as  indicated in your monitor instructions.  Clean area with 4 enclosed alcohol pads. Let dry.  Apply patch as indicated in monitor instructions. Patch will be placed under collarbone on left  side of chest with arrow pointing upward.  Rub patch adhesive wings for 2 minutes. Remove white label marked "1". Remove the white  label marked "2". Rub patch adhesive wings for 2 additional minutes.  While looking in a mirror, press and release button in center of patch. A small green light will  flash 3-4 times. This will be your only indicator that the monitor has been turned on.  Do not shower for the first 24 hours. You may shower after the first 24 hours.  Press the button if you feel a symptom. You will hear a small click. Record Date, Time and  Symptom in the Patient Logbook.  When you are ready to remove the patch, follow instructions on the last 2 pages of Patient  Logbook. Stick patch monitor onto the last page of Patient Logbook.  Place Patient Logbook in the blue and white box. Use locking tab on box and tape box closed  securely. The blue and white box has prepaid postage on it. Please place it in the mailbox as  soon as possible. Your physician should have your  test results approximately 7 days after the  monitor has been mailed back to [facility].  Call [facility] Customer Care at [phone] if you have questions regarding  your ZIO XT patch monitor. Call them immediately if you see an orange light blinking on your  monitor.  If your monitor falls off in less than 4 days, contact our Monitor department at [phone].  If your monitor becomes loose or falls off after 4 days call [employer] at [phone] for  suggestions on  securing your monitor   Follow-Up: At [facility], you and your health needs are our priority.  As part of our continuing mission to provide you with exceptional heart care, we have created designated Provider Care Teams.  These Care Teams include your primary Cardiologist (physician) and Advanced Practice Providers (APPs -  Physician Assistants and Nurse Practitioners) who all work together to provide you with the care you need, when you need it.  We recommend signing up for the patient portal called "MyChart".  Sign up information is provided on this After Visit Summary.  MyChart is used to connect with patients for Virtual Visits (Telemedicine).  Patients are able to view lab/test results, encounter notes, upcoming appointments, etc.  Non-urgent messages can be sent to your provider as well.   To learn more about what you can do with MyChart, go to [email or web address].    Your next appointment:   3 month(s)  Provider:   Thomasene Ripple, DO     Other Instructions

## 2023-07-28 NOTE — Progress Notes (Signed)
 Office Visit    Patient Name: Monica Anderson Date of Encounter: 07/28/2023  Primary Care Provider:  Buckner Malta, MD Primary Cardiologist:  Thomasene Ripple, DO  Chief Complaint    78 year old female with a history of CAD, apical variant hypertrophic cardiomyopathy (noted on echo and confirmed on cardiac MR), paroxysmal atrial fibrillation diagnosed in 2015 on diltiazem and Eliquis, hypertension, hyperlipidemia, Mnire's disease, and BPPV who presents for hospital follow-up related to CAD and atrial fibrillation.   Past Medical History    Past Medical History:  Diagnosis Date   Anemia    Arthritis    Asymmetrical left sensorineural hearing loss 10/01/2019   Atrial fibrillation (HCC) 10/02/2019   Essential hypertension 10/03/2019   History of colon polyps    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, left (HCC) 01/17/2017   Meniere's disease 08/29/2019   with hearing loss in Lt ear   Mixed hyperlipidemia 10/03/2019   Multiple dysplastic nevi    Other hypertrophic cardiomyopathy (HCC)    Pleural effusion    Past Surgical History:  Procedure Laterality Date   APPENDECTOMY  1958   BREAST BIOPSY  1973   CATARACT EXTRACTION, BILATERAL  2019   COLONOSCOPY  11/07/2016   Colonic polyp status post polypectomy. Minimal sigmoid diverticulosis   CORONARY PRESSURE/FFR STUDY N/A 07/10/2023   Procedure: CORONARY PRESSURE/FFR STUDY;  Surgeon: Yvonne Kendall, MD;  Location: [facility];  Service: Cardiovascular;  Laterality: N/A;   DILATION AND CURETTAGE OF UTERUS  1968   LEFT HEART CATH AND CORONARY ANGIOGRAPHY N/A 07/10/2023   Procedure: LEFT HEART CATH AND CORONARY ANGIOGRAPHY;  Surgeon: Yvonne Kendall, MD;  Location: [facility];  Service: Cardiovascular;  Laterality: N/A;   MOHS SURGERY Left 2019   Melanoma on left ear   TOTAL KNEE ARTHROPLASTY Right 07/18/2022   Procedure: TOTAL KNEE ARTHROPLASTY;  Surgeon: Ollen Gross, MD;  Location: [facility];   Service: Orthopedics;  Laterality: Right;   TUBAL LIGATION  1977    Allergies  No Known Allergies   Labs/Other Studies Reviewed    The following studies were reviewed today:  Cardiac Studies & Procedures   ______________________________________________________________________________________________ CARDIAC CATHETERIZATION  CARDIAC CATHETERIZATION 07/10/2023  Narrative Conclusions: Moderate, non-obstructive coronary artery disease with focal and eccentric 50-60% stenosis in the mid LAD that improves with intracoronary nitroglycerin and is not hemodynamically significant by RFR (0.93).  Minimal plaquing also noted in the proximal/mid RCA. Normal left ventricular filling pressure (LVEDP 14 mmHg).  Recommendations: Continue medical therapy and risk factor modification to prevent progression of coronary artery disease.  Consider addition of standing calcium channel blocker for treatment of possible vasospasm. Monitor on telemetry overnight to exclude arrhythmia, as the patient has a history of PAF, NSVT, and PSVT that could have contributed to her presenting chest pain. Restart heparin infusion 2 hours after TR band has been removed.  Transition back to apixaban tomorrow morning if there is no evidence of bleeding or vascular injury at the catheterization site.  Yvonne Kendall, MD [facility]  Findings Coronary Findings Diagnostic  Dominance: Right  Left Main Vessel is large. Vessel is angiographically normal.  Left Anterior Descending Vessel is large. Mid LAD lesion is 55% stenosed. The lesion is focal and eccentric. Pressure gradient was performed on the lesion. RFR: 0.93.  First Diagonal Branch Vessel is large in size.  Left Circumflex Vessel is large. Vessel is angiographically normal.  First Obtuse Marginal Branch Vessel is moderate in size.  Second Obtuse Marginal  Branch Vessel is moderate in size.  Third Obtuse Marginal Branch Vessel is moderate in  size.  Right Coronary Artery Vessel is large. Prox RCA to Mid RCA lesion is 10% stenosed.  Right Posterior Descending Artery Vessel is moderate in size.  Right Posterior Atrioventricular Artery Vessel is small in size.  First Right Posterolateral Branch Vessel is small in size.  Second Right Posterolateral Branch Vessel is small in size.  Third Right Posterolateral Branch Vessel is small in size.  Intervention  No interventions have been documented.     ECHOCARDIOGRAM  ECHOCARDIOGRAM COMPLETE 10/18/2019  Narrative ECHOCARDIOGRAM REPORT    Patient Name:   Monica Anderson Date of Exam: 10/18/2019 Medical Rec #:  [id number]       Height:       66.5 in Accession #:    [id number]      Weight:       184.0 lb Date of Birth:  1945-08-25       BSA:          1.941 m Patient Age:    73 years        BP:           98/60 mmHg Patient Gender: F               HR:           58 bpm. Exam Location:  [place]  Procedure: 2D Echo and Intracardiac Opacification Agent  Indications:    Atrial Fibrillation 427.31 / I48.91  History:        Patient has no prior history of Echocardiogram examinations. Risk Factors:Hypertension and Dyslipidemia.  Sonographer:    Louie Boston Referring Phys: [id number] KARDIE TOBB  IMPRESSIONS   1. Left ventricular ejection fraction, by estimation, is 60 to 65%. The left ventricle has normal function. The left ventricle has no regional wall motion abnormalities. There is moderate concentric left ventricular hypertrophy. There is concern for apical variant of hypertrophic cardiomyopathy, with post definity images also suggestive of Apical variant of HCM. Further testing with cardiac MRI is recommended. 2. Left ventricular diastolic parameters are consistent with Grade I diastolic dysfunction (impaired relaxation). 3. Right ventricular systolic function is normal. The right ventricular size is normal. There is normal pulmonary artery systolic pressure. 4. The  mitral valve is normal in structure. No evidence of mitral valve regurgitation. No evidence of mitral stenosis. 5. Tricuspid valve regurgitation is moderate. 6. The aortic valve is normal in structure. Aortic valve regurgitation is not visualized. No aortic stenosis is present. 7. The inferior vena cava is normal in size with greater than 50% respiratory variability, suggesting right atrial pressure of 3 mmHg.  Comparison(s): No prior Echocardiogram.  FINDINGS Left Ventricle: There is concern for apical variant of hypertrophic cardiomyopathy, with post definity images also suggestive of Apical variant of HCM. Left ventricular ejection fraction, by estimation, is 60 to 65%. The left ventricle has normal function. The left ventricle has no regional wall motion abnormalities. Definity contrast agent was given IV to delineate the left ventricular endocardial borders. The left ventricular internal cavity size was normal in size. There is moderate concentric left ventricular hypertrophy. Left ventricular diastolic parameters are consistent with Grade I diastolic dysfunction (impaired relaxation).  Right Ventricle: The right ventricular size is normal. No increase in right ventricular wall thickness. Right ventricular systolic function is normal. There is normal pulmonary artery systolic pressure. The tricuspid regurgitant velocity is 2.51 m/s, and with an assumed right atrial pressure of 3 mmHg,  the estimated right ventricular systolic pressure is 28.2 mmHg.  Left Atrium: Left atrial size was normal in size.  Right Atrium: Right atrial size was normal in size.  Pericardium: There is no evidence of pericardial effusion.  Mitral Valve: The mitral valve is normal in structure. Normal mobility of the mitral valve leaflets. No evidence of mitral valve regurgitation. No evidence of mitral valve stenosis.  Tricuspid Valve: The tricuspid valve is normal in structure. Tricuspid valve regurgitation is  moderate . No evidence of tricuspid stenosis.  Aortic Valve: The aortic valve is normal in structure. Aortic valve regurgitation is not visualized. No aortic stenosis is present.  Pulmonic Valve: The pulmonic valve was normal in structure. Pulmonic valve regurgitation is not visualized. No evidence of pulmonic stenosis.  Aorta: The aortic root is normal in size and structure.  Venous: The inferior vena cava is normal in size with greater than 50% respiratory variability, suggesting right atrial pressure of 3 mmHg.  IAS/Shunts: No atrial level shunt detected by color flow Doppler.   LEFT VENTRICLE PLAX 2D LVIDd:         4.40 cm  Diastology LVIDs:         2.90 cm  LV e' lateral:   5.33 cm/s LV PW:         1.40 cm  LV E/e' lateral: 10.7 LV IVS:        1.40 cm  LV e' medial:    4.90 cm/s LVOT diam:     2.30 cm  LV E/e' medial:  11.7 LV SV:         83 LV SV Index:   43 LVOT Area:     4.15 cm   RIGHT VENTRICLE            IVC RV S prime:     9.90 cm/s  IVC diam: 1.50 cm TAPSE (M-mode): 2.0 cm  LEFT ATRIUM             Index       RIGHT ATRIUM           Index LA diam:        4.30 cm 2.22 cm/m  RA Area:     14.50 cm LA Vol (A2C):   41.2 ml 21.23 ml/m RA Volume:   32.60 ml  16.80 ml/m LA Vol (A4C):   42.7 ml 22.00 ml/m LA Biplane Vol: 42.0 ml 21.64 ml/m AORTIC VALVE LVOT Vmax:   87.90 cm/s LVOT Vmean:  47.800 cm/s LVOT VTI:    0.199 m  AORTA Ao Root diam: 3.40 cm  MITRAL VALVE               TRICUSPID VALVE MV Area (PHT): 2.87 cm    TR Peak grad:   25.2 mmHg MV Decel Time: 264 msec    TR Vmax:        251.00 cm/s MV E velocity: 57.20 cm/s MV A velocity: 78.60 cm/s  SHUNTS MV E/A ratio:  0.73        Systemic VTI:  0.20 m Systemic Diam: 2.30 cm  Kardie Tobb DO Electronically signed by Thomasene Ripple DO Signature Date/Time: 10/18/2019/11:54:36 AM    Final    MONITORS  LONG TERM MONITOR (3-14 DAYS) 02/06/2020  Narrative The patient wore the monitor for 13 days 22  hours starting January 07, 2020. Indication: Dizziness  The minimum heart rate was 46 bpm, maximum heart rate was 158 bpm, and average heart rate was 69 bpm. Predominant underlying rhythm  was Sinus Rhythm.  Idioventricular Rhythm was present.  4 Ventricular Tachycardia runs occurred, the run with the fastest interval lasting 14.7 secs with a maximum rate of 152 bpm (average 113 bpm); the run with the fastest interval was also the longest.  36 Supraventricular Tachycardia runs occurred, the run with the fastest interval lasting 4 beats with a maximum rate of 158 bpm, the longest lasting 14.3 secs with an average rate of 109 bpm.  Premature atrial complexes were rare (<1.0%). Premature Ventricular complexes were rare (<1.0%).  No pauses, No AV block and no atrial fibrillation present. No patient triggered events or diary events were noted.  Conclusion: This study is remarkable for the following: 1.  Nonsustained ventricular tachycardia. 2.  Paroxysmal supraventricular tachycardia which is likely atrial tachycardia with variable block.   CT SCANS  CT CORONARY MORPH W/CTA COR W/SCORE 03/09/2020  Addendum 03/09/2020  9:13 PM ADDENDUM REPORT: 03/09/2020 21:10  CLINICAL DATA:  36F with NSVT  EXAM: Cardiac/Coronary CTA  TECHNIQUE: The patient was scanned on a [employer].  FINDINGS: A 100 kV prospective scan was triggered in the descending thoracic aorta at 111 HU's. Axial non-contrast 3 mm slices were carried out through the heart. The data set was analyzed on a dedicated work station and scored using the Agatson method. Gantry rotation speed was 250 msecs and collimation was .6 mm. 0.8 mg of sl NTG was given. The 3D data set was reconstructed in 5% intervals of the 35-75% of the R-R cycle. Phases were analyzed on a dedicated work station using MPR, MIP and VRT modes. The patient received 80 cc of contrast.  Coronary Arteries:  Normal coronary origin.  Right  dominance.  RCA is a large dominant artery that gives rise to PDA and PLA. There is no plaque.  Left main is a large artery that gives rise to LAD, ramus, and LCX arteries.  LAD is a large vessel. There is calcified plaque in the mid LAD causing 0-24%. There is calcified plaque in the mid D1 causing 0-24% stenosis  LCX is a non-dominant artery that gives rise to one large OM1 branch. There is no plaque.  There is a small ramus that has no plaque.  Other findings:  Left Ventricle: Normal size.  Apical hypertrophy  Left Atrium: Mild enlargement  Pulmonary Veins: Normal configuration  Right Ventricle: Normal size  Right Atrium: Mild enlargement  Cardiac valves: Mild AV calcifications  Thoracic aorta: Normal size  Pulmonary Arteries: Normal size  Systemic Veins: Normal drainage  Pericardium: Normal thickness  IMPRESSION: 1. Coronary calcium score of 3. This was 31st percentile for age and sex matched control.  2. Normal coronary origin with right dominance.  3. Nonobstructive CAD with calcified plaque in the mid LAD and mid D1 causing minimal (0-24%) stenosis  CAD-RADS 1. Minimal non-obstructive CAD (0-24%). Consider non-atherosclerotic causes of chest pain. Consider preventive therapy and risk factor modification.   Electronically Signed By: Epifanio Lesches MD On: 03/09/2020 21:10  Narrative EXAM: OVER-READ INTERPRETATION  CT CHEST  The following report is an over-read performed by radiologist Dr. Jeronimo Greaves of [facility] on 03/09/2020. This over-read does not include interpretation of cardiac or coronary anatomy or pathology. The coronary CTA interpretation by the cardiologist is attached.  COMPARISON:  08/17/2015 chest radiograph. 03/13/2014 CTA chest. Both from [facility].  FINDINGS: Vascular: Aortic atherosclerosis. Tortuous thoracic aorta. No central pulmonary embolism, on this non-dedicated  study.  Mediastinum/Nodes: No imaged thoracic adenopathy.  Lungs/Pleura: No  pleural fluid.  Clear imaged lungs.  Upper Abdomen: Normal imaged portions of the liver, spleen, stomach.  Musculoskeletal: Moderate thoracic spondylosis.  IMPRESSION: 1. No acute findings in the imaged extracardiac chest. 2. Aortic Atherosclerosis (ICD10-I70.0).  Electronically Signed: By: Jeronimo Greaves M.D. On: 03/09/2020 14:10   CARDIAC MRI  MR CARDIAC MORPHOLOGY W WO CONTRAST 12/25/2019  Narrative CLINICAL DATA:  80F evaluate for apical HCM  EXAM: CARDIAC MRI  TECHNIQUE: The patient was scanned on a 1.5 Tesla Siemens magnet. A dedicated cardiac coil was used. Functional imaging was done using [employer] sequences. 2,3, and 4 chamber views were done to assess for RWMA's. Modified Simpson's rule using a short axis stack was used to calculate an ejection fraction on a dedicated work [employer]. The patient received 8 cc of Gadavist. After 10 minutes inversion recovery sequences were used to assess for infiltration and scar tissue.  CONTRAST:  8 cc  of Gadavist  FINDINGS: Left ventricle:  -Apical hypertrophy measuring up to 15mm (7mm in basal posterior wall)  -Normal size  -Hyperdynamic systolic function  -Normal ECV (27%, assuming hematocrit 40%)  -No LGE  LV EF:  74% (Normal 56-78%)  Absolute volumes:  LV EDV: (Normal 52-141 mL)  LV ESV: 31mL (Normal 13-51 mL)  LV SV: 87mL (Normal 33-97 mL)  CO: 4.9L/min (Normal 2.7-6.0 L/min)  Indexed volumes:  LV EDV: 38mL/sq-m (Normal 41-81 mL/sq-m)  LV ESV: 45mL/sq-m (Normal 12-21 mL/sq-m)  LV SV: 18mL/sq-m (Normal 26-56 mL/sq-m)  CI: 2.6L/min/sq-m (Normal 1.8-3.8 L/min/sq-m)  Right ventricle: Normal size and systolic function  RV EF: 65% (Normal 47-80%)  Absolute volumes:  RV EDV: (Normal 58-154 mL)  RV ESV: 53mL (Normal 12-68 mL)  RV SV: 97mL (Normal 35-98 mL)  CO: 5.5L/min (Normal 2.7-6  L/min)  Indexed volumes:  RV EDV: 61mL/sq-m (Normal 48-87 mL/sq-m)  RV ESV: 30mL/sq-m (Normal 11-28 mL/sq-m)  RV SV: 35mL/sq-m (Normal 27-57 mL/sq-m)  CI: 2.8L/min/sq-m (Normal 1.8-3.8 L/min/sq-m)  Left atrium: Mild enlargement  Right atrium: Mild enlargement  Mitral valve: No regurgitation  Aortic valve: Tricuspid.  No regurgitation  Tricuspid valve: Mild regurgitation  Pulmonic valve: No regurgitation  Aorta: Normal proximal ascending aorta  Pericardium: Small pericardial effusion measures up to 9mm adjacent to LV inferior wall  IMPRESSION: 1. LV apical hypertrophy measuring up to 15mm (basal posterior wall measures 7mm), consistent with apical hypertrophic cardiomyopathy  2.  No late gadolinium enhancement to suggest myocardial scar  3.  Normal LV size with hyperdynamic systolic function (EF 74%)  4.  Normal RV size and systolic function (EF 65%)  5. Small pericardial effusion measures up to 9mm adjacent to LV inferior wall   Electronically Signed By: Epifanio Lesches MD On: 12/25/2019 22:09   ______________________________________________________________________________________________     Recent Labs: 07/11/2023: BUN 12; Creatinine, Ser 1.03; Hemoglobin 15.1; Platelets 167; Potassium 3.8; Sodium 141  Recent Lipid Panel    Component Value Date/Time   CHOL 119 07/11/2023 0851   TRIG 59 07/11/2023 0851   HDL 38 (L) 07/11/2023 0851   CHOLHDL 3.1 07/11/2023 0851   VLDL 12 07/11/2023 0851   LDLCALC 69 07/11/2023 0851    History of Present Illness    78 year old female with the above past medical history including CAD, apical variant hypertrophic cardiomyopathy (noted on echo and confirmed on cardiac MR), paroxysmal atrial fibrillation diagnosed in 2015 on diltiazem and Eliquis, hypertension, hyperlipidemia, Mnire's disease, and BPPV.   She has a history of apical variant hypertrophic cardiomyopathy.  She was evaluated by EP in 03/2020 and based  on all the information reviewed it was recommended she hold off on pursuing ICD.  Patient was in agreement with this.  Coronary CT angiogram in 2021 showed minimal nonobstructive CAD.  She was hospitalized in 03/2022 in the setting of A-fib with RVR.  Echocardiogram was normal. Diltiazem was increased to 240 mg daily. She was last seen in the office on 11/04/2022 and was stable from a cardiac standpoint.  She presented to [facility] emergency department from [facility] med center in [place] in January 2025 for the evaluation of chest pain.  Troponin was negative. She underwent nuclear stress test which showed mild defect involving the basal portion of the inferior wall and apical midportion of the anterior wall.  She was transferred to [facility] and underwent cardiac catheterization on 07/10/2023, which revealed 50 to 60% stenosis of the mid LAD, IFR 0.93, minimal plaque in the proximal to mid RCA.  Medical therapy was recommended.  Crestor was increased to 20 mg daily.  She was noted to be in atrial fibrillation upon arrival to the hospital, however, she spontaneously converted to sinus rhythm. She was discharged home in stable condition on 07/11/2023.  She presents today for follow-up.  Since her hospitalization she notes that she returned to [facility] on 07/16/2022 and was positive for influenza A. She has since noted ongoing shortness of breath, fatigue, decreased activity tolerance. She denies chest pain, palpitations, dizziness, edema, PND, orthopnea, weight gain. EKG today shows rate controlled atrial flutter.   Home Medications    Current Outpatient Medications  Medication Sig Dispense Refill   buPROPion (WELLBUTRIN XL) 150 MG 24 hr tablet Take 150 mg by mouth in the morning.     Calcium Carb-Cholecalciferol (CALCIUM 600+D3 PO) Take 1 tablet by mouth in the morning.     carvedilol (COREG) 6.25 MG tablet Take 1 tablet (6.25 mg total) by mouth 2 (two) times daily. 180 tablet 3   Cholecalciferol (VITAMIN  D3) 50 MCG (2000 UT) CAPS Take 1 capsule by mouth daily.     diltiazem (CARDIZEM CD) 240 MG 24 hr capsule TAKE 1 CAPSULE BY MOUTH EVERY DAY 90 capsule 1   ELIQUIS 5 MG TABS tablet Take 5 mg by mouth 2 (two) times daily.  3   estradiol (ESTRACE) 0.1 MG/GM vaginal cream Place 1 Applicatorful vaginally every Monday, Wednesday, and Friday at 8 PM.     famotidine (PEPCID) 20 MG tablet Take 20 mg by mouth at bedtime.     FARXIGA 5 MG TABS tablet Take 5 mg by mouth in the morning.     ketoconazole (NIZORAL) 2 % cream Apply 1 Application topically 2 (two) times daily as needed (irritation.).     montelukast (SINGULAIR) 10 MG tablet Take 10 mg by mouth at bedtime.     Multiple Vitamins-Minerals (PRESERVISION AREDS 2 PO) Take 1 tablet by mouth in the morning.     nitrofurantoin, macrocrystal-monohydrate, (MACROBID) 100 MG capsule Take 100 mg by mouth daily.     Omega-3 Fatty Acids (OMEGA-3 FISH OIL) 1200 MG CAPS Take 1,200 mg by mouth every evening.     rosuvastatin (CRESTOR) 20 MG tablet Take 1 tablet (20 mg total) by mouth every evening. 90 tablet 1   spironolactone-hydrochlorothiazide (ALDACTAZIDE) 25-25 MG tablet Take 0.5 tablets by mouth in the morning.     No current facility-administered medications for this visit.     Review of Systems    She denies chest pain, palpitations,  pnd, orthopnea, n, v, dizziness, syncope, edema, weight gain, or early satiety. All other systems reviewed and are otherwise negative except as noted above.   Physical Exam    VS:  BP 118/68   Pulse 65   Ht 5\' 6"  (1.676 m)   Wt 208 lb (94.3 kg)   LMP  (LMP Unknown)   SpO2 96%   BMI 33.57 kg/m  GEN: Well nourished, well developed, in no acute distress. HEENT: normal. Neck: Supple, no JVD, carotid bruits, or masses. Cardiac: IRIR, no murmurs, rubs, or gallops. No clubbing, cyanosis, edema.  Radials/DP/PT 2+ and equal bilaterally.  Respiratory:  Respirations regular and unlabored, clear to auscultation  bilaterally. GI: Soft, nontender, nondistended, BS + x 4. MS: no deformity or atrophy. Skin: warm and dry, no rash. Neuro:  Strength and sensation are intact. Psych: Normal affect.  Accessory Clinical Findings    ECG personally reviewed by me today - EKG Interpretation Date/Time:  Friday July 28 2023 14:16:58 EST Ventricular Rate:  65 PR Interval:    QRS Duration:  94 QT Interval:  422 QTC Calculation: 438 R Axis:   32  Text Interpretation: Atrial flutter Left ventricular hypertrophy with repolarization abnormality ( Sokolow-Lyon , Romhilt-Estes ) When compared with ECG of 10-Jul-2023 17:52, Atrial flutter has replaced Sinus rhythm Confirmed by Bernadene Person ([id number]) on 07/28/2023 2:59:19 PM  - no acute changes.   Lab Results  Component Value Date   WBC 4.3 07/11/2023   HGB 15.1 (H) 07/11/2023   HCT 46.2 (H) 07/11/2023   MCV 96.0 07/11/2023   PLT 167 07/11/2023   Lab Results  Component Value Date   CREATININE 1.03 (H) 07/11/2023   BUN 12 07/11/2023   NA 141 07/11/2023   K 3.8 07/11/2023   CL 106 07/11/2023   CO2 25 07/11/2023   No results found for: "ALT", "AST", "GGT", "ALKPHOS", "BILITOT" Lab Results  Component Value Date   CHOL 119 07/11/2023   HDL 38 (L) 07/11/2023   LDLCALC 69 07/11/2023   TRIG 59 07/11/2023   CHOLHDL 3.1 07/11/2023    No results found for: "HGBA1C"  Assessment & Plan    1. CAD/shortness of breath/fatigue: Coronary CT angiogram in 2021 showed minimal nonobstructive CAD. Cardiac catheterization on 2/3/202 revealed 50 to 60% stenosis of the mid LAD, IFR 0.93, minimal plaque in the proximal to mid RCA.  Medical therapy was recommended.  She denies chest pain though she does note shortness of breath, fatigue, decreased activity tolerance. She states she had an echocardiogram completed during her recent hospitalization at [facility], no significant findings per patient, unfortunately, records not available for review. Question if paroxysmal  atrial fibrillation is driving symptoms of shortness of breath, fatigue.  Will pursue cardiac monitor as below. Crestor was recently increased, it is possible this is contributing to fatigue. Additionally, possible recent viral infection could be contributing to her shortness of breath, recommend follow-up with PCP.  No ASA in the setting of chronic DOAC therapy.  Continue carvedilol, diltiazem, spironolactone-HCTZ, and Crestor.  2. Apical variant hypertrophic cardiomyopathy: Noted on prior echo and confirmed on cardiac MRI. Euvolemic and well compensated on exam. She denies any dizziness, presyncope, syncope. Overall stable.  Continue current medications as above.   3. Paroxysmal atrial fibrillation/atrial flutter: EKG today shows what appears to be rate controlled atrial flutter. She notes recent shortness of breath, fatigue.  Suspect atrial fibrillation could be contributing to her symptoms.  Will check 7-day ZIO monitor to assess A-fib burden. Will  refer to A-fib clinic for ongoing management.  Continue diltiazem, carvedilol, Eliquis.  4. Hypertension: BP mildly elevated in office today, likely in the setting of significant pain.  Continue to monitor and report BP consistently greater than 140/80.  Continue current antihypertensive regimen.   5. Hyperlipidemia: LDL was 69 in 07/2023.  Crestor was recently increased to 20 mg daily.  Will check fasting lipids, CMET in 2 months. Continue Crestor.   6. Disposition: Follow-up with the A-fib clinic, follow-up with Dr. Servando Salina in 3 months.       Joylene Grapes, NP 07/28/2023, 3:23 PM

## 2023-07-28 NOTE — Progress Notes (Unsigned)
Enrolled patient for a 7 day Zio XT monitor to be mailed to patients home   Tobb to read

## 2023-07-30 ENCOUNTER — Encounter: Payer: Self-pay | Admitting: Nurse Practitioner

## 2023-07-31 DIAGNOSIS — N1832 Chronic kidney disease, stage 3b: Secondary | ICD-10-CM | POA: Diagnosis not present

## 2023-07-31 DIAGNOSIS — D582 Other hemoglobinopathies: Secondary | ICD-10-CM | POA: Diagnosis not present

## 2023-07-31 DIAGNOSIS — Z0000 Encounter for general adult medical examination without abnormal findings: Secondary | ICD-10-CM | POA: Diagnosis not present

## 2023-07-31 DIAGNOSIS — Z131 Encounter for screening for diabetes mellitus: Secondary | ICD-10-CM | POA: Diagnosis not present

## 2023-07-31 DIAGNOSIS — Z1339 Encounter for screening examination for other mental health and behavioral disorders: Secondary | ICD-10-CM | POA: Diagnosis not present

## 2023-07-31 DIAGNOSIS — Z780 Asymptomatic menopausal state: Secondary | ICD-10-CM | POA: Diagnosis not present

## 2023-07-31 DIAGNOSIS — Z79899 Other long term (current) drug therapy: Secondary | ICD-10-CM | POA: Diagnosis not present

## 2023-07-31 DIAGNOSIS — I4892 Unspecified atrial flutter: Secondary | ICD-10-CM | POA: Diagnosis not present

## 2023-08-01 DIAGNOSIS — I4892 Unspecified atrial flutter: Secondary | ICD-10-CM | POA: Diagnosis not present

## 2023-08-01 DIAGNOSIS — I480 Paroxysmal atrial fibrillation: Secondary | ICD-10-CM

## 2023-08-15 ENCOUNTER — Encounter: Payer: Self-pay | Admitting: Internal Medicine

## 2023-08-16 DIAGNOSIS — N3941 Urge incontinence: Secondary | ICD-10-CM | POA: Diagnosis not present

## 2023-08-17 DIAGNOSIS — I4892 Unspecified atrial flutter: Secondary | ICD-10-CM | POA: Diagnosis not present

## 2023-08-17 DIAGNOSIS — I480 Paroxysmal atrial fibrillation: Secondary | ICD-10-CM | POA: Diagnosis not present

## 2023-08-18 DIAGNOSIS — H26491 Other secondary cataract, right eye: Secondary | ICD-10-CM | POA: Diagnosis not present

## 2023-08-18 DIAGNOSIS — H26492 Other secondary cataract, left eye: Secondary | ICD-10-CM | POA: Diagnosis not present

## 2023-08-21 ENCOUNTER — Encounter (HOSPITAL_COMMUNITY): Payer: Self-pay | Admitting: Physician Assistant

## 2023-08-21 ENCOUNTER — Ambulatory Visit (HOSPITAL_COMMUNITY)
Admission: RE | Admit: 2023-08-21 | Discharge: 2023-08-21 | Disposition: A | Discharge status: Home / Self Care | Payer: Medicare Other | Source: Ambulatory Visit | Attending: Physician Assistant | Admitting: Physician Assistant

## 2023-08-21 VITALS — BP 118/68 | HR 70 | Ht 66.0 in | Wt 208.4 lb

## 2023-08-21 DIAGNOSIS — I10 Essential (primary) hypertension: Secondary | ICD-10-CM | POA: Diagnosis not present

## 2023-08-21 DIAGNOSIS — I4892 Unspecified atrial flutter: Secondary | ICD-10-CM | POA: Insufficient documentation

## 2023-08-21 DIAGNOSIS — H8109 Meniere's disease, unspecified ear: Secondary | ICD-10-CM | POA: Diagnosis not present

## 2023-08-21 DIAGNOSIS — I2510 Atherosclerotic heart disease of native coronary artery without angina pectoris: Secondary | ICD-10-CM | POA: Diagnosis not present

## 2023-08-21 DIAGNOSIS — Z7901 Long term (current) use of anticoagulants: Secondary | ICD-10-CM | POA: Insufficient documentation

## 2023-08-21 DIAGNOSIS — Z79899 Other long term (current) drug therapy: Secondary | ICD-10-CM | POA: Insufficient documentation

## 2023-08-21 DIAGNOSIS — E785 Hyperlipidemia, unspecified: Secondary | ICD-10-CM | POA: Diagnosis not present

## 2023-08-21 DIAGNOSIS — I480 Paroxysmal atrial fibrillation: Secondary | ICD-10-CM | POA: Diagnosis not present

## 2023-08-21 DIAGNOSIS — D6869 Other thrombophilia: Secondary | ICD-10-CM | POA: Diagnosis not present

## 2023-08-21 LAB — TSH: TSH: 2.001 u[IU]/mL (ref 0.350–4.500)

## 2023-08-21 MED ORDER — AMIODARONE HCL 200 MG PO TABS
ORAL_TABLET | ORAL | 1 refills | Status: DC
Start: 2023-08-21 — End: 2023-12-18

## 2023-08-21 NOTE — Progress Notes (Signed)
 Primary Care Physician: Buckner Malta, MD Primary Cardiologist: Thomasene Ripple, DO Electrophysiologist: None  Referring Physician: Bernadene Person NP   Monica Anderson is a 78 y.o. female with a history of CAD, HCM, HTN, HLD, Meniere's disease, atrial fibrillation who presents for follow up in the [facility].  The patient was initially diagnosed with atrial fibrillation 2015 and is on Eliquis for stroke prevention.   Patient presents today for follow up for atrial fibrillation and atrial flutter. She was seen by Bernadene Person 07/28/23 and found to be in coarse afib vs atypical atrial flutter. She wore a Zio monitor which showed no afib however, patient presented today back in afib. She does have symptoms of fatigue and SOB. No bleeding issues on anticoagulation.   Today, she denies symptoms of palpitations, chest pain, orthopnea, PND, lower extremity edema, dizziness, presyncope, syncope, snoring, daytime somnolence, bleeding, or neurologic sequela. The patient is tolerating medications without difficulties and is otherwise without complaint today.    Atrial Fibrillation Risk Factors:  she does not have symptoms or diagnosis of sleep apnea. she does not have a history of rheumatic fever.   Atrial Fibrillation Management history:  Previous antiarrhythmic drugs: none Previous cardioversions: none Previous ablations: none Anticoagulation history: Eliquis  ROS- All systems are reviewed and negative except as per the HPI above.  Past Medical History:  Diagnosis Date   Anemia    Arthritis    Asymmetrical left sensorineural hearing loss 10/01/2019   Atrial fibrillation (HCC) 10/02/2019   Essential hypertension 10/03/2019   History of colon polyps    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, left (HCC) 01/17/2017   Meniere's disease 08/29/2019   with hearing loss in Lt ear   Mixed hyperlipidemia 10/03/2019   Multiple dysplastic nevi     Other hypertrophic cardiomyopathy (HCC)    Pleural effusion     Current Outpatient Medications  Medication Sig Dispense Refill   buPROPion (WELLBUTRIN XL) 150 MG 24 hr tablet Take 150 mg by mouth in the morning.     Calcium Carb-Cholecalciferol (CALCIUM 600+D3 PO) Take 1 tablet by mouth in the morning.     carvedilol (COREG) 6.25 MG tablet Take 1 tablet (6.25 mg total) by mouth 2 (two) times daily. 180 tablet 3   Cholecalciferol (VITAMIN D3) 50 MCG (2000 UT) CAPS Take 1 capsule by mouth daily.     diltiazem (CARDIZEM CD) 240 MG 24 hr capsule TAKE 1 CAPSULE BY MOUTH EVERY DAY 90 capsule 1   ELIQUIS 5 MG TABS tablet Take 5 mg by mouth 2 (two) times daily.  3   estradiol (ESTRACE) 0.1 MG/GM vaginal cream Place 1 Applicatorful vaginally every Monday, Wednesday, and Friday at 8 PM.     famotidine (PEPCID) 20 MG tablet Take 20 mg by mouth at bedtime.     FARXIGA 5 MG TABS tablet Take 5 mg by mouth in the morning.     ketoconazole (NIZORAL) 2 % cream Apply 1 Application topically 2 (two) times daily as needed (irritation.).     montelukast (SINGULAIR) 10 MG tablet Take 10 mg by mouth at bedtime.     Multiple Vitamins-Minerals (PRESERVISION AREDS 2 PO) Take 1 tablet by mouth in the morning.     nitrofurantoin, macrocrystal-monohydrate, (MACROBID) 100 MG capsule Take 100 mg by mouth daily.     Omega-3 Fatty Acids (OMEGA-3 FISH OIL) 1200 MG CAPS Take 1,200 mg by mouth every evening.     rosuvastatin (CRESTOR) 20  MG tablet Take 1 tablet (20 mg total) by mouth every evening. 90 tablet 1   spironolactone-hydrochlorothiazide (ALDACTAZIDE) 25-25 MG tablet Take 0.5 tablets by mouth in the morning.     No current facility-administered medications for this encounter.    Physical Exam: BP 118/68   Pulse 70   Ht 5\' 6"  (1.676 m)   Wt 94.5 kg   LMP  (LMP Unknown)   BMI 33.64 kg/m   GEN: Well nourished, well developed in no acute distress NECK: No JVD; No carotid bruits CARDIAC: Irregularly irregular  rate and rhythm, no murmurs, rubs, gallops RESPIRATORY:  Clear to auscultation without rales, wheezing or rhonchi  ABDOMEN: Soft, non-tender, non-distended EXTREMITIES:  No edema; No deformity   Wt Readings from Last 3 Encounters:  08/21/23 94.5 kg  07/28/23 94.3 kg  07/11/23 92.7 kg     EKG today demonstrates  Coarse afib vs atrial flutter with variable block Vent. rate 70 BPM PR interval * ms QRS duration 84 ms QT/QTcB 426/460 ms  Echo 07/08/23     CHA2DS2-VASc Score = 4  The patient's score is based upon: CHF History: 0 HTN History: 1 Diabetes History: 0 Stroke History: 0 Vascular Disease History: 0 Age Score: 2 Gender Score: 1       ASSESSMENT AND PLAN: Paroxysmal Atrial Fibrillation/atrial flutter The patient's CHA2DS2-VASc score is 4, indicating a 4.8% annual risk of stroke.   Patient in rate controlled coarse afib vs atrial flutter today. We discussed rhythm control options including AAD (dofetilide, amiodarone) and ablation. Would avoid class IC and Multaq with h/o HCM. Short term, will start amiodarone 200 mg BID x 4 weeks then decrease to once daily. Check TSH today. Long term, she is agreeable to referral to EP for ablation.  Continue carvedilol 6.25 mg BID Continue diltiazem 240 mg daily Continue Eliquis 5 mg BID  Secondary Hypercoagulable State (ICD10:  D68.69) The patient is at significant risk for stroke/thromboembolism based upon her CHA2DS2-VASc Score of 4.  Continue Apixaban (Eliquis). No bleeding issues.   CAD No anginal symptoms Followed by Dr Servando Salina  HTN Stable on current regimen  HCM Fluid status appears stable today    Follow up in the AF clinic in 2 weeks for ECG then with Dr Elberta Fortis to discuss ablation.        Jorja Loa PA-C [facility] [address] [place], [place] [address] [phone]

## 2023-08-21 NOTE — Patient Instructions (Signed)
 Start Amiodarone 200mg  twice a day (with food) for the next 30 days then reduce to once a day

## 2023-09-01 DIAGNOSIS — H26491 Other secondary cataract, right eye: Secondary | ICD-10-CM | POA: Diagnosis not present

## 2023-09-04 ENCOUNTER — Ambulatory Visit (HOSPITAL_COMMUNITY)
Admission: RE | Admit: 2023-09-04 | Discharge: 2023-09-04 | Disposition: A | Discharge status: Home / Self Care | Source: Ambulatory Visit | Attending: Physician Assistant | Admitting: Physician Assistant

## 2023-09-04 DIAGNOSIS — D6869 Other thrombophilia: Secondary | ICD-10-CM | POA: Diagnosis not present

## 2023-09-04 DIAGNOSIS — Z79899 Other long term (current) drug therapy: Secondary | ICD-10-CM | POA: Diagnosis not present

## 2023-09-04 DIAGNOSIS — I480 Paroxysmal atrial fibrillation: Secondary | ICD-10-CM | POA: Diagnosis not present

## 2023-09-04 DIAGNOSIS — I483 Typical atrial flutter: Secondary | ICD-10-CM | POA: Insufficient documentation

## 2023-09-04 DIAGNOSIS — Z5181 Encounter for therapeutic drug level monitoring: Secondary | ICD-10-CM | POA: Insufficient documentation

## 2023-09-04 NOTE — Progress Notes (Signed)
 Patient returns for ECG after starting amiodarone. ECG shows:  Typical atrial flutter with 4:1 block Vent. rate 64 BPM PR interval * ms QRS duration 164 ms QT/QTcB 430/443 ms  Patient will continue amiodarone 200 mg BID for two more weeks then decrease to once daily. She has follow up with Dr Elberta Fortis on 4/7 to discuss ablation. If she remains in flutter, she may need DCCV after loading on amiodarone.

## 2023-09-11 ENCOUNTER — Encounter: Payer: Self-pay | Admitting: Cardiology

## 2023-09-11 ENCOUNTER — Ambulatory Visit: Attending: Cardiology | Admitting: Cardiology

## 2023-09-11 VITALS — BP 128/78 | HR 63 | Ht 66.0 in | Wt 212.2 lb

## 2023-09-11 DIAGNOSIS — I2510 Atherosclerotic heart disease of native coronary artery without angina pectoris: Secondary | ICD-10-CM | POA: Insufficient documentation

## 2023-09-11 DIAGNOSIS — I10 Essential (primary) hypertension: Secondary | ICD-10-CM | POA: Diagnosis not present

## 2023-09-11 DIAGNOSIS — I483 Typical atrial flutter: Secondary | ICD-10-CM | POA: Diagnosis not present

## 2023-09-11 DIAGNOSIS — I480 Paroxysmal atrial fibrillation: Secondary | ICD-10-CM | POA: Insufficient documentation

## 2023-09-11 DIAGNOSIS — Z01812 Encounter for preprocedural laboratory examination: Secondary | ICD-10-CM | POA: Diagnosis not present

## 2023-09-11 DIAGNOSIS — D6869 Other thrombophilia: Secondary | ICD-10-CM | POA: Insufficient documentation

## 2023-09-11 NOTE — Progress Notes (Signed)
 Electrophysiology Office Note:   Date:  09/11/2023  ID:  Monica Anderson, DOB [birth year], MRN [id number]  Primary Cardiologist: Thomasene Ripple, DO Primary Heart Failure: None Electrophysiologist: None      History of Present Illness:   Monica Anderson is a 78 y.o. female with h/o coronary disease, hypertrophic cardiomyopathy, hypertension, hyperlipidemia, Mnire's disease, atrial fibrillation seen today for  for Electrophysiology evaluation of atrial fibrillation at the request of Monica Anderson.    She has a history of both atrial fibrillation and atrial flutter.  She wore a ZIO monitor that showed no episodes of atrial fibrillation.  She has symptoms of fatigue and shortness of breath.  Today, denies symptoms of palpitations, chest pain, orthopnea, PND, lower extremity edema, claudication, dizziness, presyncope, syncope, bleeding, or neurologic sequela. The patient is tolerating medications without difficulties.  Today, her only complaint is shortness of breath.  She can do her daily activities, but has noted increasing shortness of breath when she climbs stairs.  She has to do her daily activities more slowly.  Review of systems complete and found to be negative unless listed in HPI.   EP Information / Studies Reviewed:    EKG is not ordered today. EKG from 09/04/2023 reviewed which showed atrial flutter        Risk Assessment/Calculations:    CHA2DS2-VASc Score = 4   This indicates a 4.8% annual risk of stroke. The patient's score is based upon: CHF History: 0 HTN History: 1 Diabetes History: 0 Stroke History: 0 Vascular Disease History: 0 Age Score: 2 Gender Score: 1             Physical Exam:   VS:  BP 128/78   Pulse 63   Ht 5\' 6"  (1.676 m)   Wt 212 lb 3.2 oz (96.3 kg)   LMP  (LMP Unknown)   SpO2 96%   BMI 34.25 kg/m    Wt Readings from Last 3 Encounters:  09/11/23 212 lb 3.2 oz (96.3 kg)  08/21/23 208 lb 6.4 oz (94.5 kg)  07/28/23 208 lb (94.3 kg)     GEN:  Well nourished, well developed in no acute distress NECK: No JVD; No carotid bruits CARDIAC: Regular rate and rhythm, no murmurs, rubs, gallops RESPIRATORY:  Clear to auscultation without rales, wheezing or rhonchi  ABDOMEN: Soft, non-tender, non-distended EXTREMITIES:  No edema; No deformity   ASSESSMENT AND PLAN:    1.  Paroxysmal atrial fibrillation/flutter: Currently on carvedilol and diltiazem.  She has recently been started on amiodarone.  He is to have fatigue and shortness of breath.  She is likely continued to be in atrial flutter, but her heart rate is regular so it is difficult to discern.  She would prefer to have amiodarone be a short-term medication.  Due to that, we Syrita Dovel plan for ablation.  Risks and benefits have been discussed.  She understands the risks and has agreed to the procedure.  Risk, benefits, and alternatives to EP study and radiofrequency/pulse field ablation for afib were also discussed in detail today. These risks include but are not limited to stroke, bleeding, vascular damage, tamponade, perforation, damage to the esophagus, lungs, and other structures, pulmonary vein stenosis, worsening renal function, and death. The patient understands these risk and wishes to proceed.  We Tris Howell therefore proceed with catheter ablation at the next available time.  Carto, ICE, anesthesia are requested for the procedure.  Alveena Taira also obtain CT PV protocol prior to the procedure to exclude LAA thrombus and further evaluate atrial  anatomy.  2.  Secondary hypercoagulable state: Currently on Eliquis for atrial fibrillation  3.  Coronary artery disease: No current angina  4.  Hypertension: Currently well-controlled  5.  Hypertrophic cardiomyopathy: No obvious volume overload  Follow up with Afib Clinic as usual post procedure  Signed, Shama Monfils Monica Loa, MD

## 2023-09-11 NOTE — Addendum Note (Signed)
 Addended by: Baird Lyons on: 09/11/2023 09:26 AM   Modules accepted: Orders

## 2023-09-11 NOTE — Patient Instructions (Signed)
 Medication Instructions:  Your physician recommends that you continue on your current medications as directed. Please refer to the Current Medication list given to you today.  *If you need a refill on your cardiac medications before your next appointment, please call your pharmacy*   Lab Work: Pre procedure labs -- we will call you to schedule:  BMP & CBC  If you have a lab test that is abnormal and we need to change your treatment, we will call you to review the results -- otherwise no news is good news.    Testing/Procedures: Your physician has requested that you have cardiac CT 1 month PRIOR to your ablation. Cardiac computed tomography (CT) is a painless test that uses an x-ray machine to take clear, detailed pictures of your heart. We will contact you if the result is abnormal. We will call you to schedule.  Your physician has recommended that you have an ablation. Catheter ablation is a medical procedure used to treat some cardiac arrhythmias (irregular heartbeats). During catheter ablation, a long, thin, flexible tube is put into a blood vessel in your groin (upper thigh), or neck. This tube is called an ablation catheter. It is then guided to your heart through the blood vessel. Radio frequency waves destroy small areas of heart tissue where abnormal heartbeats may cause an arrhythmia to start.   Your ablation is scheduled for 11/17/23. Please arrive at [facility] at 5:30 am.  We will call you for further instructions.   Follow-Up: At [facility], you and your health needs are our priority.  As part of our continuing mission to provide you with exceptional heart care, we have created designated Provider Care Teams.  These Care Teams include your primary Cardiologist (physician) and Advanced Practice Providers (APPs -  Physician Assistants and Nurse Practitioners) who all work together to provide you with the care you need, when you need it.  Your next appointment:   1  month(s) after your ablation  The format for your next appointment:   In Person  Provider:   AFib clinic   Thank you for choosing [facility]!!   Dory Horn, RN [phone]    Other Instructions   Cardiac Ablation Cardiac ablation is a procedure to destroy (ablate) some heart tissue that is sending bad signals. These bad signals cause problems in heart rhythm. The heart has many areas that make these signals. If there are problems in these areas, they can make the heart beat in a way that is not normal. Destroying some tissues can help make the heart rhythm normal. Tell your doctor about: Any allergies you have. All medicines you are taking. These include vitamins, herbs, eye drops, creams, and over-the-counter medicines. Any problems you or family members have had with medicines that make you fall asleep (anesthetics). Any blood disorders you have. Any surgeries you have had. Any medical conditions you have, such as kidney failure. Whether you are pregnant or may be pregnant. What are the risks? This is a safe procedure. But problems may occur, including: Infection. Bruising and bleeding. Bleeding into the chest. Stroke or blood clots. Damage to nearby areas of your body. Allergies to medicines or dyes. The need for a pacemaker if the normal system is damaged. Failure of the procedure to treat the problem. What happens before the procedure? Medicines Ask your doctor about: Changing or stopping your normal medicines. This is important. Taking aspirin and ibuprofen. Do not take these medicines unless your doctor tells you to  take them. Taking other medicines, vitamins, herbs, and supplements. General instructions Follow instructions from your doctor about what you cannot eat or drink. Plan to have someone take you home from the hospital or clinic. If you will be going home right after the procedure, plan to have someone with you for 24 hours. Ask your doctor  what steps will be taken to prevent infection. What happens during the procedure?  An IV tube will be put into one of your veins. You will be given a medicine to help you relax. The skin on your neck or groin will be numbed. A cut (incision) will be made in your neck or groin. A needle will be put through your cut and into a large vein. A tube (catheter) will be put into the needle. The tube will be moved to your heart. Dye may be put through the tube. This helps your doctor see your heart. Small devices (electrodes) on the tube will send out signals. A type of energy will be used to destroy some heart tissue. The tube will be taken out. Pressure will be held on your cut. This helps stop bleeding. A bandage will be put over your cut. The exact procedure may vary among doctors and hospitals. What happens after the procedure? You will be watched until you leave the hospital or clinic. This includes checking your heart rate, breathing rate, oxygen, and blood pressure. Your cut will be watched for bleeding. You will need to lie still for a few hours. Do not drive for 24 hours or as long as your doctor tells you. Summary Cardiac ablation is a procedure to destroy some heart tissue. This is done to treat heart rhythm problems. Tell your doctor about any medical conditions you may have. Tell him or her about all medicines you are taking to treat them. This is a safe procedure. But problems may occur. These include infection, bruising, bleeding, and damage to nearby areas of your body. Follow what your doctor tells you about food and drink. You may also be told to change or stop some of your medicines. After the procedure, do not drive for 24 hours or as long as your doctor tells you. This information is not intended to replace advice given to you by your health care provider. Make sure you discuss any questions you have with your health care provider. Document Revised: 08/13/2021 Document  Reviewed: 04/25/2019 Elsevier Patient Education  2023 Elsevier Inc.   Cardiac Ablation, Care After  This sheet gives you information about how to care for yourself after your procedure. Your health care provider may also give you more specific instructions. If you have problems or questions, contact your health care provider. What can I expect after the procedure? After the procedure, it is common to have: Bruising around your puncture site. Tenderness around your puncture site. Skipped heartbeats. If you had an atrial fibrillation ablation, you may have atrial fibrillation during the first several months after your procedure.  Tiredness (fatigue).  Follow these instructions at home: Puncture site care  Follow instructions from your health care provider about how to take care of your puncture site. Make sure you: If present, leave stitches (sutures), skin glue, or adhesive strips in place. These skin closures may need to stay in place for up to 2 weeks. If adhesive strip edges start to loosen and curl up, you may trim the loose edges. Do not remove adhesive strips completely unless your health care provider tells you to do that.  If a large square bandage is present, this may be removed 24 hours after surgery.  Check your puncture site every day for signs of infection. Check for: Redness, swelling, or pain. Fluid or blood. If your puncture site starts to bleed, lie down on your back, apply firm pressure to the area, and contact your health care provider. Warmth. Pus or a bad smell. A pea or small marble sized lump at the site is normal and can take up to three months to resolve.  Driving Do not drive for at least 4 days after your procedure or however long your health care provider recommends. (Do not resume driving if you have previously been instructed not to drive for other health reasons.) Do not drive or use heavy machinery while taking prescription pain medicine. Activity Avoid  activities that take a lot of effort for at least 7 days after your procedure. Do not lift anything that is heavier than 5 lb (4.5 kg) for one week.  No sexual activity for 1 week.  Return to your normal activities as told by your health care provider. Ask your health care provider what activities are safe for you. General instructions Take over-the-counter and prescription medicines only as told by your health care provider. Do not use any products that contain nicotine or tobacco, such as cigarettes and e-cigarettes. If you need help quitting, ask your health care provider. You may shower after 24 hours, but Do not take baths, swim, or use a hot tub for 1 week.  Do not drink alcohol for 24 hours after your procedure. Keep all follow-up visits as told by your health care provider. This is important. Contact a health care provider if: You have redness, mild swelling, or pain around your puncture site. You have fluid or blood coming from your puncture site that stops after applying firm pressure to the area. Your puncture site feels warm to the touch. You have pus or a bad smell coming from your puncture site. You have a fever. You have chest pain or discomfort that spreads to your neck, jaw, or arm. You have chest pain that is worse with lying on your back or taking a deep breath. You are sweating a lot. You feel nauseous. You have a fast or irregular heartbeat. You have shortness of breath. You are dizzy or light-headed and feel the need to lie down. You have pain or numbness in the arm or leg closest to your puncture site. Get help right away if: Your puncture site suddenly swells. Your puncture site is bleeding and the bleeding does not stop after applying firm pressure to the area. These symptoms may represent a serious problem that is an emergency. Do not wait to see if the symptoms will go away. Get medical help right away. Call your local emergency services (911 in the [place] Do not  drive yourself to the hospital. Summary After the procedure, it is normal to have bruising and tenderness at the puncture site in your groin, neck, or forearm. Check your puncture site every day for signs of infection. Get help right away if your puncture site is bleeding and the bleeding does not stop after applying firm pressure to the area. This is a medical emergency. This information is not intended to replace advice given to you by your health care provider. Make sure you discuss any questions you have with your health care provider.

## 2023-09-12 DIAGNOSIS — Z1231 Encounter for screening mammogram for malignant neoplasm of breast: Secondary | ICD-10-CM | POA: Diagnosis not present

## 2023-09-18 DIAGNOSIS — M1712 Unilateral primary osteoarthritis, left knee: Secondary | ICD-10-CM | POA: Diagnosis not present

## 2023-09-20 DIAGNOSIS — N3941 Urge incontinence: Secondary | ICD-10-CM | POA: Diagnosis not present

## 2023-09-21 ENCOUNTER — Telehealth: Payer: Self-pay | Admitting: Cardiology

## 2023-09-21 NOTE — Telephone Encounter (Signed)
 Spoke with the patient and made her aware that she will need to be on Eliquis uninterrupted for 3 weeks prior to her ablation. She states that she will keep ablation as scheduled for now.

## 2023-09-21 NOTE — Telephone Encounter (Signed)
 Spoke with patient and she states she is scheduled for ablation in June. She states she also need a knee replacement. She states being that she will need to stay on eliquis for 3 months. She would like to know if she held off the ablation and knee replacement first how long after knee replacement can she do ablation. She also would like to know being that she has AFIB and Aflutter would she be cleared for her knee replacement

## 2023-09-21 NOTE — Telephone Encounter (Signed)
 Patient called and wanted to ask Dr. [name] some questions

## 2023-09-26 DIAGNOSIS — I480 Paroxysmal atrial fibrillation: Secondary | ICD-10-CM | POA: Diagnosis not present

## 2023-09-26 DIAGNOSIS — Z01812 Encounter for preprocedural laboratory examination: Secondary | ICD-10-CM | POA: Diagnosis not present

## 2023-09-26 DIAGNOSIS — I483 Typical atrial flutter: Secondary | ICD-10-CM | POA: Diagnosis not present

## 2023-09-27 LAB — CBC
Hematocrit: 48.1 % — ABNORMAL HIGH (ref 34.0–46.6)
Hemoglobin: 15.2 g/dL (ref 11.1–15.9)
MCH: 30 pg (ref 26.6–33.0)
MCHC: 31.6 g/dL (ref 31.5–35.7)
MCV: 95 fL (ref 79–97)
Platelets: 222 10*3/uL (ref 150–450)
RBC: 5.07 x10E6/uL (ref 3.77–5.28)
RDW: 13.2 % (ref 11.7–15.4)
WBC: 6.2 10*3/uL (ref 3.4–10.8)

## 2023-09-27 LAB — BASIC METABOLIC PANEL WITH GFR
BUN/Creatinine Ratio: 21 (ref 12–28)
BUN: 24 mg/dL (ref 8–27)
CO2: 22 mmol/L (ref 20–29)
Calcium: 9.3 mg/dL (ref 8.7–10.3)
Chloride: 105 mmol/L (ref 96–106)
Creatinine, Ser: 1.17 mg/dL — ABNORMAL HIGH (ref 0.57–1.00)
Glucose: 104 mg/dL — ABNORMAL HIGH (ref 70–99)
Potassium: 4.6 mmol/L (ref 3.5–5.2)
Sodium: 142 mmol/L (ref 134–144)
eGFR: 48 mL/min/{1.73_m2} — ABNORMAL LOW (ref >59)

## 2023-10-11 DIAGNOSIS — N3941 Urge incontinence: Secondary | ICD-10-CM | POA: Diagnosis not present

## 2023-10-19 ENCOUNTER — Encounter: Payer: Self-pay | Admitting: Cardiology

## 2023-10-19 DIAGNOSIS — M25562 Pain in left knee: Secondary | ICD-10-CM | POA: Diagnosis not present

## 2023-10-19 DIAGNOSIS — M1712 Unilateral primary osteoarthritis, left knee: Secondary | ICD-10-CM | POA: Diagnosis not present

## 2023-10-23 ENCOUNTER — Other Ambulatory Visit: Payer: Self-pay

## 2023-10-23 DIAGNOSIS — I480 Paroxysmal atrial fibrillation: Secondary | ICD-10-CM

## 2023-10-24 ENCOUNTER — Encounter: Payer: Self-pay | Admitting: Cardiology

## 2023-10-26 ENCOUNTER — Telehealth (HOSPITAL_COMMUNITY): Payer: Self-pay | Admitting: *Deleted

## 2023-10-26 ENCOUNTER — Telehealth: Payer: Self-pay

## 2023-10-26 ENCOUNTER — Telehealth: Payer: Self-pay | Admitting: Pharmacist

## 2023-10-26 DIAGNOSIS — Z9189 Other specified personal risk factors, not elsewhere classified: Secondary | ICD-10-CM | POA: Diagnosis not present

## 2023-10-26 DIAGNOSIS — Z79899 Other long term (current) drug therapy: Secondary | ICD-10-CM | POA: Diagnosis not present

## 2023-10-26 DIAGNOSIS — R32 Unspecified urinary incontinence: Secondary | ICD-10-CM | POA: Diagnosis not present

## 2023-10-26 DIAGNOSIS — I4892 Unspecified atrial flutter: Secondary | ICD-10-CM | POA: Diagnosis not present

## 2023-10-26 NOTE — Progress Notes (Signed)
   10/26/2023  Patient ID: Monica Anderson, female   DOB: [birth year], 78 y.o.   MRN: [id number]  Met with patient today at office following primary care provider visit. Reviewed patient assistance application and process with patient. Collected required documents and sent to [facility] Patient Advocate for further coordination.   Calvert Caul, PharmD Clinical Pharmacist [place] Direct Dial: [phone]

## 2023-10-26 NOTE — Telephone Encounter (Signed)
Reaching out to patient to offer assistance regarding upcoming cardiac imaging study; pt verbalizes understanding of appt date/time, parking situation and where to check in, pre-test NPO status  and verified current allergies; name and call back number provided for further questions should they arise ? ?Adalaya Irion RN Navigator Cardiac Imaging ?[facility] ?[phone] office ?[phone] cell ? ?

## 2023-10-26 NOTE — Telephone Encounter (Signed)
 PAP: Application for Monica Anderson has been submitted to [employer] ([employer]), via fax

## 2023-10-26 NOTE — Telephone Encounter (Signed)
 Attempted to call patient regarding upcoming cardiac CT appointment. Left message on voicemail with name and callback number  Larey Brick RN Navigator Cardiac Imaging [facility] [phone] Office [phone] Cell

## 2023-10-27 ENCOUNTER — Ambulatory Visit (HOSPITAL_COMMUNITY)
Admission: RE | Admit: 2023-10-27 | Discharge: 2023-10-27 | Disposition: A | Discharge status: Home / Self Care | Source: Ambulatory Visit | Attending: Cardiology | Admitting: Cardiology

## 2023-10-27 DIAGNOSIS — I483 Typical atrial flutter: Secondary | ICD-10-CM | POA: Diagnosis not present

## 2023-10-27 DIAGNOSIS — I480 Paroxysmal atrial fibrillation: Secondary | ICD-10-CM | POA: Diagnosis not present

## 2023-10-27 MED ORDER — IOHEXOL 350 MG/ML SOLN
100.0000 mL | Freq: Once | INTRAVENOUS | Status: AC | PRN
  Administered 2023-10-27: 100 mL via INTRAVENOUS

## 2023-10-27 NOTE — Telephone Encounter (Signed)
 PAP: Patient assistance application for Farxiga  has been approved by P[employer] Companies: [employer] from 10/26/2023 to 06/05/2024. Medication should be delivered to PAP Delivery: Home. For further shipping updates, please contact [employer] ([employer]) at [phone]. Patient ID is: [id number]

## 2023-10-27 NOTE — Progress Notes (Signed)
 Pharmacy Medication Assistance Program Note    10/27/2023  Patient ID: Monica Anderson, female   DOB: [birth year], 78 y.o.   MRN: [id number]     06/19/2023 10/26/2023  Outreach Medication One  Initial Outreach Date (Medication One) 06/15/2023 10/26/2023  Manufacturer Medication One [employer]  [employer] [facility] Farxiga  Farxiga   Dose of Farxiga  5mg  10mg   Type of Forensic scientist Assistance  Date Application Sent to Patient 06/15/2023   Application Items Requested Application;Proof of Income;Other   Date Application Sent to Prescriber 06/15/2023   Name of Prescriber Bridgette Campus Burgart Harvest Lineman  Date Application Received From Patient  10/26/2023  Application Items Received From Patient  Application  Date Application Received From Provider  10/26/2023  Date Application Submitted to Manufacturer  10/26/2023  Patient Assistance Determination  Approved  Approval Start Date  10/27/2023  Approval End Date  06/05/2024  Patient Notification Method  MyChart     Signature

## 2023-10-31 ENCOUNTER — Encounter: Payer: Self-pay | Admitting: Cardiology

## 2023-10-31 ENCOUNTER — Ambulatory Visit: Payer: Medicare Other | Attending: Cardiology | Admitting: Cardiology

## 2023-10-31 VITALS — BP 138/76 | HR 60 | Ht 66.0 in | Wt 208.4 lb

## 2023-10-31 DIAGNOSIS — N1832 Chronic kidney disease, stage 3b: Secondary | ICD-10-CM | POA: Diagnosis not present

## 2023-10-31 DIAGNOSIS — E785 Hyperlipidemia, unspecified: Secondary | ICD-10-CM | POA: Diagnosis not present

## 2023-10-31 DIAGNOSIS — I480 Paroxysmal atrial fibrillation: Secondary | ICD-10-CM

## 2023-10-31 DIAGNOSIS — I10 Essential (primary) hypertension: Secondary | ICD-10-CM

## 2023-10-31 DIAGNOSIS — D6869 Other thrombophilia: Secondary | ICD-10-CM

## 2023-10-31 DIAGNOSIS — I422 Other hypertrophic cardiomyopathy: Secondary | ICD-10-CM | POA: Diagnosis not present

## 2023-10-31 NOTE — Patient Instructions (Signed)

## 2023-10-31 NOTE — Progress Notes (Signed)
 Pounding sensation.  It lasted in the heart with was elevated she therefore decided to go to the emergency department at [facility]. Cardiology Office Note:    Date:  11/06/2023   ID:  Monica Anderson, DOB 1945-08-23, MRN [id number]  PCP:  Harvest Lineman, MD  Cardiologist:  Jerryl Morin, DO  Electrophysiologist:  Will Cortland Ding, MD   Referring MD: Harvest Lineman, MD   No chief complaint on file.  From my heart I am doing very well but recently have had some ataxia  History of Present Illness:    Monica Anderson is a 78 y.o. female with a hx of  Apical variant of hypertrophic cardiomyopath ( on echo and confirmed on Cardiac MR), Paroxysmal atrial fibrillation diagnosed in 2015 on Cardizem  and Eliquis , hypertension, hyperlipidemia, Mnire's disease, recently diagnosed BPPV.    Her last visit with me was 03/2022 at that time she was doing weel from a CV standpoint.   Since her visit with me she was seen by Berkley Breech, NP at that time she was cleared for her procedure- she underwent a right total knee arthroplasty.  Discussed the use of AI scribe software for clinical note transcription with the patient, who gave verbal consent to proceed  She offers no complaints today. She has recently experienced episodes of atrial fibrillation and atrial flutter, with sensations of 'going in and out' of atrial fibrillation.    Past Medical History:  Diagnosis Date   Anemia    Arthritis    Asymmetrical left sensorineural hearing loss 10/01/2019   Atrial fibrillation (HCC) 10/02/2019   Essential hypertension 10/03/2019   History of colon polyps    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, left (HCC) 01/17/2017   Meniere's disease 08/29/2019   with hearing loss in Lt ear   Mixed hyperlipidemia 10/03/2019   Multiple dysplastic nevi    Other hypertrophic cardiomyopathy (HCC)    Pleural effusion     Past Surgical History:  Procedure Laterality Date    APPENDECTOMY  1958   BREAST BIOPSY  1973   CATARACT EXTRACTION, BILATERAL  2019   COLONOSCOPY  11/07/2016   Colonic polyp status post polypectomy. Minimal sigmoid diverticulosis   CORONARY PRESSURE/FFR STUDY N/A 07/10/2023   Procedure: CORONARY PRESSURE/FFR STUDY;  Surgeon: Sammy Crisp, MD;  Location: [facility] IN[facility]IVE CV [facility];  Service: Cardiovascular;  Laterality: N/A;   DILATION AND CURETTAGE OF UTERUS  1968   LEFT HEART CATH AND CORONARY ANGIOGRAPHY N/A 07/10/2023   Procedure: LEFT HEART CATH AND CORONARY ANGIOGRAPHY;  Surgeon: Sammy Crisp, MD;  Location: [facility] IN[facility]IVE CV LAB;  Service: Cardiovascular;  Laterality: N/A;   MOHS SURGERY Left 2019   Melanoma on left ear   TOTAL KNEE ARTHROPLASTY Right 07/18/2022   Procedure: TOTAL KNEE ARTHROPLASTY;  Surgeon: Liliane Rei, MD;  Location: [facility];  Service: Orthopedics;  Laterality: Right;   TUBAL LIGATION  1977    Current Medications: Current Meds  Medication Sig   amiodarone  (PACERONE ) 200 MG tablet Take 1 tablet (200 mg total) by mouth 2 (two) times daily for 30 days, THEN 1 tablet (200 mg total) daily. (Patient taking differently: Take 1 tablet (200 mg total) by mouth 2 (two) times daily for 30 days, THEN 1 tablet (200 mg total) daily. Pt currently taking 200mg  twice daily)   buPROPion  (WELLBUTRIN  XL) 150 MG 24 hr tablet Take 150 mg by mouth in the morning.   Calcium  Carb-Cholecalciferol (CALCIUM  600+D3 PO) Take 1 tablet by mouth in the  morning.   carvedilol  (COREG ) 6.25 MG tablet Take 1 tablet (6.25 mg total) by mouth 2 (two) times daily.   Cholecalciferol (VITAMIN D3) 50 MCG (2000 UT) CAPS Take 1 capsule by mouth daily.   diltiazem  (CARDIZEM  CD) 240 MG 24 hr capsule TAKE 1 CAPSULE BY MOUTH EVERY DAY   ELIQUIS  5 MG TABS tablet Take 5 mg by mouth 2 (two) times daily.   estradiol (ESTRACE) 0.1 MG/GM vaginal cream Place 1 Applicatorful vaginally every Monday, Wednesday, and Friday at 8 PM.   FARXIGA  5 MG TABS tablet Take 5 mg by mouth in  the morning.   ketoconazole (NIZORAL) 2 % cream Apply 1 Application topically 2 (two) times daily as needed (irritation.).   montelukast  (SINGULAIR ) 10 MG tablet Take 10 mg by mouth at bedtime.   Multiple Vitamins-Minerals (PRESERVISION AREDS 2 PO) Take 1 tablet by mouth in the morning.   nitrofurantoin, macrocrystal-monohydrate, (MACROBID) 100 MG capsule Take 100 mg by mouth daily.   Omega-3 Fatty Acids (OMEGA-3 FISH OIL) 1200 MG CAPS Take 1,200 mg by mouth every evening.   rosuvastatin  (CRESTOR ) 20 MG tablet Take 1 tablet (20 mg total) by mouth every evening.   spironolactone -hydrochlorothiazide  (ALDACTAZIDE) 25-25 MG tablet Take 0.5 tablets by mouth in the morning.     Allergies:   Patient has no known allergies.   Social History   Socioeconomic History   Marital status: Married    Spouse name: Not on file   Number of children: 3   Years of education: Not on file   Highest education level: Not on file  Occupational History   Occupation: retired  Tobacco Use   Smoking status: Never   Smokeless tobacco: Never   Tobacco comments:    Never smoked 08/21/23  Vaping Use   Vaping status: Never Used  Substance and Sexual Activity   Alcohol use: Yes    Comment: Wine maybe 2-3 times a year   Drug use: Never   Sexual activity: Not Currently    Partners: Male    Comment: Married  Other Topics Concern   Not on file  Social History Narrative   Right handed    caffeine: 1 cup of decaf in the AM   Lives with husband and one son   Social Drivers of Corporate investment banker Strain: Low Risk  (03/20/2022)   Received from [facility], [facility]   Overall Financial Resource Strain (CARDIA)    Difficulty of Paying Living Expenses: Not hard at all  Food Insecurity: No Food Insecurity (07/11/2023)   Hunger Vital Sign    Worried About Running Out of Food in the Last Year: Never true    Ran Out of Food in the Last Year: Never true  Transportation Needs: No Transportation Needs  (07/11/2023)   PRAPARE - Administrator, Civil Service (Medical): No    Lack of Transportation (Non-Medical): No  Physical Activity: Not on file  Stress: No Stress Concern Present (03/20/2022)   Received from [facility], [facility] of Occupational Health - Occupational Stress Questionnaire    Feeling of Stress : Not at all  Social Connections: Moderately Integrated (07/11/2023)   Social Connection and Isolation Panel [NHANES]    Frequency of Communication with Friends and Family: More than three times a week    Frequency of Social Gatherings with Friends and Family: More than three times a week    Attends Religious Services: More than 4 times per year  Active Member of Clubs or Organizations: No    Attends Banker Meetings: Never    Marital Status: Married     Family History: The patient's family history includes Atrial fibrillation in her mother; Diabetes in her father; Hypertension in her father and mother; Stroke in her father. There is no history of Colon cancer, Liver cancer, Stomach cancer, Rectal cancer, or Esophageal cancer.  ROS:   Review of Systems  Constitution: Negative for decreased appetite, fever and weight gain.  HENT: Negative for congestion, ear discharge, hoarse voice and sore throat.   Eyes: Negative for discharge, redness, vision loss in right eye and visual halos.  Cardiovascular: Negative for chest pain, dyspnea on exertion, leg swelling, orthopnea and palpitations.  Respiratory: Negative for cough, hemoptysis, shortness of breath and snoring.   Endocrine: Negative for heat intolerance and polyphagia.  Hematologic/Lymphatic: Negative for bleeding problem. Does not bruise/bleed easily.  Skin: Negative for flushing, nail changes, rash and suspicious lesions.  Musculoskeletal: Negative for arthritis, joint pain, muscle cramps, myalgias, neck pain and stiffness.  Gastrointestinal: Negative for abdominal pain, bowel  incontinence, diarrhea and excessive appetite.  Genitourinary: Negative for decreased libido, genital sores and incomplete emptying.  Neurological: Negative for brief paralysis, focal weakness, headaches and loss of balance.  Psychiatric/Behavioral: Negative for altered mental status, depression and suicidal ideas.  Allergic/Immunologic: Negative for HIV exposure and persistent infections.    EKGs/Labs/Other Studies Reviewed:    The following studies were reviewed today:   EKG: None today  Transthoracic echocardiogram This result has an attachment that is not available.  There is severe concentric left ventricular hypertrophy with septal and  posterior wall thickness ~ 1.5 cm.  Apical hypertrophy appears more predominant on contrast study.  No evidence of LVOT obstruction.  LVEF 65-70%  Mild tricuspid regurgitation   Left Ventricle  Left ventricle size is normal. There is severe concentric left ventricular hypertrophy with septal and posterior wall thickness ~ 1.5 cm. Apical hypertrophy appears more predominant on contrast study. Systolic function is normal. EF: 65-70%. Wall motion is normal. Doppler parameters consistent with mild diastolic dysfunction and low to normal LA pressure.   Right Ventricle  Right ventricle size is normal. Systolic function is normal. Normal tricuspid annular plane systolic excursion (TAPSE) >1.7 cm. Normal systolic excursion velocity by TDI (>9.5 cm/s).   Left Atrium  Left atrium size is normalLeft atrium volume index is normal (16-34 mL/m2).   Right Atrium  Right atrium size is normal.   IVC/SVC  The inferior vena cava demonstrates a diameter of <=2.1 cm and collapses >50%; therefore, the right atrial pressure is estimated at 3 mmHg.   Mitral Valve  Mitral valve structure is normal. There is mild posterior annular calcification. There is trace regurgitation.   Tricuspid Valve  Tricuspid valve structure is normal. There is mild regurgitation. The  right ventricular systolic pressure is normal (<36 mmHg).   Aortic Valve  The aortic valve is tricuspid. The leaflets are mildly thickened and exhibit normal excursion. The leaflets are mildly calcified. There is no regurgitation or stenosis.   Pulmonic Valve  The pulmonic valve was not well visualized. Trace regurgitation.   Ascending Aorta  The aortic root is normal in size. The ascending aorta is normal in size.   Pericardium  There is no pericardial effusion.   Study Details  A complete echo was performed using complete 2D, color flow Doppler and spectral Doppler. During the study the apical, parasternal, subcostal and suprasternal views were captured. Patient  has normal sinus rhythm. Overall the study quality was fair. The study was technically difficult. The study was difficult due to patient's body habitus. The imaging is on file and stored in a permanent location.   Other studies Reviewed: Additional studies/ records that were reviewed today include: CMRI  Review of the above records today demonstrates:  1. LV apical hypertrophy measuring up to 15mm (basal posterior wall measures 7mm), consistent with apical hypertrophic cardiomyopathy 2.  No late gadolinium enhancement to suggest myocardial scar 3.  Normal LV size with hyperdynamic systolic function (EF 74%) 4.  Normal RV size and systolic function (EF 65%) 5. Small pericardial effusion measures up to 9mm adjacent to LV inferior wall   Coronary CT 03/10/20 1. Coronary calcium  score of 3. This was 31st percentile for age and sex matched control.   2. Normal coronary origin with right dominance.   3. Nonobstructive CAD with calcified plaque in the mid LAD and mid D1 causing minimal (0-24%) stenosis   Cardiac monitor 02/11/2020 personally reviewed 4 Ventricular Tachycardia runs occurred, the run with the fastest interval lasting 14.7 secs with a maximum rate of 152 bpm (average 113 bpm); the run with the fastest interval was  also the longest.   36 Supraventricular Tachycardia runs occurred, the run with the fastest interval lasting 4 beats with a maximum rate of 158 bpm, the longest lasting 14.3 secs with an average rate of 109 bpm.  Recent Labs: 08/21/2023: TSH 2.001 09/26/2023: BUN 24; Creatinine, Ser 1.17; Hemoglobin 15.2; Platelets 222; Potassium 4.6; Sodium 142  Recent Lipid Panel    Component Value Date/Time   CHOL 119 07/11/2023 0851   TRIG 59 07/11/2023 0851   HDL 38 (L) 07/11/2023 0851   CHOLHDL 3.1 07/11/2023 0851   VLDL 12 07/11/2023 0851   LDLCALC 69 07/11/2023 0851    Physical Exam:    VS:  BP 138/76 (BP Location: Right Arm, Patient Position: Sitting, Cuff Size: Normal)   Pulse 60   Ht 5\' 6"  (1.676 m)   Wt 208 lb 6.4 oz (94.5 kg)   LMP  (LMP Unknown)   SpO2 95%   BMI 33.64 kg/m     Wt Readings from Last 3 Encounters:  10/31/23 208 lb 6.4 oz (94.5 kg)  09/11/23 212 lb 3.2 oz (96.3 kg)  08/21/23 208 lb 6.4 oz (94.5 kg)     GEN: Well nourished, well developed in no acute distress HEENT: Normal NECK: No JVD; No carotid bruits LYMPHATICS: No lymphadenopathy CARDIAC: S1S2 noted,RRR, no murmurs, rubs, gallops RESPIRATORY:  Clear to auscultation without rales, wheezing or rhonchi  ABDOMEN: Soft, non-tender, non-distended, +bowel sounds, no guarding. EXTREMITIES: No edema, No cyanosis, no clubbing MUSCULOSKELETAL:  No deformity  SKIN: Warm and dry NEUROLOGIC:  Alert and oriented x 3, non-focal PSYCHIATRIC:  Normal affect, good insight  ASSESSMENT:    1. Apical variant hypertrophic cardiomyopathy (HCC)   2. Hypercoagulable state due to paroxysmal atrial fibrillation (HCC)   3. Hyperlipidemia LDL goal <70   4. Primary hypertension   5. PAF (paroxysmal atrial fibrillation) (HCC)      PLAN:     Intermittent atrial fibrillation and flutter causing dyspnea. For now continue Eliquis  c- Proceed with scheduled ablation on the 13th. - Continue amiodarone  200 mg once daily, coreg   6.25 mg BID and cardizem  240 mg daily.    Hypertrophic cardiomyopathy worsens dyspnea, especially out of sinus rhythm. Current Cardizem  and Coreg  regimen effective. Hoping she can experience symptoms improvement if we can restore  sinus rhythm.  - Continue Cardizem  and Coreg  (carvedilol ) as current regimen.  Hyperlipidemia - continue with current statin medication.  The patient is in agreement with the above plan. The patient left the office in stable condition.  The patient will follow up in 9 months.   Medication Adjustments/Labs and Tests Ordered: Current medicines are reviewed at length with the patient today.  Concerns regarding medicines are outlined above.  No orders of the defined types were placed in this encounter.   No orders of the defined types were placed in this encounter.    Patient Instructions  Medication Instructions:  Your physician recommends that you continue on your current medications as directed. Please refer to the Current Medication list given to you today.  *If you need a refill on your cardiac medications before your next appointment, please call your pharmacy*   Follow-Up: At [facility], you and your health needs are our priority.  As part of our continuing mission to provide you with exceptional heart care, our providers are all part of one team.  This team includes your primary Cardiologist (physician) and Advanced Practice Providers or APPs (Physician Assistants and Nurse Practitioners) who all work together to provide you with the care you need, when you need it.  Your next appointment:   9 month(s)  Provider:   Callaway Hailes, DO       Adopting a Healthy Lifestyle.  Know what a healthy weight is for you (roughly BMI <25) and aim to maintain this   Aim for 7+ servings of fruits and vegetables daily   65-80+ fluid ounces of water or unsweet tea for healthy kidneys   Limit to max 1 drink of alcohol per day; avoid smoking/tobacco    Limit animal fats in diet for cholesterol and heart health - choose grass fed whenever available   Avoid highly processed foods, and foods high in saturated/trans fats   Aim for low stress - take time to unwind and care for your mental health   Aim for 150 min of moderate intensity exercise weekly for heart health, and weights twice weekly for bone health   Aim for 7-9 hours of sleep daily   When it comes to diets, agreement about the perfect plan isnt easy to find, even among the experts. Experts at the [facility] of [employer] developed an idea known as the Healthy Eating Plate. Just imagine a plate divided into logical, healthy portions.   The emphasis is on diet quality:   Load up on vegetables and fruits - one-half of your plate: Aim for color and variety, and remember that potatoes dont count.   Go for whole grains - one-quarter of your plate: Whole wheat, barley, wheat berries, quinoa, oats, brown rice, and foods made with them. If you want pasta, go with whole wheat pasta.   Protein power - one-quarter of your plate: Fish, chicken, beans, and nuts are all healthy, versatile protein sources. Limit red meat.   The diet, however, does go beyond the plate, offering a few other suggestions.   Use healthy plant oils, such as olive, canola, soy, corn, sunflower and peanut. Check the labels, and avoid partially hydrogenated oil, which have unhealthy trans fats.   If youre thirsty, drink water. Coffee and tea are good in moderation, but skip sugary drinks and limit milk and dairy products to one or two daily servings.   The type of carbohydrate in the diet is more important than the amount. Some sources  of carbohydrates, such as vegetables, fruits, whole grains, and beans-are healthier than others.   Finally, stay active  Signed, Zeinab Rodwell, DO  11/06/2023 7:58 PM    [facility]

## 2023-11-01 ENCOUNTER — Ambulatory Visit: Payer: Self-pay | Admitting: Cardiology

## 2023-11-01 DIAGNOSIS — N3941 Urge incontinence: Secondary | ICD-10-CM | POA: Diagnosis not present

## 2023-11-02 ENCOUNTER — Telehealth: Payer: Self-pay

## 2023-11-02 NOTE — Telephone Encounter (Signed)
 Pt called in with a question regarding her Family being with her at the hospital and was given my number by a Triage RN. She wants to know if they will be able to go back to see her after her procedure or if they will need to stay in the waiting area the whole time. I informed the pt that I'm in the office not the hospital so I wasn't sure and couldn't answer that question.   I told her we have a Navigator that is in the hospital and I would fwd her question to her and ask her to call her since she may be able to better assist her.

## 2023-11-02 NOTE — Telephone Encounter (Signed)
 Spoke with patient and answered all questions regarding visitation policy for procedure: typically only one person allowed in the short stay area at a time, but can rotate out with someone if desired. Visitor does not have to stay in waiting room the entire time, but return in time for discharge. Text communication is usually utilized by nursing staff to provide a status update. Patient verbalized understanding and appreciation for call/information.

## 2023-11-07 ENCOUNTER — Telehealth: Payer: Self-pay

## 2023-11-07 ENCOUNTER — Telehealth: Payer: Self-pay | Admitting: *Deleted

## 2023-11-07 NOTE — Telephone Encounter (Signed)
 Discussed with Dr. [name].  Called pt and made aware she will need to be on the blood thinner for 3 months post ablation per MD.  Clarified the information given to her about stopping it for 3 days was meant that it would be ok after the 3 months post.  Patient verbalized understanding and agreeable to plan.

## 2023-11-07 NOTE — Telephone Encounter (Signed)
   Pre-operative Risk Assessment    Patient Name: Monica Anderson  DOB: 04/05/1946 MRN: [id number]   Date of last office visit: 10/31/23 with Dr. Emmette Harms Date of next office visit: None   Request for Surgical Clearance    Procedure:  Left Total Knee Arthroplasty   Date of Surgery:  Clearance 02/19/24                                Surgeon:  Dr. Liliane Rei  Surgeon's Group or Practice Name:  [facility] Phone number:  [phone] attn: Enrigue Harvard Fax number:  [phone]   Type of Clearance Requested:   - Medical    Type of Anesthesia:  Choice   Additional requests/questions:    Kenny Peals   11/07/2023, 3:37 PM

## 2023-11-07 NOTE — Telephone Encounter (Signed)
 Monica Anderson surgery scheduler from [facility] called us  today with a question about holding blood thinner s/p ablation. Pt has cardiac ablation on 11/17/23 with Dr. [name]. Monica Anderson states the pt told her she could hold her blood thinner x 3 days in order to have her surgery in 01/2024 as opposed to the end of Sept 2025. I answered as to the hold time she cannot hold blood thinners x 3 months s/p ablation. I did confirm this with Dr. Adell Age nurse Cindie Credit. RN. Sherri, RN did also come to my phone and s/w Monica Anderson, surgery scheduler to confirm this information as well. Sherri states she will call the pt and explain to the pt that she is not going to be able to hold her blood thinner x 3 s/p ablation.   Monica, thanked us  for our help to confirm about blood thinners to be held s/p cardiac ablation. I will forward this phone note to RN as for further documentation as well.

## 2023-11-08 NOTE — Telephone Encounter (Signed)
 See phone note from yesterday; this has been addressed.

## 2023-11-08 NOTE — Telephone Encounter (Signed)
 Callback team please contact requesting provider office for guidance on holding Eliquis  prior to knee replacement procedure.  Please also make office aware that patient is undergoing AF ablation on 11/2023 and will not be able to hold Eliquis  for 2 months following ablation procedure.  This should be okay due to clearance being 02/19/2024.  Thanks, Renelda Carry

## 2023-11-09 ENCOUNTER — Telehealth (HOSPITAL_COMMUNITY): Payer: Self-pay

## 2023-11-09 NOTE — Telephone Encounter (Signed)
 Attempted to reach patient to discuss upcoming procedure, no answer. Left VM for patient to return call.

## 2023-11-10 DIAGNOSIS — I480 Paroxysmal atrial fibrillation: Secondary | ICD-10-CM | POA: Diagnosis not present

## 2023-11-10 NOTE — Telephone Encounter (Addendum)
 Patient returned call to discuss upcoming procedure.   CT: completed.  Labs: ordered- will have completed by June 9.   Any recent signs of acute illness or been started on antibiotics? No. Reports she has been  Any new medications started? Any medications to hold? Hold Farxiga  for 3 days prior to procedure- last dose on June 9 Any missed doses of blood thinner?  No Advised patient to continue taking ANTICOAGULANT: Eliquis  (Apixaban ) twice daily without missing any doses.  Medication instructions:  On the morning of your procedure DO NOT take any medication., including Eliquis  or the procedure may be rescheduled. Nothing to eat or drink after midnight prior to your procedure.  Confirmed patient is scheduled for Atrial Fibrillation Ablation, Atrial Flutter Ablation on Friday, June 13 with Dr. Agatha Horsfall. Instructed patient to arrive at the Main Entrance A at [facility]: [address] [place], [place] [address] and check in at Admitting at 5:30 AM.  Advised of plan to go home the same day and will only stay overnight if medically necessary. You MUST have a responsible adult to drive you home and MUST be with you the first 24 hours after you arrive home or your procedure could be cancelled.  Patient verbalized understanding to all instructions provided and agreed to proceed with procedure.

## 2023-11-11 LAB — BASIC METABOLIC PANEL WITH GFR
BUN/Creatinine Ratio: 24 (ref 12–28)
BUN: 26 mg/dL (ref 8–27)
CO2: 23 mmol/L (ref 20–29)
Calcium: 9.5 mg/dL (ref 8.7–10.3)
Chloride: 105 mmol/L (ref 96–106)
Creatinine, Ser: 1.07 mg/dL — ABNORMAL HIGH (ref 0.57–1.00)
Glucose: 87 mg/dL (ref 70–99)
Potassium: 4.2 mmol/L (ref 3.5–5.2)
Sodium: 143 mmol/L (ref 134–144)
eGFR: 53 mL/min/{1.73_m2} — ABNORMAL LOW (ref >59)

## 2023-11-11 LAB — CBC
Hematocrit: 47.9 % — ABNORMAL HIGH (ref 34.0–46.6)
Hemoglobin: 15.5 g/dL (ref 11.1–15.9)
MCH: 30.8 pg (ref 26.6–33.0)
MCHC: 32.4 g/dL (ref 31.5–35.7)
MCV: 95 fL (ref 79–97)
Platelets: 199 10*3/uL (ref 150–450)
RBC: 5.03 x10E6/uL (ref 3.77–5.28)
RDW: 14.4 % (ref 11.7–15.4)
WBC: 6.2 10*3/uL (ref 3.4–10.8)

## 2023-11-13 DIAGNOSIS — H524 Presbyopia: Secondary | ICD-10-CM | POA: Diagnosis not present

## 2023-11-13 DIAGNOSIS — H52221 Regular astigmatism, right eye: Secondary | ICD-10-CM | POA: Diagnosis not present

## 2023-11-13 DIAGNOSIS — Z961 Presence of intraocular lens: Secondary | ICD-10-CM | POA: Diagnosis not present

## 2023-11-13 DIAGNOSIS — Z9849 Cataract extraction status, unspecified eye: Secondary | ICD-10-CM | POA: Diagnosis not present

## 2023-11-13 DIAGNOSIS — H5231 Anisometropia: Secondary | ICD-10-CM | POA: Diagnosis not present

## 2023-11-15 NOTE — Telephone Encounter (Signed)
   Name: Monica Anderson  DOB: Nov 01, 1945  MRN: [id number]  Primary Cardiologist: Kardie Tobb, DO  Chart reviewed as part of pre-operative protocol coverage. Because of Monica Anderson's past medical history and time since last visit, she will require a follow-up in-office visit in order to better assess preoperative cardiovascular risk.  Pre-op covering staff: - Please schedule appointment and call patient to inform them. If patient already had an upcoming appointment within acceptable timeframe, please add pre-op clearance to the appointment notes so provider is aware. - Please contact requesting surgeon's office via preferred method (i.e, phone, fax) to inform them of need for appointment prior to surgery.  Pt is scheduled for upcoming Afib ablation and is pending TKA in Sept. Will need uninterrupted OAC for 90 days after ablation.   Given upcoming procedure and past medical history, will request in office visit in August for clearance and final OAC hold.     Warren Haber Sidney Silberman, PA  11/15/2023, 11:51 AM

## 2023-11-15 NOTE — Telephone Encounter (Signed)
 Preop clearance now scheduled.

## 2023-11-16 NOTE — Pre-Procedure Instructions (Addendum)
 Instructed patient on the following items: Arrival time 0515 Nothing to eat or drink after midnight No meds AM of procedure Responsible person to drive you home and stay with you for 24 hrs  Have you missed any doses of anti-coagulant Eliquis- takes twice a day, hasn't missed any doses in last 4 weeks.  Don't take dose morning of procedure.

## 2023-11-17 ENCOUNTER — Ambulatory Visit (HOSPITAL_COMMUNITY): Admitting: Certified Registered Nurse Anesthetist

## 2023-11-17 ENCOUNTER — Encounter (HOSPITAL_COMMUNITY): Payer: Self-pay | Admitting: Cardiology

## 2023-11-17 ENCOUNTER — Ambulatory Visit (HOSPITAL_COMMUNITY)
Admission: RE | Admit: 2023-11-17 | Discharge: 2023-11-17 | Disposition: A | Discharge status: Home / Self Care | Attending: Cardiology | Admitting: Cardiology

## 2023-11-17 ENCOUNTER — Ambulatory Visit (HOSPITAL_COMMUNITY)
Admission: RE | Disposition: A | Discharge status: Home / Self Care | Payer: Self-pay | Source: Home / Self Care | Attending: Cardiology

## 2023-11-17 ENCOUNTER — Encounter: Payer: Self-pay | Admitting: Emergency Medicine

## 2023-11-17 ENCOUNTER — Other Ambulatory Visit: Payer: Self-pay

## 2023-11-17 DIAGNOSIS — K219 Gastro-esophageal reflux disease without esophagitis: Secondary | ICD-10-CM | POA: Diagnosis not present

## 2023-11-17 DIAGNOSIS — E785 Hyperlipidemia, unspecified: Secondary | ICD-10-CM | POA: Diagnosis not present

## 2023-11-17 DIAGNOSIS — I4891 Unspecified atrial fibrillation: Secondary | ICD-10-CM

## 2023-11-17 DIAGNOSIS — I10 Essential (primary) hypertension: Secondary | ICD-10-CM

## 2023-11-17 DIAGNOSIS — E782 Mixed hyperlipidemia: Secondary | ICD-10-CM | POA: Diagnosis not present

## 2023-11-17 DIAGNOSIS — I480 Paroxysmal atrial fibrillation: Secondary | ICD-10-CM | POA: Diagnosis not present

## 2023-11-17 DIAGNOSIS — I4892 Unspecified atrial flutter: Secondary | ICD-10-CM

## 2023-11-17 DIAGNOSIS — I483 Typical atrial flutter: Secondary | ICD-10-CM

## 2023-11-17 HISTORY — PX: ATRIAL FIBRILLATION ABLATION: EP1191

## 2023-11-17 HISTORY — PX: A-FLUTTER ABLATION: EP1230

## 2023-11-17 LAB — POCT ACTIVATED CLOTTING TIME: Activated Clotting Time: 273 s

## 2023-11-17 MED ORDER — FENTANYL CITRATE (PF) 100 MCG/2ML IJ SOLN
INTRAMUSCULAR | Status: AC
Start: 2023-11-17 — End: 2023-11-17
  Filled 2023-11-17: qty 2

## 2023-11-17 MED ORDER — FENTANYL CITRATE (PF) 250 MCG/5ML IJ SOLN
INTRAMUSCULAR | Status: DC | PRN
  Administered 2023-11-17: 50 ug via INTRAVENOUS

## 2023-11-17 MED ORDER — HEPARIN SODIUM (PORCINE) 1000 UNIT/ML IJ SOLN
INTRAMUSCULAR | Status: DC | PRN
  Administered 2023-11-17: 14000 [IU] via INTRAVENOUS
  Administered 2023-11-17: 6000 [IU] via INTRAVENOUS

## 2023-11-17 MED ORDER — ONDANSETRON HCL 4 MG/2ML IJ SOLN
4.0000 mg | Freq: Four times a day (QID) | INTRAMUSCULAR | Status: DC | PRN
Start: 2023-11-17 — End: 2023-11-17

## 2023-11-17 MED ORDER — SUGAMMADEX SODIUM 200 MG/2ML IV SOLN
INTRAVENOUS | Status: DC | PRN
Start: 2023-11-17 — End: 2023-11-17
  Administered 2023-11-17: 200 mg via INTRAVENOUS

## 2023-11-17 MED ORDER — SODIUM CHLORIDE 0.9% FLUSH: 3.0000 mL | Freq: Two times a day (BID) | INTRAVENOUS | Status: DC

## 2023-11-17 MED ORDER — LIDOCAINE 2% (20 MG/ML) 5 ML SYRINGE
INTRAMUSCULAR | Status: DC | PRN
  Administered 2023-11-17: 100 mg via INTRAVENOUS

## 2023-11-17 MED ORDER — SODIUM CHLORIDE 0.9 % IV SOLN: 250.0000 mL | INTRAVENOUS | Status: DC | PRN

## 2023-11-17 MED ORDER — DEXAMETHASONE SODIUM PHOSPHATE 10 MG/ML IJ SOLN
INTRAMUSCULAR | Status: DC | PRN
  Administered 2023-11-17: 8 mg via INTRAVENOUS

## 2023-11-17 MED ORDER — ONDANSETRON HCL 4 MG/2ML IJ SOLN
INTRAMUSCULAR | Status: DC | PRN
Start: 2023-11-17 — End: 2023-11-17
  Administered 2023-11-17: 4 mg via INTRAVENOUS

## 2023-11-17 MED ORDER — HEPARIN (PORCINE) IN NACL 1000-0.9 UT/500ML-% IV SOLN
INTRAVENOUS | Status: DC | PRN
  Administered 2023-11-17 (×3): 500 mL

## 2023-11-17 MED ORDER — ACETAMINOPHEN 325 MG PO TABS: 650.0000 mg | ORAL_TABLET | ORAL | Status: DC | PRN

## 2023-11-17 MED ORDER — ROCURONIUM BROMIDE 10 MG/ML (PF) SYRINGE
PREFILLED_SYRINGE | INTRAVENOUS | Status: DC | PRN
  Administered 2023-11-17: 60 mg via INTRAVENOUS

## 2023-11-17 MED ORDER — ATROPINE SULFATE 1 MG/ML IV SOLN
INTRAVENOUS | Status: DC | PRN
  Administered 2023-11-17: 1 mg via INTRAVENOUS

## 2023-11-17 MED ORDER — PROTAMINE SULFATE 10 MG/ML IV SOLN
INTRAVENOUS | Status: DC | PRN
  Administered 2023-11-17: 40 mg via INTRAVENOUS

## 2023-11-17 MED ORDER — PROPOFOL 10 MG/ML IV BOLUS
INTRAVENOUS | Status: DC | PRN
Start: 2023-11-17 — End: 2023-11-17
  Administered 2023-11-17: 100 mg via INTRAVENOUS

## 2023-11-17 MED ORDER — SODIUM CHLORIDE 0.9% FLUSH: 3.0000 mL | INTRAVENOUS | Status: DC | PRN

## 2023-11-17 MED ORDER — PHENYLEPHRINE HCL-NACL 20-0.9 MG/250ML-% IV SOLN
INTRAVENOUS | Status: DC | PRN
  Administered 2023-11-17: 35 ug/min via INTRAVENOUS

## 2023-11-17 MED ORDER — SODIUM CHLORIDE 0.9 % IV SOLN: INTRAVENOUS | Status: DC

## 2023-11-17 MED ORDER — ATROPINE SULFATE 1 MG/10ML IJ SOSY
PREFILLED_SYRINGE | INTRAMUSCULAR | Status: AC
  Filled 2023-11-17: qty 10

## 2023-11-17 NOTE — Progress Notes (Signed)
 Patient and daughter was given discharge instructions. Both verbalized understanding.

## 2023-11-17 NOTE — H&P (Signed)
  Electrophysiology Office Note:   Date:  11/17/2023  ID:  Monica Anderson, DOB 11-May-1946, MRN [id number]  Primary Cardiologist: Jerryl Morin, DO Primary Heart Failure: None Electrophysiologist: Joedy Eickhoff Cortland Ding, MD      History of Present Illness:   Monica Anderson is a 78 y.o. female with h/o coronary disease, hypertrophic cardiomyopathy, hypertension, hyperlipidemia, Mnire's disease, atrial fibrillation seen today for  for Electrophysiology evaluation of atrial fibrillation at the request of Myrtha Ates.    She has a history of both atrial fibrillation and atrial flutter.  She wore a ZIO monitor that showed no episodes of atrial fibrillation.  She has symptoms of fatigue and shortness of breath.  Today, denies symptoms of palpitations, chest pain, dyspnea, orthopnea, PND, lower extremity edema, claudication, dizziness, presyncope, syncope, bleeding, or neurologic sequela. The patient is tolerating medications without difficulties. Plan ablation today.   EP Information / Studies Reviewed:    EKG is not ordered today. EKG from 09/04/2023 reviewed which showed atrial flutter        Risk Assessment/Calculations:    CHA2DS2-VASc Score = 4   This indicates a 4.8% annual risk of stroke. The patient's score is based upon: CHF History: 0 HTN History: 1 Diabetes History: 0 Stroke History: 0 Vascular Disease History: 0 Age Score: 2 Gender Score: 1             Physical Exam:   VS:  BP (!) 138/59   Pulse (!) 56   Temp 98.4 F (36.9 C) (Oral)   Resp 16   Ht 5' 6 (1.676 m)   Wt 94.3 kg   LMP  (LMP Unknown)   SpO2 94%   BMI 33.57 kg/m    Wt Readings from Last 3 Encounters:  11/17/23 94.3 kg  10/31/23 94.5 kg  09/11/23 96.3 kg    GEN: No acute distress.   Neck: No JVD Cardiac: RRR, no murmurs, rubs, or gallops.  Respiratory: normal BS bilaterally. GI: Soft, nontender, non-distended  MS: No edema; No deformity. Neuro:  Nonfocal  Skin: warm and dry,  Psych: Normal  affect  ASSESSMENT AND PLAN:    1.  Paroxysmal atrial fibrillation/flutter: Monica Anderson has presented today for surgery, with the diagnosis of AF.  The various methods of treatment have been discussed with the patient and family. After consideration of risks, benefits and other options for treatment, the patient has consented to  Procedure(s): Catheter ablation as a surgical intervention .  Risks include but not limited to complete heart block, stroke, esophageal damage, nerve damage, bleeding, vascular damage, tamponade, perforation, MI, and death. The patient's history has been reviewed, patient examined, no change in status, stable for surgery.  I have reviewed the patient's chart and labs.  Questions were answered to the patient's satisfaction.    Monica Anderson Monica Pray, MD 11/17/2023 7:04 AM

## 2023-11-17 NOTE — Transfer of Care (Signed)
 Immediate Anesthesia Transfer of Care Note  Patient: Monica Anderson  Procedure(s) Performed: ATRIAL FIBRILLATION ABLATION A-FLUTTER ABLATION  Patient Location: PACU  Anesthesia Type:General  Level of Consciousness: awake and alert   Airway & Oxygen Therapy: Patient Spontanous Breathing and Patient connected to nasal cannula oxygen  Post-op Assessment: Report given to RN and Post -op Vital signs reviewed and stable  Post vital signs: Reviewed and stable  Last Vitals:  Vitals Value Taken Time  BP 109/53 11/17/23 09:15  Temp    Pulse 61 11/17/23 09:17  Resp 21 11/17/23 09:17  SpO2 98 % 11/17/23 09:17  Vitals shown include unfiled device data.  Last Pain:  Vitals:   11/17/23 0554  TempSrc: Oral  PainSc: 0-No pain      Patients Stated Pain Goal: 4 (11/17/23 0547)  Complications: There were no known notable events for this encounter.

## 2023-11-17 NOTE — Anesthesia Preprocedure Evaluation (Signed)
 Anesthesia Evaluation  Patient identified by MRN, date of birth, ID band Patient awake    Reviewed: Allergy & Precautions, NPO status , Patient's Chart, lab work & pertinent test results, reviewed documented beta blocker date and time   History of Anesthesia Complications Negative for: history of anesthetic complications  Airway Mallampati: III  TM Distance: >3 FB Neck ROM: Full    Dental  (+) Dental Advisory Given   Pulmonary neg pulmonary ROS   Pulmonary exam normal breath sounds clear to auscultation       Cardiovascular hypertension, Pt. on medications and Pt. on home beta blockers (-) angina (-) Past MI and (-) Cardiac Stents + dysrhythmias Atrial Fibrillation  Rhythm:Regular Rate:Normal  HLD  TTE 03/21/2022: There is severe concentric left ventricular hypertrophy with septal and  posterior wall thickness ~ 1.5 cm.  Apical hypertrophy appears more predominant on contrast study.  No evidence of LVOT obstruction.  LVEF 65-70%  Mild tricuspid regurgitation   CT Coronary 03/09/2020: IMPRESSION: 1. Coronary calcium  score of 3. This was 31st percentile for age and sex matched control.   2. Normal coronary origin with right dominance.   3. Nonobstructive CAD with calcified plaque in the mid LAD and mid D1 causing minimal (0-24%) stenosis     Neuro/Psych neg Seizures Meniere's disease    GI/Hepatic Neg liver ROS,GERD  Medicated,,  Endo/Other  negative endocrine ROS    Renal/GU negative Renal ROS     Musculoskeletal  (+) Arthritis , Osteoarthritis,    Abdominal  (+) + obese  Peds  Hematology  (+) Blood dyscrasia, anemia   Anesthesia Other Findings 78 year old female with a history of apical variant hypertrophic cardiomyopathy (noted on echo and confirmed on cardiac MR), paroxysmal atrial fibrillation diagnosed in 2015 on diltiazem  and Eliquis , hypertension, hyperlipidemia, Mnire's disease, and BPPV   Reproductive/Obstetrics                             Anesthesia Physical Anesthesia Plan  ASA: 3  Anesthesia Plan: General   Post-op Pain Management: Minimal or no pain anticipated   Induction: Intravenous  PONV Risk Score and Plan: 3 and Ondansetron , Dexamethasone , Treatment may vary due to age or medical condition and Midazolam   Airway Management Planned: Oral ETT  Additional Equipment: ClearSight  Intra-op Plan:   Post-operative Plan: Extubation in OR  Informed Consent: I have reviewed the patients History and Physical, chart, labs and discussed the procedure including the risks, benefits and alternatives for the proposed anesthesia with the patient or authorized representative who has indicated his/her understanding and acceptance.     Dental advisory given  Plan Discussed with: CRNA and Anesthesiologist  Anesthesia Plan Comments: ( Risks of general anesthesia discussed including, but not limited to, sore throat, hoarse voice, chipped/damaged teeth, injury to vocal cords, nausea and vomiting, allergic reactions, lung infection, heart attack, stroke, and death. All questions answered.   See PAT note 07/08/22)       Anesthesia Quick Evaluation

## 2023-11-17 NOTE — Discharge Instructions (Signed)

## 2023-11-17 NOTE — Anesthesia Procedure Notes (Signed)
 Procedure Name: Intubation Date/Time: 11/17/2023 7:47 AM  Performed by: Alphia Jasmine, CRNAPre-anesthesia Checklist: Patient identified, Emergency Drugs available, Suction available, Timeout performed and Patient being monitored Patient Re-evaluated:Patient Re-evaluated prior to induction Oxygen Delivery Method: Circle system utilized Preoxygenation: Pre-oxygenation with 100% oxygen Induction Type: IV induction Ventilation: Mask ventilation without difficulty Laryngoscope Size: Mac and 3 Grade View: Grade I Tube type: Oral Tube size: 7.0 mm Number of attempts: 1 Airway Equipment and Method: Stylet Placement Confirmation: ETT inserted through vocal cords under direct vision, positive ETCO2, CO2 detector and breath sounds checked- equal and bilateral Secured at: 22 cm Tube secured with: Tape Dental Injury: Teeth and Oropharynx as per pre-operative assessment

## 2023-11-17 NOTE — Anesthesia Postprocedure Evaluation (Signed)
 Anesthesia Post Note  Patient: Monica Anderson  Procedure(s) Performed: ATRIAL FIBRILLATION ABLATION A-FLUTTER ABLATION     Patient location during evaluation: PACU Anesthesia Type: General Level of consciousness: awake and alert Pain management: pain level controlled Vital Signs Assessment: post-procedure vital signs reviewed and stable Respiratory status: spontaneous breathing, nonlabored ventilation and respiratory function stable Cardiovascular status: blood pressure returned to baseline and stable Postop Assessment: no apparent nausea or vomiting Anesthetic complications: no   There were no known notable events for this encounter.  Last Vitals:  Vitals:   11/17/23 1000 11/17/23 1015  BP: (!) 119/55 122/61  Pulse: 60 60  Resp: 15 (!) 9  Temp:    SpO2: 92% (!) 89%    Last Pain:  Vitals:   11/17/23 0958  TempSrc:   PainSc: 0-No pain   Pain Goal: Patients Stated Pain Goal: 4 (11/17/23 0547)  LLE Motor Response: Purposeful movement (11/17/23 0958)   RLE Motor Response: Purposeful movement (11/17/23 0958)          Earvin Goldberg

## 2023-11-18 ENCOUNTER — Encounter (HOSPITAL_COMMUNITY): Payer: Self-pay | Admitting: Cardiology

## 2023-11-19 ENCOUNTER — Other Ambulatory Visit: Payer: Self-pay | Admitting: Cardiology

## 2023-11-20 MED FILL — Atropine Sulfate Soln Prefill Syr 1 MG/10ML (0.1 MG/ML): INTRAMUSCULAR | Qty: 10 | Status: AC

## 2023-11-20 MED FILL — Fentanyl Citrate Preservative Free (PF) Inj 100 MCG/2ML: INTRAMUSCULAR | Qty: 1 | Status: AC

## 2023-11-28 ENCOUNTER — Encounter: Payer: Self-pay | Admitting: Cardiology

## 2023-12-18 ENCOUNTER — Encounter (HOSPITAL_COMMUNITY): Payer: Self-pay | Admitting: Physician Assistant

## 2023-12-18 ENCOUNTER — Ambulatory Visit (HOSPITAL_COMMUNITY)
Admit: 2023-12-18 | Discharge: 2023-12-18 | Disposition: A | Discharge status: Home / Self Care | Source: Ambulatory Visit | Attending: Physician Assistant | Admitting: Physician Assistant

## 2023-12-18 VITALS — BP 146/80 | HR 55 | Ht 66.0 in | Wt 209.6 lb

## 2023-12-18 DIAGNOSIS — I483 Typical atrial flutter: Secondary | ICD-10-CM | POA: Diagnosis not present

## 2023-12-18 DIAGNOSIS — I4719 Other supraventricular tachycardia: Secondary | ICD-10-CM | POA: Insufficient documentation

## 2023-12-18 DIAGNOSIS — I480 Paroxysmal atrial fibrillation: Secondary | ICD-10-CM | POA: Insufficient documentation

## 2023-12-18 DIAGNOSIS — E782 Mixed hyperlipidemia: Secondary | ICD-10-CM | POA: Diagnosis not present

## 2023-12-18 DIAGNOSIS — Z7901 Long term (current) use of anticoagulants: Secondary | ICD-10-CM | POA: Insufficient documentation

## 2023-12-18 DIAGNOSIS — Z5181 Encounter for therapeutic drug level monitoring: Secondary | ICD-10-CM | POA: Insufficient documentation

## 2023-12-18 DIAGNOSIS — I4892 Unspecified atrial flutter: Secondary | ICD-10-CM | POA: Insufficient documentation

## 2023-12-18 DIAGNOSIS — Z79899 Other long term (current) drug therapy: Secondary | ICD-10-CM | POA: Insufficient documentation

## 2023-12-18 DIAGNOSIS — I10 Essential (primary) hypertension: Secondary | ICD-10-CM | POA: Insufficient documentation

## 2023-12-18 DIAGNOSIS — I2510 Atherosclerotic heart disease of native coronary artery without angina pectoris: Secondary | ICD-10-CM | POA: Diagnosis not present

## 2023-12-18 DIAGNOSIS — D6869 Other thrombophilia: Secondary | ICD-10-CM | POA: Insufficient documentation

## 2023-12-18 NOTE — Progress Notes (Signed)
 Primary Care Physician: Clemmie Nest, MD Primary Cardiologist: Kardie Tobb, DO Electrophysiologist: Will Gladis Norton, MD  Referring Physician: Damien Braver NP   Monica Anderson is a 78 y.o. female with a history of CAD, HCM, HTN, HLD, Meniere's disease, atrial fibrillation who presents for follow up in the [facility].  The patient was initially diagnosed with atrial fibrillation 2015 and is on Eliquis  for stroke prevention. She was seen by Damien Braver 07/28/23 and found to be in coarse afib vs atypical atrial flutter. She wore a Zio monitor which showed no afib however, patient presented for follow up back in afib. She does have symptoms of fatigue and SOB when in afib. She was started on amiodarone  as a bridge to ablation. S/p afib and flutter ablation with Dr Shelton on 11/17/23.  Patient returns for follow up for atrial fibrillation and amiodarone  monitoring. She reports that she has done well since the ablation with no interim symptoms of afib. She denies chest pain or groin issues. She does report some nausea after taking her dose of amiodarone .   Today, she  denies symptoms of palpitations, chest pain, shortness of breath, orthopnea, PND, lower extremity edema, dizziness, presyncope, syncope, snoring, daytime somnolence, bleeding, or neurologic sequela. The patient is tolerating medications without difficulties and is otherwise without complaint today.    Atrial Fibrillation Risk Factors:  she does not have symptoms or diagnosis of sleep apnea. she does not have a history of rheumatic fever.   Atrial Fibrillation Management history:  Previous antiarrhythmic drugs: amiodarone   Previous cardioversions: none Previous ablations: 11/17/23 Anticoagulation history: Eliquis   ROS- All systems are reviewed and negative except as per the HPI above.  Past Medical History:  Diagnosis Date   Anemia    Arthritis    Asymmetrical left sensorineural hearing  loss 10/01/2019   Atrial fibrillation (HCC) 10/02/2019   Essential hypertension 10/03/2019   History of colon polyps    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, left (HCC) 01/17/2017   Meniere's disease 08/29/2019   with hearing loss in Lt ear   Mixed hyperlipidemia 10/03/2019   Multiple dysplastic nevi    Other hypertrophic cardiomyopathy (HCC)    Pleural effusion     Current Outpatient Medications  Medication Sig Dispense Refill   amiodarone  (PACERONE ) 200 MG tablet Take 1 tablet (200 mg total) by mouth 2 (two) times daily for 30 days, THEN 1 tablet (200 mg total) daily. 90 tablet 1   buPROPion  (WELLBUTRIN  XL) 150 MG 24 hr tablet Take 150 mg by mouth in the morning.     Calcium  Carb-Cholecalciferol (CALCIUM  600+D3 PO) Take 1 tablet by mouth in the morning.     carvedilol  (COREG ) 6.25 MG tablet Take 1 tablet (6.25 mg total) by mouth 2 (two) times daily. 180 tablet 3   diltiazem  (CARDIZEM  CD) 240 MG 24 hr capsule TAKE 1 CAPSULE BY MOUTH EVERY DAY 90 capsule 1   ELIQUIS  5 MG TABS tablet Take 5 mg by mouth 2 (two) times daily.  3   estradiol (ESTRACE) 0.1 MG/GM vaginal cream Place 1 Applicatorful vaginally daily as needed (Pt prefrence).     famotidine  (PEPCID ) 20 MG tablet Take 20 mg by mouth at bedtime.     FARXIGA  10 MG TABS tablet Take 10 mg by mouth daily.     ketoconazole (NIZORAL) 2 % cream Apply 1 Application topically 2 (two) times daily as needed (irritation.).     montelukast  (SINGULAIR ) 10 MG  tablet Take 10 mg by mouth at bedtime.     Multiple Vitamins-Minerals (PRESERVISION AREDS 2 PO) Take 1 tablet by mouth in the morning.     nitrofurantoin, macrocrystal-monohydrate, (MACROBID) 100 MG capsule Take 100 mg by mouth daily.     Omega-3 Fatty Acids (OMEGA-3 FISH OIL) 1200 MG CAPS Take 1,200 mg by mouth every evening.     rosuvastatin  (CRESTOR ) 20 MG tablet Take 1 tablet (20 mg total) by mouth every evening. 90 tablet 1   spironolactone -hydrochlorothiazide   (ALDACTAZIDE) 25-25 MG tablet Take 0.5 tablets by mouth in the morning.     No current facility-administered medications for this encounter.    Physical Exam: BP (!) 146/80   Pulse (!) 55   Ht 5' 6 (1.676 m)   Wt 95.1 kg   LMP  (LMP Unknown)   BMI 33.83 kg/m   GEN: Well nourished, well developed in no acute distress CARDIAC: Regular rate and rhythm, no murmurs, rubs, gallops RESPIRATORY:  Clear to auscultation without rales, wheezing or rhonchi  ABDOMEN: Soft, non-tender, non-distended EXTREMITIES:  No edema; No deformity    Wt Readings from Last 3 Encounters:  12/18/23 95.1 kg  11/17/23 94.3 kg  10/31/23 94.5 kg     EKG today demonstrates  SB, LVH Vent. rate 55 BPM PR interval 144 ms QRS duration 90 ms QT/QTcB 466/445 ms   Echo 07/08/23     CHA2DS2-VASc Score = 4  The patient's score is based upon: CHF History: 0 HTN History: 1 Diabetes History: 0 Stroke History: 0 Vascular Disease History: 0 Age Score: 2 Gender Score: 1       ASSESSMENT AND PLAN: Paroxysmal Atrial Fibrillation/atrial flutter (ICD10:  I48.0) The patient's CHA2DS2-VASc score is 4, indicating a 4.8% annual risk of stroke.   S/p afib and flutter ablation 11/17/23 Patient appears to be maintaining SR Given her nausea, will stop amiodarone  today.  Continue carvedilol  6.25 mg BID Continue diltiazem  240 mg daily Continue Eliquis  5 mg BID with no missed doses for 3 months post ablation.   Secondary Hypercoagulable State (ICD10:  D68.69) The patient is at significant risk for stroke/thromboembolism based upon her CHA2DS2-VASc Score of 4.  Continue Apixaban  (Eliquis ). No bleeding issues.   High Risk Medication Monitoring (ICD 10: Z79.899) Stop amiodarone  today.   CAD No anginal symptoms Followed by Dr Sheena  HTN Stable on current regimen  HCM Followed by Dr Sheena   Follow up in the AF clinic in 2 months.          Daril Kicks PA-C [facility] [address] [place], [place] [address] [phone]

## 2023-12-28 ENCOUNTER — Other Ambulatory Visit: Payer: Self-pay | Admitting: Cardiology

## 2023-12-29 ENCOUNTER — Telehealth: Payer: Self-pay

## 2023-12-29 MED ORDER — DILTIAZEM HCL ER COATED BEADS 240 MG PO CP24: 240.0000 mg | ORAL_CAPSULE | Freq: Every day | ORAL | 1 refills | Status: DC

## 2023-12-29 NOTE — Telephone Encounter (Signed)
 Cardizem  240 mg daily reordered per Dr. Ginette note 10/31/23.

## 2024-01-12 ENCOUNTER — Other Ambulatory Visit (HOSPITAL_COMMUNITY): Payer: Self-pay | Admitting: Physician Assistant

## 2024-01-23 ENCOUNTER — Encounter: Payer: Self-pay | Admitting: Cardiology

## 2024-01-27 ENCOUNTER — Encounter: Payer: Self-pay | Admitting: Cardiology

## 2024-01-29 DIAGNOSIS — M25562 Pain in left knee: Secondary | ICD-10-CM | POA: Diagnosis not present

## 2024-01-29 MED ORDER — CARVEDILOL 6.25 MG PO TABS: 6.2500 mg | ORAL_TABLET | Freq: Two times a day (BID) | ORAL | 3 refills | Status: AC

## 2024-01-29 NOTE — H&P (Signed)
 TOTAL KNEE ADMISSION H&P  Patient is being admitted for left total knee arthroplasty.  Subjective:  Chief Complaint: Left knee pain.  HPI: Monica Anderson, 78 y.o. female has a history of pain and functional disability in the left knee due to arthritis and has failed non-surgical conservative treatments for greater than 12 weeks to include corticosteriod injections, viscosupplementation injections, and activity modification. Onset of symptoms was gradual, starting several years ago with gradually worsening course since that time. The patient noted no past surgery on the left knee.  Patient currently rates pain in the left knee at 8 out of 10 with activity. Patient has worsening of pain with activity and weight bearing and pain that interferes with activities of daily living. Patient has evidence of periarticular osteophytes and joint space narrowing by imaging studies. There is no active infection.  Patient Active Problem List   Diagnosis Date Noted   Hypercoagulable state due to paroxysmal atrial fibrillation (HCC) 08/21/2023   Abnormal cardiovascular stress test 07/10/2023   Unstable angina (HCC) 07/10/2023   OA (osteoarthritis) of knee 07/18/2022   Osteoarthritis of right knee 05/11/2020   Osteoarthritis of left knee 03/18/2020   Nonspecific abnormal electrocardiogram (ECG) (EKG) 02/18/2020   Nonsustained ventricular tachycardia (HCC) 02/18/2020   Abnormal electrocardiogram 02/18/2020   PAT (paroxysmal atrial tachycardia) (HCC) 02/18/2020   Dizziness 01/07/2020   Apical variant hypertrophic cardiomyopathy (HCC) 01/07/2020   Acquired trigger finger of left middle finger 12/12/2019   Essential hypertension 10/03/2019   Mixed hyperlipidemia 10/03/2019   Atrial fibrillation (HCC) 10/02/2019   History of appendectomy 10/02/2019   Tubal ligation evaluation 10/02/2019   Asymmetrical left sensorineural hearing loss 10/01/2019   Meniere's disease 08/29/2019   History of malignant melanoma  of skin 06/16/2017   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, left (HCC) 01/17/2017   History of atypical nevus 12/14/2016    Past Medical History:  Diagnosis Date   Anemia    Arthritis    Asymmetrical left sensorineural hearing loss 10/01/2019   Atrial fibrillation (HCC) 10/02/2019   Essential hypertension 10/03/2019   History of colon polyps    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, left (HCC) 01/17/2017   Meniere's disease 08/29/2019   with hearing loss in Lt ear   Mixed hyperlipidemia 10/03/2019   Multiple dysplastic nevi    Other hypertrophic cardiomyopathy (HCC)    Pleural effusion     Past Surgical History:  Procedure Laterality Date   A-FLUTTER ABLATION N/A 11/17/2023   Procedure: A-FLUTTER ABLATION;  Surgeon: Inocencio Soyla Lunger, MD;  Location: [facility];  Service: Cardiovascular;  Laterality: N/A;   APPENDECTOMY  1958   ATRIAL FIBRILLATION ABLATION N/A 11/17/2023   Procedure: ATRIAL FIBRILLATION ABLATION;  Surgeon: Inocencio Soyla Lunger, MD;  Location: [facility];  Service: Cardiovascular;  Laterality: N/A;   BREAST BIOPSY  1973   CATARACT EXTRACTION, BILATERAL  2019   COLONOSCOPY  11/07/2016   Colonic polyp status post polypectomy. Minimal sigmoid diverticulosis   CORONARY PRESSURE/FFR STUDY N/A 07/10/2023   Procedure: CORONARY PRESSURE/FFR STUDY;  Surgeon: Mady Bruckner, MD;  Location: [facility];  Service: Cardiovascular;  Laterality: N/A;   DILATION AND CURETTAGE OF UTERUS  1968   LEFT HEART CATH AND CORONARY ANGIOGRAPHY N/A 07/10/2023   Procedure: LEFT HEART CATH AND CORONARY ANGIOGRAPHY;  Surgeon: Mady Bruckner, MD;  Location: [facility];  Service: Cardiovascular;  Laterality: N/A;   MOHS SURGERY Left 2019  Melanoma on left ear   TOTAL KNEE ARTHROPLASTY Right 07/18/2022   Procedure: TOTAL KNEE ARTHROPLASTY;  Surgeon: Melodi Lerner, MD;  Location: [facility];  Service: Orthopedics;   Laterality: Right;   TUBAL LIGATION  1977    Prior to Admission medications   Medication Sig Start Date End Date Taking? Authorizing Provider  buPROPion  (WELLBUTRIN  XL) 150 MG 24 hr tablet Take 150 mg by mouth in the morning.    [provider]  Calcium  Carb-Cholecalciferol (CALCIUM  600+D3 PO) Take 1 tablet by mouth in the morning.    [provider]  carvedilol  (COREG ) 6.25 MG tablet Take 1 tablet (6.25 mg total) by mouth 2 (two) times daily. 01/29/24   Tobb, Kardie, DO  diltiazem  (CARDIZEM  CD) 240 MG 24 hr capsule Take 1 capsule (240 mg total) by mouth daily. 12/29/23   Tobb, Kardie, DO  ELIQUIS  5 MG TABS tablet Take 5 mg by mouth 2 (two) times daily. 01/16/18   [provider]  estradiol (ESTRACE) 0.1 MG/GM vaginal cream Place 1 Applicatorful vaginally daily as needed (Pt prefrence).    [provider]  famotidine  (PEPCID ) 20 MG tablet Take 20 mg by mouth at bedtime. 08/09/21   [provider]  FARXIGA  10 MG TABS tablet Take 10 mg by mouth daily. 10/26/23   [provider]  ketoconazole (NIZORAL) 2 % cream Apply 1 Application topically 2 (two) times daily as needed (irritation.).    [provider]  montelukast  (SINGULAIR ) 10 MG tablet Take 10 mg by mouth at bedtime. 08/09/21   [provider]  Multiple Vitamins-Minerals (PRESERVISION AREDS 2 PO) Take 1 tablet by mouth in the morning.    [provider]  nitrofurantoin, macrocrystal-monohydrate, (MACROBID) 100 MG capsule Take 100 mg by mouth daily. 05/14/23   [provider]  Omega-3 Fatty Acids (OMEGA-3 FISH OIL) 1200 MG CAPS Take 1,200 mg by mouth every evening.    [provider]  rosuvastatin  (CRESTOR ) 20 MG tablet TAKE 1 TABLET BY MOUTH EVERY DAY IN THE EVENING 12/28/23   Henry Manuelita NOVAK, NP  spironolactone -hydrochlorothiazide  (ALDACTAZIDE) 25-25 MG tablet Take 0.5 tablets by mouth in the morning.    [provider]    No Known  Allergies  Social History   Socioeconomic History   Marital status: Married    Spouse name: Not on file   Number of children: 3   Years of education: Not on file   Highest education level: Not on file  Occupational History   Occupation: retired  Tobacco Use   Smoking status: Never   Smokeless tobacco: Never   Tobacco comments:    Never smoked 08/21/23  Vaping Use   Vaping status: Never Used  Substance and Sexual Activity   Alcohol use: Yes    Comment: Wine maybe 2-3 times a year   Drug use: Never   Sexual activity: Not Currently    Partners: Male    Comment: Married  Other Topics Concern   Not on file  Social History Narrative   Right handed    caffeine: 1 cup of decaf in the AM   Lives with husband and one son   Social Drivers of Corporate investment banker Strain: Low Risk  (03/20/2022)   Received from [facility]   Overall Financial Resource Strain (CARDIA)    Difficulty of Paying Living Expenses: Not hard at all  Food Insecurity: No Food Insecurity (07/11/2023)   Hunger Vital Sign    Worried About Running Out  of Food in the Last Year: Never true    Ran Out of Food in the Last Year: Never true  Transportation Needs: No Transportation Needs (07/11/2023)   PRAPARE - Administrator, Civil Service (Medical): No    Lack of Transportation (Non-Medical): No  Physical Activity: Not on file  Stress: No Stress Concern Present (03/20/2022)   Received from [facility] of Occupational Health - Occupational Stress Questionnaire    Feeling of Stress : Not at all  Social Connections: Moderately Integrated (07/11/2023)   Social Connection and Isolation Panel    Frequency of Communication with Friends and Family: More than three times a week    Frequency of Social Gatherings with Friends and Family: More than three times a week    Attends Religious Services: More than 4 times per year    Active Member of [employer] or Organizations: No    Attends  Banker Meetings: Never    Marital Status: Married  Catering manager Violence: Not At Risk (07/11/2023)   Humiliation, Afraid, Rape, and Kick questionnaire    Fear of Current or Ex-Partner: No    Emotionally Abused: No    Physically Abused: No    Sexually Abused: No    Tobacco Use: Low Risk  (12/18/2023)   Patient History    Smoking Tobacco Use: Never    Smokeless Tobacco Use: Never    Passive Exposure: Not on file   Social History   Substance and Sexual Activity  Alcohol Use Yes   Comment: Wine maybe 2-3 times a year    Family History  Problem Relation Age of Onset   Hypertension Mother    Atrial fibrillation Mother    Hypertension Father    Stroke Father    Diabetes Father    Colon cancer Neg Hx    Liver cancer Neg Hx    Stomach cancer Neg Hx    Rectal cancer Neg Hx    Esophageal cancer Neg Hx     ROS  Objective:  Physical Exam: Well-developed female, alert and oriented, in no apparent distress.  Evaluation of her left hip shows normal range of motion, no discomfort.  Left knee shows no effusion. Range of motion left knee is 0 to 130 degrees.  There is crepitus on range of motion. She has a slight varus deformity.  She is tender medial greater than lateral.  There is no instability noted about the knee.  Her gait pattern is antalgic on the left.  IMAGING: Reviewed radiographs from 06/02/2023 showing bone-on-bone arthritis in the medial and patellofemoral compartments of the left knee. The right knee replacement is in great position with no abnormalities.  Assessment/Plan:  End stage arthritis, left knee   The patient history, physical examination, clinical judgment of the provider and imaging studies are consistent with end stage degenerative joint disease of the left knee and total knee arthroplasty is deemed medically necessary. The treatment options including medical management, injection therapy arthroscopy and arthroplasty were discussed at  length. The risks and benefits of total knee arthroplasty were presented and reviewed. The risks due to aseptic loosening, infection, stiffness, patella tracking problems, thromboembolic complications and other imponderables were discussed. The patient acknowledged the explanation, agreed to proceed with the plan and consent was signed. Patient is being admitted for inpatient treatment for surgery, pain control, PT, OT, prophylactic antibiotics, VTE prophylaxis, progressive ambulation and ADLs and discharge planning. The patient is planning to be discharged home.  Patient's anticipated LOS is less than 2 midnights, meeting these requirements: - Younger than 13 - Lives within 1 hour of care - Has a competent adult at home to recover with post-op recover - NO history of  - Chronic pain requiring opiods  - Diabetes  - Coronary Artery Disease  - Heart failure  - Heart attack  - Stroke  - DVT/VTE  - Respiratory Failure/COPD  - Renal failure  - Anemia  - Advanced Liver disease    Therapy Plans: ProPT [place] Disposition: Home with husband Planned DVT Prophylaxis: Eliquis  DME Needed: None PCP: Clemmie Nest, MD (clearance received) Cardiologist: Kardie Tobb, DO (clearance pending, appt 9/2)* Electrophysiologist: Will Gladis Norton, MD (clearance pending, appt 9/12)* TXA: IV Allergies: NKDA Anesthesia Concerns: None BMI: 34.1 Last HgbA1c: Not diabetic  [facility] [address], [place]  Other: -EP follow up on 9/12. Must be on uninterrupted OAC for 90 days after ablation, performed on 11/17/23. Follow up Eliquis  instructions* -Tibial nerve implant in place. Pt states that per urology, she is to deactivate prior to surgery   - Patient was instructed on what medications to stop prior to surgery. - Follow-up visit in 2 weeks with Dr. Melodi - Begin physical therapy following surgery - Pre-operative lab work as pre-surgical testing - Prescriptions will be provided in  hospital at time of discharge  Corean Sender, PA-C Orthopedic Surgery [facility] [place]

## 2024-02-01 DIAGNOSIS — Z85828 Personal history of other malignant neoplasm of skin: Secondary | ICD-10-CM | POA: Diagnosis not present

## 2024-02-01 DIAGNOSIS — L814 Other melanin hyperpigmentation: Secondary | ICD-10-CM | POA: Diagnosis not present

## 2024-02-01 DIAGNOSIS — D229 Melanocytic nevi, unspecified: Secondary | ICD-10-CM | POA: Diagnosis not present

## 2024-02-01 DIAGNOSIS — Z87898 Personal history of other specified conditions: Secondary | ICD-10-CM | POA: Diagnosis not present

## 2024-02-01 DIAGNOSIS — D485 Neoplasm of uncertain behavior of skin: Secondary | ICD-10-CM | POA: Diagnosis not present

## 2024-02-01 DIAGNOSIS — L821 Other seborrheic keratosis: Secondary | ICD-10-CM | POA: Diagnosis not present

## 2024-02-01 DIAGNOSIS — L570 Actinic keratosis: Secondary | ICD-10-CM | POA: Diagnosis not present

## 2024-02-01 DIAGNOSIS — Z85820 Personal history of malignant melanoma of skin: Secondary | ICD-10-CM | POA: Diagnosis not present

## 2024-02-02 ENCOUNTER — Encounter: Payer: Self-pay | Admitting: *Deleted

## 2024-02-06 ENCOUNTER — Ambulatory Visit: Attending: Emergency Medicine | Admitting: Emergency Medicine

## 2024-02-06 ENCOUNTER — Encounter: Payer: Self-pay | Admitting: Emergency Medicine

## 2024-02-06 VITALS — BP 138/66 | HR 62 | Ht 67.0 in | Wt 209.0 lb

## 2024-02-06 DIAGNOSIS — Z01810 Encounter for preprocedural cardiovascular examination: Secondary | ICD-10-CM | POA: Diagnosis not present

## 2024-02-06 DIAGNOSIS — E785 Hyperlipidemia, unspecified: Secondary | ICD-10-CM | POA: Insufficient documentation

## 2024-02-06 DIAGNOSIS — I2510 Atherosclerotic heart disease of native coronary artery without angina pectoris: Secondary | ICD-10-CM | POA: Insufficient documentation

## 2024-02-06 DIAGNOSIS — I422 Other hypertrophic cardiomyopathy: Secondary | ICD-10-CM | POA: Insufficient documentation

## 2024-02-06 DIAGNOSIS — I480 Paroxysmal atrial fibrillation: Secondary | ICD-10-CM | POA: Diagnosis not present

## 2024-02-06 DIAGNOSIS — I483 Typical atrial flutter: Secondary | ICD-10-CM | POA: Insufficient documentation

## 2024-02-06 DIAGNOSIS — I10 Essential (primary) hypertension: Secondary | ICD-10-CM | POA: Insufficient documentation

## 2024-02-06 NOTE — Patient Instructions (Signed)
 Medication Instructions:  NO CHANGES   Lab Work: NONE TO BE DONE TODAY.   Testing/Procedures: NONE  Follow-Up: At [facility], you and your health needs are our priority.  As part of our continuing mission to provide you with exceptional heart care, our providers are all part of one team.  This team includes your primary Cardiologist (physician) and Advanced Practice Providers or APPs (Physician Assistants and Nurse Practitioners) who all work together to provide you with the care you need, when you need it.  Your next appointment:   6 month(s)  Provider:   Kardie Tobb, DO OR Lum Louis, NP

## 2024-02-06 NOTE — Progress Notes (Signed)
 Cardiology Office Note:    Date:  02/06/2024  ID:  Monica Anderson, DOB 1946/04/15, MRN [id number] PCP: Clemmie Nest, MD  [facility] Providers Cardiologist:  Dub Huntsman, DO Electrophysiologist:  Will Gladis Norton, MD       Patient Profile:       Chief Complaint: Preoperative cardiovascular clearance History of Present Illness:  Monica Anderson is a 78 y.o. female with visit-pertinent history of coronary artery disease, apical variant hypertrophic cardiomyopathy, paroxysmal atrial fibrillation, hypertension, hyperlipidemia, Mnire's disease, BPPV  She was initially diagnosed with atrial fibrillation in 2015.  She has history of apical variant hypertrophic cardiomyopathy.  She was evaluated by EP on 03/2020 and was recommended to hold off on pursuing ICD.  Coronary CT in 2021 showed minimal nonobstructive CAD.  She was hospitalized in 03/2022 in the setting of A-fib with RVR.  Echocardiogram at that time was unremarkable.  She had presented to [facility] emergency department in January 2025 for evaluation of chest pain.  Troponins were negative.  She underwent nuclear stress test which showed mild defect involving the basal portion of the inferior wall and apical midportion of the anterior wall.  She was transferred to [facility] and underwent cardiac catheterization on 07/10/2023 which revealed 50 to 60% stenosis of the mid LAD, IFR 0.93, minimal plaque in the proximal to mid RCA.  Medical therapy was recommended.  Crestor  was subsequently increased to 20 mg daily.  She was noted to be in atrial fibrillation upon arrival to the hospital, however she spontaneously converted to sinus rhythm.  ZIO 07/2023 showed no evidence of atrial fibrillation.  She underwent atrial fibrillation and flutter ablation with Dr. Norton on 11/17/2023.  She was last seen in office on 12/18/2023 by A-fib clinic and she was maintaining NSR at the time.  Her amiodarone  was stopped given her nausea associated with  the medicine.  She was to follow back up with A-fib clinic in 2 months.   Discussed the use of AI scribe software for clinical note transcription with the patient, who gave verbal consent to proceed.  History of Present Illness Monica Anderson is a 78 year old female with atrial fibrillation and coronary artery disease who presents for pre-operative clearance for knee surgery.  Today patient is without acute cardiovascular concerns or complaints.  She is scheduled for knee surgery due to pain that limits her walking. She rides a stationary bike and gardens without experiencing chest pains. She has been without any symptoms concerning for recurrent atrial fibrillation.  She has no nausea since stopping amiodarone . She has been without any syncope or presyncope.  Her cholesterol is managed with rosuvastatin , last recorded at 69 mg/dL, meeting her target. She is on Eliquis  for anticoagulation.  She reports no issues with these medications and has not missed any doses.  Review of systems:  Please see the history of present illness. All other systems are reviewed and otherwise negative.      Studies Reviewed:    EKG Interpretation Date/Time:  Tuesday February 06 2024 08:27:27 EDT Ventricular Rate:  62 PR Interval:  120 QRS Duration:  94 QT Interval:  442 QTC Calculation: 448 R Axis:   16  Text Interpretation: Normal sinus rhythm Left ventricular hypertrophy with repolarization abnormality ( R in aVL , Sokolow-Lyon , Cornell product , Romhilt-Estes ) When compared with ECG of 18-Dec-2023 13:32, No significant change was found Confirmed by Rana Dixon [phone]) on 02/06/2024 12:58:57 PM    ZIO 08/2023 Patch Wear Time:  6  days and 23 hours (2025-02-25T01:08:20-0500 to 2025-03-04T00:26:59-0500)   Patient had a min HR of 47 bpm, max HR of 152 bpm, and avg HR of 63 bpm.    Predominant underlying rhythm was Sinus Rhythm.  2 Ventricular Tachycardia runs occurred, the run with the fastest  interval lasting 7 beats with a max rate of 152 bpm (avg 117 bpm); the run with the fastest interval was also the longest.    2 Supraventricular Tachycardia runs occurred, the run with the fastest interval lasting 7 beats with a max rate of 136 bpm (avg 126 bpm); the run with the fastest interval was also the longest.  Isolated SVEs were rare (<1.0%), SVE Couplets were rare (<1.0%), and SVE Triplets were rare (<1.0%). Isolated VEs were rare (<1.0%), VE Couplets were rare (<1.0%), and no VE Triplets were present. Ventricular Bigeminy was present. Inverted QRS complexes possibly due to inverted placement of device.   Symptoms associated with sinus rhythm.    Conclusion: The study shows the following:                       1. Nonsustained ventricular tachycardia                        2. Paroxysmal Supraventricular tachycardia  Cardiac catheterization 07/10/2023 Conclusions: Moderate, non-obstructive coronary artery disease with focal and eccentric 50-60% stenosis in the mid LAD that improves with intracoronary nitroglycerin  and is not hemodynamically significant by RFR (0.93).  Minimal plaquing also noted in the proximal/mid RCA. Normal left ventricular filling pressure (LVEDP 14 mmHg). Diagnostic Dominance: Right  Risk Assessment/Calculations:    CHA2DS2-VASc Score = 4   This indicates a 4.8% annual risk of stroke. The patient's score is based upon: CHF History: 0 HTN History: 1 Diabetes History: 0 Stroke History: 0 Vascular Disease History: 0 Age Score: 2 Gender Score: 1             Physical Exam:   VS:  BP 138/66 (BP Location: Left Arm, Patient Position: Sitting, Cuff Size: Normal)   Pulse 62   Ht 5' 7 (1.702 m)   Wt 209 lb (94.8 kg)   LMP  (LMP Unknown)   BMI 32.73 kg/m    Wt Readings from Last 3 Encounters:  02/06/24 209 lb (94.8 kg)  12/18/23 209 lb 9.6 oz (95.1 kg)  11/17/23 208 lb (94.3 kg)    GEN: Well nourished, well developed in no acute distress NECK: No JVD;  No carotid bruits CARDIAC: RRR, no murmurs, rubs, gallops RESPIRATORY:  Clear to auscultation without rales, wheezing or rhonchi  ABDOMEN: Soft, non-tender, non-distended EXTREMITIES:  No edema; No acute deformity      Assessment and Plan:  Coronary artery disease Coronary CTA in 03/2020 showed minimal nonobstructive CAD Cardiac catheterization 07/2023 revealed 50 to 60% stenosis in the mid LAD, RFR 0.93, minimal plaque in the proximal to mid RCA.  Medical therapy was recommended - Today patient is without anginal symptoms.  She rides her stationary bike and gardens without exertional symptoms or limitation.  She is without any chest pains or dyspnea - No ASA in the setting of chronic DOAC therapy - Continue carvedilol  6.25 mg twice daily and rosuvastatin  20 mg daily  Apical variant hypertrophic cardiomyopathy Noted on prior echo and confirmed on cardiac MRI on 11/2019 - Today patient appears euvolemic and well compensated on exam.  She is without any syncope, presyncope, lightheadedness, or dizziness. - She stays well-hydrated  and overall asymptomatic/stable - Continue current medication therapy  Paroxysmal atrial fibrillation/atrial flutter S/p A-fib and flutter ablation on 11/17/2023 - EKG today shows patient is maintaining sinus rhythm - Amiodarone  discontinued in the past due to nausea - Continue carvedilol  6.25 mg twice daily, diltiazem  240 mg daily - Continue Eliquis  5 mg twice daily with no missed doses for 3 months post ablation - Has been managed by Afib clinic   Hypertension Blood pressure is well-controlled at 138/66 - Continue carvedilol  6.25 mg twice daily, diltiazem  240 mg daily, spironolactone -hydrochlorothiazide  12.5-12.5 mg daily  Hyperlipidemia, LDL goal <70 LDL 69 on 07/2023 and well-controlled - Continue rosuvastatin  20 mg daily  Preoperative cardiovascular clearance According to the Revised Cardiac Risk Index (RCRI), her Perioperative Risk of Major Cardiac  Event is (%): 0.4. Her Functional Capacity in METs is: 5.62 according to the Duke Activity Status Index (DASI). Therefore, based on ACC/AHA guidelines, patient would be at acceptable risk for the planned procedure without further cardiovascular testing. I will route this recommendation to the requesting party via Epic fax function.  Patient has had an Afib/aflutter ablation within the last 3 months.  She underwent A-fib/flutter ablation on 11/17/2023.  She must continue Eliquis  5 mg twice daily with no missed doses for 3 months post ablation.  90 days past ablation is 02/15/24   Per office protocol, patient can hold Eliquis  for 3 days prior to procedure.       Dispo:  Return in about 6 months (around 08/05/2024).  Signed, Lum LITTIE Louis, NP

## 2024-02-06 NOTE — Telephone Encounter (Signed)
 Patient with diagnosis of A Fib on Eliquis  for anticoagulation. A Fib ablation on 11/17/23  Procedure: Left Total Knee Arthroplasty  Date of procedure: 02/19/24   CHA2DS2-VASc Score = 5   This indicates a 7.2% annual risk of stroke. The patient's score is based upon: CHF History: 0 HTN History: 1 Diabetes History: 0 Stroke History: 0 Vascular Disease History: 1 Age Score: 2 Gender Score: 1    CrCl 65 ml/min Platelet count 199K  Patient has/has not had an Afib/aflutter ablation within the last 3 months or DCCV within the last 30 days. 90 days past ablation is 02/15/24  Per office protocol, patient can hold Eliquis  for 3 days prior to procedure.    **This guidance is not considered finalized until pre-operative APP has relayed final recommendations.**

## 2024-02-07 NOTE — Patient Instructions (Signed)
 SURGICAL WAITING ROOM VISITATION Patients having surgery or a procedure may have no more than 2 support people in the waiting area - these visitors may rotate in the visitor waiting room.   If the patient needs to stay at the hospital during part of their recovery, the visitor guidelines for inpatient rooms apply.  PRE-OP VISITATION  Pre-op nurse will coordinate an appropriate time for 1 support person to accompany the patient in pre-op.  This support person may not rotate.  This visitor will be contacted when the time is appropriate for the visitor to come back in the pre-op area.  Please refer to the [facility] website for the visitor guidelines for Inpatients (after your surgery is over and you are in a regular room).  You are not required to quarantine at this time prior to your surgery. However, you must do this: Hand Hygiene often Do NOT share personal items Notify your provider if you are in close contact with someone who has COVID or you develop fever 100.4 or greater, new onset of sneezing, cough, sore throat, shortness of breath or body aches.  If you test positive for Covid or have been in contact with anyone that has tested positive in the last 10 days please notify you surgeon.    Your procedure is scheduled on:  Monday February 19, 2024  Report to [facility] Main Entrance: [address] entrance where the [employer] is available.   Report to admitting at: 05:45    AM  Call this number if you have any questions or problems the morning of surgery [phone]  Do not eat food after Midnight the night prior to your surgery/procedure.  After Midnight you may have the following liquids until  05:15 AM DAY OF SURGERY  Clear Liquid Diet Water Black Coffee (sugar ok, NO MILK/CREAM OR CREAMERS)  Tea (sugar ok, NO MILK/CREAM OR CREAMERS) regular and decaf                             Plain Jell-O  with no fruit (NO RED)                                           Fruit  ices (not with fruit pulp, NO RED)                                     Popsicles (NO RED)                                                                  Juice: NO CITRUS JUICES: only apple, WHITE grape, WHITE cranberry Sports drinks like Gatorade or Powerade (NO RED)                   The day of surgery:  Drink ONE (1) Pre-Surgery G2 at 05:45  AM the morning of surgery. Drink in one sitting. Do not sip.  This drink was given to you during your hospital pre-op appointment visit. Nothing else to drink after completing the Pre-Surgery  G2 : No candy, chewing gum or throat lozenges.    FOLLOW ANY ADDITIONAL PRE OP INSTRUCTIONS YOU RECEIVED FROM YOUR SURGEON'S OFFICE!!!   Oral Hygiene is also important to reduce your risk of infection.        Remember - BRUSH YOUR TEETH THE MORNING OF SURGERY WITH YOUR REGULAR TOOTHPASTE  Do NOT smoke after Midnight the night before surgery.  STOP TAKING all Vitamins, Herbs and supplements 1 week before your surgery.   FARXIGA - Stop taking 72 hours prior to your surgery. Last dose will be taken on Thursday 02-15-2024  ELIQUIS - Stop taking 72 hours prior to your surgery. Last dose will be taken on Thursday 02-15-2024  Take ONLY these medicines the morning of surgery with A SIP OF WATER:   Solifenacin (Vesicare), Diltiazem , Carvedilol , Bupropion  and Macrobid (if still taking).  DO NOT TAKE Spironolactone  / HCTZ on the morning of your surgery.                  You may not have any metal on your body including hair pins, jewelry, and body piercing  Do not wear make-up, lotions, powders, perfumes or deodorant  Do not wear nail polish including gel and S&S, artificial / acrylic nails, or any other type of covering on natural nails including finger and toenails. If you have artificial nails, gel coating, etc., that needs to be removed by a nail salon, Please have this removed prior to surgery. Not doing so may mean that your surgery could be cancelled or  delayed if the Surgeon or anesthesia staff feels like they are unable to monitor you safely.   Do not shave 48 hours prior to surgery to avoid nicks in your skin which may contribute to postoperative infections.   Contacts, Hearing Aids, dentures or bridgework may not be worn into surgery. DENTURES WILL BE REMOVED PRIOR TO SURGERY PLEASE DO NOT APPLY Poly grip OR ADHESIVES!!!  You may bring a small overnight bag with you on the day of surgery, only pack items that are not valuable. [facility] IS NOT RESPONSIBLE   FOR VALUABLES THAT ARE LOST OR STOLEN.   Do not bring your home medications to the hospital. The Pharmacy will dispense medications listed on your medication list to you during your admission in the Hospital.  Please read over the following fact sheets you were given: IF YOU HAVE QUESTIONS ABOUT YOUR PRE-OP INSTRUCTIONS, PLEASE CALL [phone].     Pre-operative 5 CHG Bath Instructions   You can play a key role in reducing the risk of infection after surgery. Your skin needs to be as free of germs as possible. You can reduce the number of germs on your skin by washing with CHG (chlorhexidine  gluconate) soap before surgery. CHG is an antiseptic soap that kills germs and continues to kill germs even after washing.   DO NOT use if you have an allergy to chlorhexidine /CHG or antibacterial soaps. If your skin becomes reddened or irritated, stop using the CHG and notify one of our RNs at [phone]  Please shower with the CHG soap starting 4 days before surgery using the following schedule: START SHOWERS ON   THURSDAY  February 15, 2024  Please keep in mind the following:  DO NOT shave, including legs and underarms, starting the day of your first shower.   You may shave your face at any point before/day of  surgery.   Place clean sheets on your bed the day you start using CHG soap. Use a clean washcloth (not used since being washed) for each shower. DO NOT sleep with pets once you start using the CHG.   CHG Shower Instructions:  If you choose to wash your hair and private area, wash first with your normal shampoo/soap.  After you use shampoo/soap, rinse your hair and body thoroughly to remove shampoo/soap residue.  Turn the water OFF and apply about 3 tablespoons (45 ml) of CHG soap to a CLEAN washcloth.  Apply CHG soap ONLY FROM YOUR NECK DOWN TO YOUR TOES (washing for 3-5 minutes)  DO NOT use CHG soap on face, private areas, open wounds, or sores.  Pay special attention to the area where your surgery is being performed.  If you are having back surgery, having someone wash your back for you may be helpful.  Wait 2 minutes after CHG soap is applied, then you may rinse off the CHG soap.  Pat dry with a clean towel  Put on clean clothes/pajamas   If you choose to wear lotion, please use ONLY the CHG-compatible lotions on the back of this paper.     Additional instructions for the day of surgery: DO NOT APPLY any lotions, deodorants, cologne, or perfumes.   Put on clean/comfortable clothes.  Brush your teeth.  Ask your nurse before applying any prescription medications to the skin.      CHG Compatible Lotions   Aveeno Moisturizing lotion  Cetaphil Moisturizing Cream  Cetaphil Moisturizing Lotion  Clairol Herbal Essence Moisturizing Lotion, Dry Skin  Clairol Herbal Essence Moisturizing Lotion, Extra Dry Skin  Clairol Herbal Essence Moisturizing Lotion, Normal Skin  Curel Age Defying Therapeutic Moisturizing Lotion with Alpha Hydroxy  Curel Extreme Care Body Lotion  Curel Soothing Hands Moisturizing Hand Lotion  Curel Therapeutic Moisturizing Cream, Fragrance-Free  Curel Therapeutic Moisturizing Lotion, Fragrance-Free  Curel Therapeutic Moisturizing Lotion, Original Formula   Eucerin Daily Replenishing Lotion  Eucerin Dry Skin Therapy Plus Alpha Hydroxy Crme  Eucerin Dry Skin Therapy Plus Alpha Hydroxy Lotion  Eucerin Original Crme  Eucerin Original Lotion  Eucerin Plus Crme Eucerin Plus Lotion  Eucerin TriLipid Replenishing Lotion  Keri Anti-Bacterial Hand Lotion  Keri Deep Conditioning Original Lotion Dry Skin Formula Softly Scented  Keri Deep Conditioning Original Lotion, Fragrance Free Sensitive Skin Formula  Keri Lotion Fast Absorbing Fragrance Free Sensitive Skin Formula  Keri Lotion Fast Absorbing Softly Scented Dry Skin Formula  Keri Original Lotion  Keri Skin Renewal Lotion Keri Silky Smooth Lotion  Keri Silky Smooth Sensitive Skin Lotion  Nivea Body Creamy Conditioning Oil  Nivea Body Extra Enriched Lotion  Nivea Body Original Lotion  Nivea Body Sheer Moisturizing Lotion Nivea Crme  Nivea Skin Firming Lotion  NutraDerm 30 Skin Lotion  NutraDerm Skin Lotion  NutraDerm Therapeutic Skin Cream  NutraDerm Therapeutic Skin Lotion  ProShield Protective Hand Cream  Provon moisturizing lotion   FAILURE TO FOLLOW THESE INSTRUCTIONS MAY RESULT IN THE CANCELLATION OF YOUR SURGERY  PATIENT SIGNATURE_________________________________  NURSE SIGNATURE__________________________________  ________________________________________________________________________        Monica Anderson    An incentive spirometer is a tool that can help keep your lungs clear and active. This tool measures how well you are filling your lungs with each breath.  Taking long deep breaths may help reverse or decrease the chance of developing breathing (pulmonary) problems (especially infection) following: A long period of time when you are unable to move or be active. BEFORE THE PROCEDURE  If the spirometer includes an indicator to show your best effort, your nurse or respiratory therapist will set it to a desired goal. If possible, sit up straight or lean slightly  forward. Try not to slouch. Hold the incentive spirometer in an upright position. INSTRUCTIONS FOR USE  Sit on the edge of your bed if possible, or sit up as far as you can in bed or on a chair. Hold the incentive spirometer in an upright position. Breathe out normally. Place the mouthpiece in your mouth and seal your lips tightly around it. Breathe in slowly and as deeply as possible, raising the piston or the ball toward the top of the column. Hold your breath for 3-5 seconds or for as long as possible. Allow the piston or ball to fall to the bottom of the column. Remove the mouthpiece from your mouth and breathe out normally. Rest for a few seconds and repeat Steps 1 through 7 at least 10 times every 1-2 hours when you are awake. Take your time and take a few normal breaths between deep breaths. The spirometer may include an indicator to show your best effort. Use the indicator as a goal to work toward during each repetition. After each set of 10 deep breaths, practice coughing to be sure your lungs are clear. If you have an incision (the cut made at the time of surgery), support your incision when coughing by placing a pillow or rolled up towels firmly against it. Once you are able to get out of bed, walk around indoors and cough well. You may stop using the incentive spirometer when instructed by your caregiver.  RISKS AND COMPLICATIONS Take your time so you do not get dizzy or light-headed. If you are in pain, you may need to take or ask for pain medication before doing incentive spirometry. It is harder to take a deep breath if you are having pain. AFTER USE Rest and breathe slowly and easily. It can be helpful to keep track of a log of your progress. Your caregiver can provide you with a simple table to help with this. If you are using the spirometer at home, follow these instructions: SEEK MEDICAL CARE IF:  You are having difficultly using the spirometer. You have trouble using the  spirometer as often as instructed. Your pain medication is not giving enough relief while using the spirometer. You develop fever of 100.5 F (38.1 C) or higher.                                                                                                    SEEK IMMEDIATE MEDICAL CARE IF:  You cough up bloody sputum that had not been present before. You develop fever of 102 F (38.9 C) or greater. You develop worsening pain at or near the incision site. MAKE SURE YOU:  Understand these instructions. Will watch your  condition. Will get help right away if you are not doing well or get worse. Document Released: 10/03/2006 Document Revised: 08/15/2011 Document Reviewed: 12/04/2006 [facility] Patient Information 2014 [place], [place].      If you would like to see a video about joint replacement:   [email or web address]

## 2024-02-07 NOTE — Progress Notes (Signed)
 COVID Vaccine received:  []  No [x]  Yes Date of any COVID positive Test in last 90 days:  none  PCP - Delon Contes, MD  Cardiologist - Kardie Tobb, DO Lum Louis NP, cardiac clearance in 02-06-24 note EP- Soyla Norton, MD  Urology- Glendia McDiarmid, MD   Chest x-ray -  EKG - 02-06-2024  Epic  Stress Test -  ECHO - 07-08-23 Epic Cardiac Cath - 07-10-23  LHC / CORS by Dr. Mady CT Coronary Calcium  score: 81.3  on 10-27-23 Epic Zio Monitor- 08-17-23  Epic  Pacemaker / ICD device [x]  No []  Yes   Spinal Cord Stimulator:[x]  No []  Yes    Tibial Nerve Stimulator (for incontinence) will bring remote      History of Sleep Apnea? [x]  No []  Yes   CPAP used?- [x]  No []  Yes    Patient has: []  NO Hx DM   [x]  Pre-DM   []  DM1  []   DM2 Does the patient monitor blood sugar?   []  N/A   [x]  No []  Yes  Last A1c was: 6.2 on  07-31-23 per PCP clearance      FARXIGA - Hold x 72 hours.   Blood Thinner / Instructions: Eliquis   Hold x 72 hrs,last day Thursday 02-15-24   had A.Fib Ablation- 11-17-23-  Cardiology ok'd hold Aspirin  Instructions: none  ERAS Protocol Ordered: []  No  [x]  Yes PRE-SURGERY []  ENSURE  [x]  G2    Patient is to be NPO after: 0515  Dental hx: []  Dentures:  [x]  N/A      []  Bridge or Partial:                   []  Loose or Damaged teeth:   Activity level: Able to walk up 2 flights of stairs without becoming significantly short of breath or having chest pain?  [x]  No  SOB and leg pain  []    Yes  Patient can perform ADLs without assistance. []  No   [x]   Yes  Anesthesia review: CAD- Hypertrophic CM, NSVT, A.fib- Ablation 11-17-23, Meniere's (hearing loss in Left ear), HTN, Depression,  CKD3  Patient denies any S&S of respiratory illness or Covid - no shortness of breath, fever, cough or chest pain at PAT appointment.  Patient verbalized understanding and agreement to the Pre-Surgical Instructions that were given to them at this PAT appointment. Patient was also educated of the need to review  these PAT instructions again prior to her surgery.I reviewed the appropriate phone numbers to call if they have any and questions or concerns.

## 2024-02-08 ENCOUNTER — Encounter (HOSPITAL_COMMUNITY)
Admission: RE | Admit: 2024-02-08 | Discharge: 2024-02-08 | Disposition: A | Discharge status: Home / Self Care | Source: Ambulatory Visit | Attending: Orthopedic Surgery | Admitting: Orthopedic Surgery

## 2024-02-08 ENCOUNTER — Other Ambulatory Visit: Payer: Self-pay

## 2024-02-08 ENCOUNTER — Encounter (HOSPITAL_COMMUNITY): Payer: Self-pay

## 2024-02-08 VITALS — BP 136/64 | HR 99 | Temp 98.6°F | Resp 20 | Ht 66.5 in | Wt 205.0 lb

## 2024-02-08 DIAGNOSIS — Z01812 Encounter for preprocedural laboratory examination: Secondary | ICD-10-CM | POA: Insufficient documentation

## 2024-02-08 DIAGNOSIS — I130 Hypertensive heart and chronic kidney disease with heart failure and stage 1 through stage 4 chronic kidney disease, or unspecified chronic kidney disease: Secondary | ICD-10-CM | POA: Diagnosis not present

## 2024-02-08 DIAGNOSIS — I422 Other hypertrophic cardiomyopathy: Secondary | ICD-10-CM | POA: Diagnosis not present

## 2024-02-08 DIAGNOSIS — I4891 Unspecified atrial fibrillation: Secondary | ICD-10-CM | POA: Insufficient documentation

## 2024-02-08 DIAGNOSIS — M1712 Unilateral primary osteoarthritis, left knee: Secondary | ICD-10-CM | POA: Insufficient documentation

## 2024-02-08 DIAGNOSIS — I2510 Atherosclerotic heart disease of native coronary artery without angina pectoris: Secondary | ICD-10-CM | POA: Diagnosis not present

## 2024-02-08 DIAGNOSIS — N182 Chronic kidney disease, stage 2 (mild): Secondary | ICD-10-CM | POA: Diagnosis not present

## 2024-02-08 DIAGNOSIS — Z01818 Encounter for other preprocedural examination: Secondary | ICD-10-CM

## 2024-02-08 DIAGNOSIS — R350 Frequency of micturition: Secondary | ICD-10-CM | POA: Diagnosis not present

## 2024-02-08 DIAGNOSIS — I509 Heart failure, unspecified: Secondary | ICD-10-CM | POA: Diagnosis not present

## 2024-02-08 DIAGNOSIS — I4729 Other ventricular tachycardia: Secondary | ICD-10-CM | POA: Insufficient documentation

## 2024-02-08 DIAGNOSIS — R7303 Prediabetes: Secondary | ICD-10-CM | POA: Insufficient documentation

## 2024-02-08 DIAGNOSIS — Z7901 Long term (current) use of anticoagulants: Secondary | ICD-10-CM | POA: Insufficient documentation

## 2024-02-08 DIAGNOSIS — N3946 Mixed incontinence: Secondary | ICD-10-CM | POA: Diagnosis not present

## 2024-02-08 HISTORY — DX: Cardiac arrhythmia, unspecified: I49.9

## 2024-02-08 HISTORY — DX: Heart failure, unspecified: I50.9

## 2024-02-08 HISTORY — DX: Squamous cell carcinoma of skin of scalp and neck: C44.42

## 2024-02-08 HISTORY — DX: Acute myocardial infarction, unspecified: I21.9

## 2024-02-08 HISTORY — DX: Pneumonia, unspecified organism: J18.9

## 2024-02-08 HISTORY — DX: Chronic kidney disease, unspecified: N18.9

## 2024-02-08 HISTORY — DX: Prediabetes: R73.03

## 2024-02-08 LAB — BASIC METABOLIC PANEL WITH GFR
Anion gap: 12 (ref 5–15)
BUN: 29 mg/dL — ABNORMAL HIGH (ref 8–23)
CO2: 25 mmol/L (ref 22–32)
Calcium: 9.8 mg/dL (ref 8.9–10.3)
Chloride: 103 mmol/L (ref 98–111)
Creatinine, Ser: 1.38 mg/dL — ABNORMAL HIGH (ref 0.44–1.00)
GFR, Estimated: 39 mL/min — ABNORMAL LOW (ref >60)
Glucose, Bld: 96 mg/dL (ref 70–99)
Potassium: 4.4 mmol/L (ref 3.5–5.1)
Sodium: 140 mmol/L (ref 135–145)

## 2024-02-08 LAB — CBC
HCT: 47.9 % — ABNORMAL HIGH (ref 36.0–46.0)
Hemoglobin: 15.1 g/dL — ABNORMAL HIGH (ref 12.0–15.0)
MCH: 30.7 pg (ref 26.0–34.0)
MCHC: 31.5 g/dL (ref 30.0–36.0)
MCV: 97.4 fL (ref 80.0–100.0)
Platelets: 187 K/uL (ref 150–400)
RBC: 4.92 MIL/uL (ref 3.87–5.11)
RDW: 14.5 % (ref 11.5–15.5)
WBC: 6 K/uL (ref 4.0–10.5)
nRBC: 0 % (ref 0.0–0.2)

## 2024-02-08 LAB — SURGICAL PCR SCREEN
MRSA, PCR: NEGATIVE
Staphylococcus aureus: NEGATIVE

## 2024-02-08 MED ORDER — ROSUVASTATIN CALCIUM 20 MG PO TABS: 20.0000 mg | ORAL_TABLET | Freq: Every evening | ORAL | 3 refills | Status: AC

## 2024-02-08 NOTE — Telephone Encounter (Signed)
 Refill sent to pharmacy.

## 2024-02-09 NOTE — Anesthesia Preprocedure Evaluation (Addendum)
 Anesthesia Evaluation  Patient identified by MRN, date of birth, ID band Patient awake    Reviewed: Allergy & Precautions, NPO status , Patient's Chart, lab work & pertinent test results  History of Anesthesia Complications Negative for: history of anesthetic complications  Airway Mallampati: II  TM Distance: >3 FB Neck ROM: Full    Dental  (+) Dental Advisory Given, Teeth Intact, Missing   Pulmonary pneumonia   Pulmonary exam normal breath sounds clear to auscultation       Cardiovascular hypertension, Pt. on medications and Pt. on home beta blockers (-) angina + CAD and +CHF  (-) Past MI and (-) Cardiac Stents + dysrhythmias Atrial Fibrillation + Valvular Problems/Murmurs MR  Rhythm:Regular Rate:Normal + Systolic murmurs Cath 07/2023 Conclusions: 1. Moderate, non-obstructive coronary artery disease with focal and eccentric 50-60% stenosis in the mid LAD that improves with intracoronary nitroglycerin  and is not hemodynamically significant by RFR (0.93).  Minimal plaquing also noted in the proximal/mid RCA. 2. Normal left ventricular filling pressure (LVEDP 14 mmHg).   Recommendations: 1. Continue medical therapy and risk factor modification to prevent progression of coronary artery disease.  Consider addition of standing calcium  channel blocker for treatment of possible vasospasm. 2. Monitor on telemetry overnight to exclude arrhythmia, as the patient has a history of PAF, NSVT, and PSVT that could have contributed to her presenting chest pain. 3. Restart heparin  infusion 2 hours after TR band has been removed.  Transition back to apixaban  tomorrow morning if there is no evidence of bleeding or vascular injury at the catheterization site.      HLD  TTE 03/21/2022: There is severe concentric left ventricular hypertrophy with septal and  posterior wall thickness ~ 1.5 cm.  Apical hypertrophy appears more predominant on contrast  study.  No evidence of LVOT obstruction.  LVEF 65-70%  Mild tricuspid regurgitation   CT Coronary 03/09/2020: IMPRESSION: 1. Coronary calcium  score of 3. This was 31st percentile for age and sex matched control.   2. Normal coronary origin with right dominance.   3. Nonobstructive CAD with calcified plaque in the mid LAD and mid D1 causing minimal (0-24%) stenosis     Neuro/Psych neg Seizures Meniere's disease    GI/Hepatic Neg liver ROS,GERD  Medicated,,  Endo/Other  negative endocrine ROS    Renal/GU Renal disease     Musculoskeletal  (+) Arthritis , Osteoarthritis,    Abdominal  (+) + obese  Peds  Hematology  (+) Blood dyscrasia, anemia   Anesthesia Other Findings   Reproductive/Obstetrics                              Anesthesia Physical Anesthesia Plan  ASA: 3  Anesthesia Plan: General   Post-op Pain Management: Tylenol  PO (pre-op)* and Regional block*   Induction: Intravenous  PONV Risk Score and Plan: 4 or greater and Ondansetron , Dexamethasone , Treatment may vary due to age or medical condition, Propofol  infusion and TIVA  Airway Management Planned: Oral ETT  Additional Equipment: ClearSight  Intra-op Plan:   Post-operative Plan: Extubation in OR  Informed Consent: I have reviewed the patients History and Physical, chart, labs and discussed the procedure including the risks, benefits and alternatives for the proposed anesthesia with the patient or authorized representative who has indicated his/her understanding and acceptance.     Dental advisory given  Plan Discussed with: CRNA  Anesthesia Plan Comments: (See PAT note 02/08/2024  Risks of anesthesia explained at length. This  includes, but is not limited to, sore throat, damage to teeth, lips gums, tongue and vocal cords, nausea and vomiting, reactions to medications, stroke, heart attack, and death. All patient questions were answered and the patient wishes to  proceed. Risks of peripheral nerve block explained at length. This includes, but is not limited to, bleeding, infection, reactions to the medications, seizures, damage to surrounding structures, damage to nerves, permanent weakness, numbness, tingling and pain. All patient questions were answered and patient wishes to proceed with nerve block. )        Anesthesia Quick Evaluation

## 2024-02-09 NOTE — Progress Notes (Signed)
 Anesthesia Chart Review   Case: [id number] Date/Time: 02/19/24 0805   Procedure: ARTHROPLASTY, KNEE, TOTAL (Left: Knee)   Anesthesia type: Choice   Pre-op diagnosis: Left knee osteoarthritis   Location: [facility] ROOM 10 / [facility]   Surgeons: Melodi Lerner, MD       DISCUSSION:78 y.o. never smoker with h/o HTN, atrial fibrillation s/p ablation 11/17/23, apical variant hypertrophic cardiomyopathy, CHF, nonobstructive CAD on Coronary CTA 2021, CKD Stage II, left knee OA scheduled for above procedure 02/19/2024 with Dr. Lerner Melodi.   Pt last seen by cardiology 02/06/24. Pt euvolemic at this visit. Cardiac Cath 07/2023 with 50 to 60% stenosis in the mid LAD, RFR 0.93, minimal plaque in the proximal to mid RCA.  Medical therapy was recommended. Per OV note, According to the Revised Cardiac Risk Index (RCRI), her Perioperative Risk of Major Cardiac Event is (%): 0.4. Her Functional Capacity in METs is: 5.62 according to the Duke Activity Status Index (DASI). Therefore, based on ACC/AHA guidelines, patient would be at acceptable risk for the planned procedure without further cardiovascular testing. I will route this recommendation to the requesting party via Epic fax function.   Patient has had an Afib/aflutter ablation within the last 3 months.  She underwent A-fib/flutter ablation on 11/17/2023.  She must continue Eliquis  5 mg twice daily with no missed doses for 3 months post ablation.  90 days past ablation is 02/15/24   Per office protocol, patient can hold Eliquis  for 3 days prior to procedure.  Pt reports last dose of Eliquis  02/15/2024.  VS: BP 136/64 Comment: right arm sitting  Pulse 99   Temp 37 C (Oral)   Resp 20   Ht 5' 6.5 (1.689 m)   Wt 93 kg   LMP  (LMP Unknown)   SpO2 96%   BMI 32.59 kg/m   PROVIDERS: Clemmie Nest, MD is PCP   Cardiologist - Dub Huntsman, DO  LABS: Labs reviewed: Acceptable for surgery. (all labs ordered are listed, but only abnormal results are  displayed)  Labs Reviewed  BASIC METABOLIC PANEL WITH GFR - Abnormal; Notable for the following components:      Result Value   BUN 29 (*)    Creatinine, Ser 1.38 (*)    GFR, Estimated 39 (*)    All other components within normal limits  CBC - Abnormal; Notable for the following components:   Hemoglobin 15.1 (*)    HCT 47.9 (*)    All other components within normal limits  SURGICAL PCR SCREEN     IMAGES:   EKG:   CV: Echo 07/08/23 Left ventriclar ejection fraction is 65-70% Moderate left ventricular apical hypertrophy is present Left ventricular diastolic function is undetermined Left atrium is normal in size There is mild tricuspid valve regurgitation.   Cardiac Cath 07/10/23 Conclusions: Moderate, non-obstructive coronary artery disease with focal and eccentric 50-60% stenosis in the mid LAD that improves with intracoronary nitroglycerin  and is not hemodynamically significant by RFR (0.93).  Minimal plaquing also noted in the proximal/mid RCA. Normal left ventricular filling pressure (LVEDP 14 mmHg).   Recommendations: Continue medical therapy and risk factor modification to prevent progression of coronary artery disease.  Consider addition of standing calcium  channel blocker for treatment of possible vasospasm. Monitor on telemetry overnight to exclude arrhythmia, as the patient has a history of PAF, NSVT, and PSVT that could have contributed to her presenting chest pain. Restart heparin  infusion 2 hours after TR band has been removed.  Transition back to apixaban  tomorrow  morning if there is no evidence of bleeding or vascular injury at the catheterization site.   Past Medical History:  Diagnosis Date   Anemia    Arthritis    Asymmetrical left sensorineural hearing loss 10/01/2019   Atrial fibrillation (HCC) 10/02/2019   CHF (congestive heart failure) (HCC)    hypertrophic CM,   Chronic kidney disease    CKD2   Dysrhythmia    A. fib  had ablation 11-17-23    Dysrhythmia    NSVT   Essential hypertension 10/03/2019   History of colon polyps    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, left (HCC) 01/17/2017   Meniere's disease 08/29/2019   with hearing loss in Lt ear   Mixed hyperlipidemia 10/03/2019   Multiple dysplastic nevi    Myocardial infarction (HCC)    Other hypertrophic cardiomyopathy (HCC)    Pleural effusion    Pneumonia    Pre-diabetes    Squamous cell carcinoma of head and neck    Moh's surgery    Past Surgical History:  Procedure Laterality Date   A-FLUTTER ABLATION N/A 11/17/2023   Procedure: A-FLUTTER ABLATION;  Surgeon: Inocencio Soyla Lunger, MD;  Location: [facility];  Service: Cardiovascular;  Laterality: N/A;   APPENDECTOMY  1958   ATRIAL FIBRILLATION ABLATION N/A 11/17/2023   Procedure: ATRIAL FIBRILLATION ABLATION;  Surgeon: Inocencio Soyla Lunger, MD;  Location: [facility];  Service: Cardiovascular;  Laterality: N/A;   BREAST BIOPSY  1973   CATARACT EXTRACTION, BILATERAL  2019   COLONOSCOPY  11/07/2016   Colonic polyp status post polypectomy. Minimal sigmoid diverticulosis   CORONARY PRESSURE/FFR STUDY N/A 07/10/2023   Procedure: CORONARY PRESSURE/FFR STUDY;  Surgeon: Mady Bruckner, MD;  Location: [facility];  Service: Cardiovascular;  Laterality: N/A;   DILATION AND CURETTAGE OF UTERUS  1968   LEFT HEART CATH AND CORONARY ANGIOGRAPHY N/A 07/10/2023   Procedure: LEFT HEART CATH AND CORONARY ANGIOGRAPHY;  Surgeon: Mady Bruckner, MD;  Location: [facility];  Service: Cardiovascular;  Laterality: N/A;   MOHS SURGERY Left 2019   Melanoma on left ear   TOTAL KNEE ARTHROPLASTY Right 07/18/2022   Procedure: TOTAL KNEE ARTHROPLASTY;  Surgeon: Melodi Lerner, MD;  Location: [facility];  Service: Orthopedics;  Laterality: Right;   TUBAL LIGATION  1977    MEDICATIONS:  buPROPion  (WELLBUTRIN  XL) 150 MG 24 hr tablet   Calcium  Carb-Cholecalciferol (CALCIUM  600+D3 PO)    carvedilol  (COREG ) 6.25 MG tablet   diltiazem  (CARDIZEM  CD) 240 MG 24 hr capsule   ELIQUIS  5 MG TABS tablet   estradiol (ESTRACE) 0.1 MG/GM vaginal cream   famotidine  (PEPCID ) 20 MG tablet   FARXIGA  10 MG TABS tablet   ketoconazole (NIZORAL) 2 % cream   montelukast  (SINGULAIR ) 10 MG tablet   Multiple Vitamins-Minerals (PRESERVISION AREDS 2 PO)   nitrofurantoin, macrocrystal-monohydrate, (MACROBID) 100 MG capsule   Omega-3 Fatty Acids (OMEGA-3 FISH OIL) 1200 MG CAPS   rosuvastatin  (CRESTOR ) 20 MG tablet   solifenacin (VESICARE) 5 MG tablet   spironolactone -hydrochlorothiazide  (ALDACTAZIDE) 25-25 MG tablet   No current facility-administered medications for this encounter.    Harlene Hoots Ward, PA-C [facility] [phone]

## 2024-02-16 ENCOUNTER — Ambulatory Visit (HOSPITAL_COMMUNITY)
Admission: RE | Admit: 2024-02-16 | Discharge: 2024-02-16 | Disposition: A | Discharge status: Home / Self Care | Source: Ambulatory Visit | Attending: Physician Assistant | Admitting: Physician Assistant

## 2024-02-16 VITALS — BP 124/64 | HR 58 | Ht 66.5 in | Wt 211.0 lb

## 2024-02-16 DIAGNOSIS — D6869 Other thrombophilia: Secondary | ICD-10-CM | POA: Insufficient documentation

## 2024-02-16 DIAGNOSIS — I4891 Unspecified atrial fibrillation: Secondary | ICD-10-CM | POA: Diagnosis not present

## 2024-02-16 DIAGNOSIS — I480 Paroxysmal atrial fibrillation: Secondary | ICD-10-CM | POA: Diagnosis not present

## 2024-02-16 NOTE — Progress Notes (Signed)
 Primary Care Physician: Clemmie Nest, MD Primary Cardiologist: Kardie Tobb, DO Electrophysiologist: Will Gladis Norton, MD  Referring Physician: Damien Braver NP   Monica Anderson is a 78 y.o. female with a history of CAD, HCM, HTN, HLD, Meniere's disease, atrial fibrillation who presents for follow up in the [facility].  The patient was initially diagnosed with atrial fibrillation 2015 and is on Eliquis  for stroke prevention. She was seen by Damien Braver 07/28/23 and found to be in coarse afib vs atypical atrial flutter. She wore a Zio monitor which showed no afib however, patient presented for follow up back in afib. She does have symptoms of fatigue and SOB when in afib. She was started on amiodarone  as a bridge to ablation. S/p afib and flutter ablation with Dr Shelton on 11/17/23.  Patient returns for follow up for atrial fibrillation. She reports that she has done well since her last visit. No interim symptoms of afib. Her nausea resolved off amiodarone . No bleeding issues on anticoagulation. She is scheduled for knee replacement on 9/15.  Today, she  denies symptoms of palpitations, chest pain, shortness of breath, orthopnea, PND, lower extremity edema, dizziness, presyncope, syncope, snoring, daytime somnolence, bleeding, or neurologic sequela. The patient is tolerating medications without difficulties and is otherwise without complaint today.    Atrial Fibrillation Risk Factors:  she does not have symptoms or diagnosis of sleep apnea. she does not have a history of rheumatic fever.   Atrial Fibrillation Management history:  Previous antiarrhythmic drugs: amiodarone   Previous cardioversions: none Previous ablations: 11/17/23 Anticoagulation history: Eliquis   ROS- All systems are reviewed and negative except as per the HPI above.  Past Medical History:  Diagnosis Date   Anemia    Arthritis    Asymmetrical left sensorineural hearing loss  10/01/2019   Atrial fibrillation (HCC) 10/02/2019   CHF (congestive heart failure) (HCC)    hypertrophic CM,   Chronic kidney disease    CKD2   Dysrhythmia    A. fib  had ablation 11-17-23   Dysrhythmia    NSVT   Essential hypertension 10/03/2019   History of colon polyps    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, left (HCC) 01/17/2017   Meniere's disease 08/29/2019   with hearing loss in Lt ear   Mixed hyperlipidemia 10/03/2019   Multiple dysplastic nevi    Myocardial infarction (HCC)    Other hypertrophic cardiomyopathy (HCC)    Pleural effusion    Pneumonia    Pre-diabetes    Squamous cell carcinoma of head and neck    Moh's surgery    Current Outpatient Medications  Medication Sig Dispense Refill   buPROPion  (WELLBUTRIN  XL) 150 MG 24 hr tablet Take 150 mg by mouth in the morning.     Calcium  Carb-Cholecalciferol (CALCIUM  600+D3 PO) Take 1 tablet by mouth in the morning.     carvedilol  (COREG ) 6.25 MG tablet Take 1 tablet (6.25 mg total) by mouth 2 (two) times daily. 180 tablet 3   diltiazem  (CARDIZEM  CD) 240 MG 24 hr capsule Take 1 capsule (240 mg total) by mouth daily. 90 capsule 1   ELIQUIS  5 MG TABS tablet Take 5 mg by mouth 2 (two) times daily.  3   estradiol (ESTRACE) 0.1 MG/GM vaginal cream Place 1 Applicatorful vaginally daily as needed (Pt prefrence).     famotidine  (PEPCID ) 20 MG tablet Take 20 mg by mouth at bedtime.     FARXIGA  10 MG TABS tablet  Take 10 mg by mouth daily.     ketoconazole (NIZORAL) 2 % cream Apply 1 Application topically 2 (two) times daily as needed (irritation.).     montelukast  (SINGULAIR ) 10 MG tablet Take 10 mg by mouth at bedtime.     Multiple Vitamins-Minerals (PRESERVISION AREDS 2 PO) Take 1 tablet by mouth in the morning.     nitrofurantoin, macrocrystal-monohydrate, (MACROBID) 100 MG capsule Take 100 mg by mouth daily.     Omega-3 Fatty Acids (OMEGA-3 FISH OIL) 1200 MG CAPS Take 1,200 mg by mouth every evening.      rosuvastatin  (CRESTOR ) 20 MG tablet Take 1 tablet (20 mg total) by mouth every evening. 90 tablet 3   solifenacin (VESICARE) 5 MG tablet Take 5 mg by mouth daily.     spironolactone -hydrochlorothiazide  (ALDACTAZIDE) 25-25 MG tablet Take 0.5 tablets by mouth in the morning.     No current facility-administered medications for this encounter.    Physical Exam: BP 124/64   Pulse (!) 58   Ht 5' 6.5 (1.689 m)   Wt 95.7 kg   LMP  (LMP Unknown)   BMI 33.55 kg/m   GEN: Well nourished, well developed in no acute distress CARDIAC: Regular rate and rhythm, no murmurs, rubs, gallops RESPIRATORY:  Clear to auscultation without rales, wheezing or rhonchi  ABDOMEN: Soft, non-tender, non-distended EXTREMITIES:  No edema; No deformity    Wt Readings from Last 3 Encounters:  02/16/24 95.7 kg  02/08/24 93 kg  02/06/24 94.8 kg     EKG today demonstrates  SB, LVH Vent. rate 58 BPM PR interval 140 ms QRS duration 96 ms QT/QTcB 462/453 ms   Echo 07/08/23      CHA2DS2-VASc Score = 5  The patient's score is based upon: CHF History: 0 HTN History: 1 Diabetes History: 0 Stroke History: 0 Vascular Disease History: 1 Age Score: 2 Gender Score: 1       ASSESSMENT AND PLAN: Paroxysmal Atrial Fibrillation (ICD10:  I48.0) The patient's CHA2DS2-VASc score is 5, indicating a 7.2% annual risk of stroke.   S/p afib and flutter ablation 11/17/23, now off amiodarone .  Patient appears to be maintaining SR Continue carvedilol  6.25 mg BID Continue diltiazem  240 mg daily Continue Eliquis  5 mg BID  Secondary Hypercoagulable State (ICD10:  D68.69) The patient is at significant risk for stroke/thromboembolism based upon her CHA2DS2-VASc Score of 5.  Continue Apixaban  (Eliquis ). No bleeding issues.   CAD No anginal symptoms Followed by Dr Sheena  HTN Stable on current regimen  HCM Followed by Dr Sheena   Follow up with Dr Inocencio in 6 months.       Daril Kicks PA-C [facility] [address] [place], [place] [address] [phone]

## 2024-02-19 ENCOUNTER — Encounter (HOSPITAL_COMMUNITY): Payer: Self-pay | Admitting: Orthopedic Surgery

## 2024-02-19 ENCOUNTER — Other Ambulatory Visit: Payer: Self-pay

## 2024-02-19 ENCOUNTER — Ambulatory Visit (HOSPITAL_COMMUNITY): Payer: Self-pay | Admitting: Physician Assistant

## 2024-02-19 ENCOUNTER — Ambulatory Visit (HOSPITAL_COMMUNITY): Admitting: Physician Assistant

## 2024-02-19 ENCOUNTER — Ambulatory Visit (HOSPITAL_BASED_OUTPATIENT_CLINIC_OR_DEPARTMENT_OTHER): Payer: Self-pay | Admitting: Anesthesiology

## 2024-02-19 ENCOUNTER — Encounter (HOSPITAL_COMMUNITY)
Admission: RE | Disposition: A | Discharge status: Home / Self Care | Payer: Self-pay | Source: Home / Self Care | Attending: Orthopedic Surgery

## 2024-02-19 ENCOUNTER — Observation Stay (HOSPITAL_COMMUNITY)
Admission: RE | Admit: 2024-02-19 | Discharge: 2024-02-21 | Disposition: A | Discharge status: Home / Self Care | Attending: Orthopedic Surgery | Admitting: Orthopedic Surgery

## 2024-02-19 DIAGNOSIS — I2510 Atherosclerotic heart disease of native coronary artery without angina pectoris: Secondary | ICD-10-CM

## 2024-02-19 DIAGNOSIS — Z23 Encounter for immunization: Secondary | ICD-10-CM | POA: Insufficient documentation

## 2024-02-19 DIAGNOSIS — M25562 Pain in left knee: Secondary | ICD-10-CM | POA: Diagnosis present

## 2024-02-19 DIAGNOSIS — Z79899 Other long term (current) drug therapy: Secondary | ICD-10-CM | POA: Insufficient documentation

## 2024-02-19 DIAGNOSIS — Z85820 Personal history of malignant melanoma of skin: Secondary | ICD-10-CM | POA: Insufficient documentation

## 2024-02-19 DIAGNOSIS — M1712 Unilateral primary osteoarthritis, left knee: Principal | ICD-10-CM | POA: Insufficient documentation

## 2024-02-19 DIAGNOSIS — N182 Chronic kidney disease, stage 2 (mild): Secondary | ICD-10-CM | POA: Insufficient documentation

## 2024-02-19 DIAGNOSIS — G8918 Other acute postprocedural pain: Secondary | ICD-10-CM | POA: Diagnosis not present

## 2024-02-19 DIAGNOSIS — I509 Heart failure, unspecified: Secondary | ICD-10-CM | POA: Diagnosis not present

## 2024-02-19 DIAGNOSIS — Z96651 Presence of right artificial knee joint: Secondary | ICD-10-CM | POA: Insufficient documentation

## 2024-02-19 DIAGNOSIS — Z7901 Long term (current) use of anticoagulants: Secondary | ICD-10-CM | POA: Insufficient documentation

## 2024-02-19 DIAGNOSIS — I110 Hypertensive heart disease with heart failure: Secondary | ICD-10-CM | POA: Diagnosis not present

## 2024-02-19 DIAGNOSIS — I130 Hypertensive heart and chronic kidney disease with heart failure and stage 1 through stage 4 chronic kidney disease, or unspecified chronic kidney disease: Secondary | ICD-10-CM | POA: Insufficient documentation

## 2024-02-19 HISTORY — PX: TOTAL KNEE ARTHROPLASTY: SHX125

## 2024-02-19 SURGERY — ARTHROPLASTY, KNEE, TOTAL
Anesthesia: General | Site: Knee | Laterality: Left

## 2024-02-19 MED ORDER — SODIUM CHLORIDE 0.9 % IV SOLN
INTRAVENOUS | Status: DC | PRN
  Administered 2024-02-19: 80 mL

## 2024-02-19 MED ORDER — ACETAMINOPHEN 10 MG/ML IV SOLN
1000.0000 mg | Freq: Four times a day (QID) | INTRAVENOUS | Status: DC
  Administered 2024-02-19: 1000 mg via INTRAVENOUS
  Filled 2024-02-19: qty 100

## 2024-02-19 MED ORDER — INFLUENZA VAC SPLIT HIGH-DOSE 0.5 ML IM SUSY
0.5000 mL | PREFILLED_SYRINGE | INTRAMUSCULAR | Status: AC
  Administered 2024-02-20: 0.5 mL via INTRAMUSCULAR
  Filled 2024-02-19: qty 0.5

## 2024-02-19 MED ORDER — DROPERIDOL 2.5 MG/ML IJ SOLN: 0.6250 mg | Freq: Once | INTRAMUSCULAR | Status: DC | PRN

## 2024-02-19 MED ORDER — FENTANYL CITRATE (PF) 250 MCG/5ML IJ SOLN
INTRAMUSCULAR | Status: DC | PRN
  Administered 2024-02-19: 25 ug via INTRAVENOUS
  Administered 2024-02-19: 50 ug via INTRAVENOUS

## 2024-02-19 MED ORDER — BISACODYL 10 MG RE SUPP: 10.0000 mg | Freq: Every day | RECTAL | Status: DC | PRN

## 2024-02-19 MED ORDER — PROPOFOL 10 MG/ML IV BOLUS
INTRAVENOUS | Status: AC
  Filled 2024-02-19: qty 20

## 2024-02-19 MED ORDER — FLEET ENEMA RE ENEM: 1.0000 | ENEMA | Freq: Once | RECTAL | Status: DC | PRN

## 2024-02-19 MED ORDER — HYDROMORPHONE HCL 1 MG/ML IJ SOLN
INTRAMUSCULAR | Status: AC
  Filled 2024-02-19: qty 1

## 2024-02-19 MED ORDER — ONDANSETRON HCL 4 MG/2ML IJ SOLN
INTRAMUSCULAR | Status: AC
Start: 2024-02-19 — End: 2024-02-19
  Filled 2024-02-19: qty 2

## 2024-02-19 MED ORDER — LIDOCAINE HCL (PF) 2 % IJ SOLN
INTRAMUSCULAR | Status: DC | PRN
  Administered 2024-02-19: 60 mg via INTRADERMAL

## 2024-02-19 MED ORDER — METHOCARBAMOL 500 MG PO TABS
ORAL_TABLET | ORAL | Status: AC
Start: 2024-02-19 — End: 2024-02-19
  Filled 2024-02-19: qty 1

## 2024-02-19 MED ORDER — ROPIVACAINE HCL 5 MG/ML IJ SOLN
INTRAMUSCULAR | Status: DC | PRN
  Administered 2024-02-19: 20 mL via PERINEURAL

## 2024-02-19 MED ORDER — PHENYLEPHRINE HCL-NACL 20-0.9 MG/250ML-% IV SOLN
INTRAVENOUS | Status: AC
  Filled 2024-02-19: qty 250

## 2024-02-19 MED ORDER — HYDROCHLOROTHIAZIDE 25 MG PO TABS
25.0000 mg | ORAL_TABLET | Freq: Every day | ORAL | Status: DC
  Administered 2024-02-20 – 2024-02-21 (×2): 25 mg via ORAL
  Filled 2024-02-19 (×2): qty 1

## 2024-02-19 MED ORDER — FAMOTIDINE 20 MG PO TABS
20.0000 mg | ORAL_TABLET | Freq: Every day | ORAL | Status: DC
  Administered 2024-02-19 – 2024-02-20 (×2): 20 mg via ORAL
  Filled 2024-02-19 (×2): qty 1

## 2024-02-19 MED ORDER — FENTANYL CITRATE (PF) 250 MCG/5ML IJ SOLN
INTRAMUSCULAR | Status: AC
  Filled 2024-02-19: qty 5

## 2024-02-19 MED ORDER — DEXAMETHASONE SODIUM PHOSPHATE 10 MG/ML IJ SOLN
INTRAMUSCULAR | Status: AC
Start: 2024-02-19 — End: 2024-02-19
  Filled 2024-02-19: qty 1

## 2024-02-19 MED ORDER — ROSUVASTATIN CALCIUM 20 MG PO TABS
20.0000 mg | ORAL_TABLET | Freq: Every evening | ORAL | Status: DC
  Administered 2024-02-20: 20 mg via ORAL
  Filled 2024-02-19: qty 1

## 2024-02-19 MED ORDER — CEFAZOLIN SODIUM-DEXTROSE 2-4 GM/100ML-% IV SOLN
2.0000 g | INTRAVENOUS | Status: AC
  Administered 2024-02-19: 2 g via INTRAVENOUS
  Filled 2024-02-19: qty 100

## 2024-02-19 MED ORDER — BUPIVACAINE LIPOSOME 1.3 % IJ SUSP
INTRAMUSCULAR | Status: AC
  Filled 2024-02-19: qty 20

## 2024-02-19 MED ORDER — PROPOFOL 10 MG/ML IV BOLUS
INTRAVENOUS | Status: DC | PRN
  Administered 2024-02-19: 120 mg via INTRAVENOUS

## 2024-02-19 MED ORDER — EPHEDRINE SULFATE (PRESSORS) 50 MG/ML IJ SOLN
INTRAMUSCULAR | Status: DC | PRN
  Administered 2024-02-19: 7 mg via INTRAVENOUS

## 2024-02-19 MED ORDER — CHLORHEXIDINE GLUCONATE 0.12 % MT SOLN
15.0000 mL | Freq: Once | OROMUCOSAL | Status: AC
  Administered 2024-02-19: 15 mL via OROMUCOSAL

## 2024-02-19 MED ORDER — ONDANSETRON HCL 4 MG/2ML IJ SOLN: 4.0000 mg | Freq: Four times a day (QID) | INTRAMUSCULAR | Status: DC | PRN

## 2024-02-19 MED ORDER — ACETAMINOPHEN 500 MG PO TABS
1000.0000 mg | ORAL_TABLET | Freq: Four times a day (QID) | ORAL | Status: AC
  Administered 2024-02-19 – 2024-02-20 (×3): 1000 mg via ORAL
  Filled 2024-02-19 (×4): qty 2

## 2024-02-19 MED ORDER — PHENYLEPHRINE HCL-NACL 20-0.9 MG/250ML-% IV SOLN
INTRAVENOUS | Status: DC | PRN
Start: 2024-02-19 — End: 2024-02-19
  Administered 2024-02-19: 30 ug/min via INTRAVENOUS

## 2024-02-19 MED ORDER — METOCLOPRAMIDE HCL 5 MG/ML IJ SOLN: 5.0000 mg | Freq: Three times a day (TID) | INTRAMUSCULAR | Status: DC | PRN

## 2024-02-19 MED ORDER — SPIRONOLACTONE 25 MG PO TABS
25.0000 mg | ORAL_TABLET | Freq: Every day | ORAL | Status: DC
  Administered 2024-02-21: 25 mg via ORAL
  Filled 2024-02-19: qty 1

## 2024-02-19 MED ORDER — SUGAMMADEX SODIUM 200 MG/2ML IV SOLN
INTRAVENOUS | Status: AC
  Filled 2024-02-19: qty 2

## 2024-02-19 MED ORDER — TRANEXAMIC ACID-NACL 1000-0.7 MG/100ML-% IV SOLN
1000.0000 mg | INTRAVENOUS | Status: AC
  Administered 2024-02-19: 1000 mg via INTRAVENOUS
  Filled 2024-02-19: qty 100

## 2024-02-19 MED ORDER — SUGAMMADEX SODIUM 200 MG/2ML IV SOLN
INTRAVENOUS | Status: DC | PRN
  Administered 2024-02-19: 200 mg via INTRAVENOUS

## 2024-02-19 MED ORDER — CEFAZOLIN SODIUM-DEXTROSE 2-4 GM/100ML-% IV SOLN
2.0000 g | Freq: Four times a day (QID) | INTRAVENOUS | Status: AC
  Administered 2024-02-19 (×2): 2 g via INTRAVENOUS
  Filled 2024-02-19 (×2): qty 100

## 2024-02-19 MED ORDER — ONDANSETRON HCL 4 MG/2ML IJ SOLN
INTRAMUSCULAR | Status: DC | PRN
Start: 2024-02-19 — End: 2024-02-19
  Administered 2024-02-19: 4 mg via INTRAVENOUS

## 2024-02-19 MED ORDER — SODIUM CHLORIDE 0.9 % IV SOLN
INTRAVENOUS | Status: DC
Start: 2024-02-19 — End: 2024-02-21

## 2024-02-19 MED ORDER — DILTIAZEM HCL ER COATED BEADS 240 MG PO CP24
240.0000 mg | ORAL_CAPSULE | Freq: Every day | ORAL | Status: DC
  Administered 2024-02-20 – 2024-02-21 (×2): 240 mg via ORAL
  Filled 2024-02-19 (×2): qty 1

## 2024-02-19 MED ORDER — SODIUM CHLORIDE 0.9 % IR SOLN
Status: DC | PRN
  Administered 2024-02-19: 1000 mL

## 2024-02-19 MED ORDER — APIXABAN 2.5 MG PO TABS
2.5000 mg | ORAL_TABLET | Freq: Two times a day (BID) | ORAL | Status: DC
  Administered 2024-02-20 – 2024-02-21 (×3): 2.5 mg via ORAL
  Filled 2024-02-19 (×3): qty 1

## 2024-02-19 MED ORDER — METHOCARBAMOL 500 MG PO TABS
500.0000 mg | ORAL_TABLET | Freq: Four times a day (QID) | ORAL | Status: DC | PRN
  Administered 2024-02-19 – 2024-02-21 (×4): 500 mg via ORAL
  Filled 2024-02-19 (×3): qty 1

## 2024-02-19 MED ORDER — DIPHENHYDRAMINE HCL 12.5 MG/5ML PO ELIX: 12.5000 mg | ORAL_SOLUTION | ORAL | Status: DC | PRN

## 2024-02-19 MED ORDER — DEXAMETHASONE SODIUM PHOSPHATE 10 MG/ML IJ SOLN
10.0000 mg | Freq: Once | INTRAMUSCULAR | Status: AC
  Administered 2024-02-20: 10 mg via INTRAVENOUS
  Filled 2024-02-19: qty 1

## 2024-02-19 MED ORDER — SPIRONOLACTONE-HCTZ 25-25 MG PO TABS
0.5000 | ORAL_TABLET | Freq: Every morning | ORAL | Status: DC
Start: 2024-02-20 — End: 2024-02-19

## 2024-02-19 MED ORDER — PROPOFOL 1000 MG/100ML IV EMUL
INTRAVENOUS | Status: AC
  Filled 2024-02-19: qty 200

## 2024-02-19 MED ORDER — ONDANSETRON HCL 4 MG PO TABS: 4.0000 mg | ORAL_TABLET | Freq: Four times a day (QID) | ORAL | Status: DC | PRN

## 2024-02-19 MED ORDER — MIDAZOLAM HCL 2 MG/2ML IJ SOLN: 2.0000 mg | INTRAMUSCULAR | Status: DC

## 2024-02-19 MED ORDER — MONTELUKAST SODIUM 10 MG PO TABS
10.0000 mg | ORAL_TABLET | Freq: Every day | ORAL | Status: DC
  Administered 2024-02-19 – 2024-02-20 (×2): 10 mg via ORAL
  Filled 2024-02-19 (×2): qty 1

## 2024-02-19 MED ORDER — DAPAGLIFLOZIN PROPANEDIOL 10 MG PO TABS
10.0000 mg | ORAL_TABLET | Freq: Every day | ORAL | Status: DC
  Administered 2024-02-20 – 2024-02-21 (×2): 10 mg via ORAL
  Filled 2024-02-19 (×2): qty 1

## 2024-02-19 MED ORDER — DEXAMETHASONE SODIUM PHOSPHATE 10 MG/ML IJ SOLN
8.0000 mg | Freq: Once | INTRAMUSCULAR | Status: AC
  Administered 2024-02-19: 8 mg via INTRAVENOUS

## 2024-02-19 MED ORDER — METOCLOPRAMIDE HCL 5 MG PO TABS: 5.0000 mg | ORAL_TABLET | Freq: Three times a day (TID) | ORAL | Status: DC | PRN

## 2024-02-19 MED ORDER — ROCURONIUM BROMIDE 10 MG/ML (PF) SYRINGE
PREFILLED_SYRINGE | INTRAVENOUS | Status: AC
Start: 2024-02-19 — End: 2024-02-19
  Filled 2024-02-19: qty 10

## 2024-02-19 MED ORDER — TRAMADOL HCL 50 MG PO TABS
50.0000 mg | ORAL_TABLET | Freq: Four times a day (QID) | ORAL | Status: DC | PRN
  Administered 2024-02-19 (×2): 50 mg via ORAL
  Administered 2024-02-20: 100 mg via ORAL
  Filled 2024-02-19: qty 2
  Filled 2024-02-19: qty 1

## 2024-02-19 MED ORDER — PHENYLEPHRINE HCL (PRESSORS) 10 MG/ML IV SOLN
INTRAVENOUS | Status: DC | PRN
  Administered 2024-02-19 (×3): 80 ug via INTRAVENOUS

## 2024-02-19 MED ORDER — LACTATED RINGERS IV SOLN: INTRAVENOUS | Status: DC

## 2024-02-19 MED ORDER — FESOTERODINE FUMARATE ER 4 MG PO TB24
4.0000 mg | ORAL_TABLET | Freq: Every day | ORAL | Status: DC
  Administered 2024-02-20 – 2024-02-21 (×2): 4 mg via ORAL
  Filled 2024-02-19 (×2): qty 1

## 2024-02-19 MED ORDER — PHENOL 1.4 % MT LIQD: 1.0000 | OROMUCOSAL | Status: DC | PRN

## 2024-02-19 MED ORDER — METHOCARBAMOL 1000 MG/10ML IJ SOLN: 500.0000 mg | Freq: Four times a day (QID) | INTRAMUSCULAR | Status: DC | PRN

## 2024-02-19 MED ORDER — NITROFURANTOIN MONOHYD MACRO 100 MG PO CAPS
100.0000 mg | ORAL_CAPSULE | Freq: Every day | ORAL | Status: DC
  Administered 2024-02-20 – 2024-02-21 (×2): 100 mg via ORAL
  Filled 2024-02-19 (×2): qty 1

## 2024-02-19 MED ORDER — OXYCODONE HCL 5 MG PO TABS
5.0000 mg | ORAL_TABLET | ORAL | Status: DC | PRN
  Administered 2024-02-19 – 2024-02-20 (×3): 5 mg via ORAL
  Administered 2024-02-20: 10 mg via ORAL
  Administered 2024-02-20: 5 mg via ORAL
  Administered 2024-02-21 (×2): 10 mg via ORAL
  Filled 2024-02-19 (×2): qty 1
  Filled 2024-02-19: qty 2
  Filled 2024-02-19: qty 1
  Filled 2024-02-19 (×3): qty 2
  Filled 2024-02-19: qty 1

## 2024-02-19 MED ORDER — ORAL CARE MOUTH RINSE: 15.0000 mL | Freq: Once | OROMUCOSAL | Status: AC

## 2024-02-19 MED ORDER — ACETAMINOPHEN 325 MG PO TABS
325.0000 mg | ORAL_TABLET | Freq: Four times a day (QID) | ORAL | Status: DC | PRN
  Administered 2024-02-20: 650 mg via ORAL
  Filled 2024-02-19: qty 2

## 2024-02-19 MED ORDER — LIDOCAINE HCL (PF) 2 % IJ SOLN
INTRAMUSCULAR | Status: AC
  Filled 2024-02-19: qty 5

## 2024-02-19 MED ORDER — FENTANYL CITRATE PF 50 MCG/ML IJ SOSY
100.0000 ug | PREFILLED_SYRINGE | INTRAMUSCULAR | Status: DC
  Administered 2024-02-19: 50 ug via INTRAVENOUS
  Filled 2024-02-19: qty 2

## 2024-02-19 MED ORDER — DOCUSATE SODIUM 100 MG PO CAPS
100.0000 mg | ORAL_CAPSULE | Freq: Two times a day (BID) | ORAL | Status: DC
  Administered 2024-02-19 – 2024-02-21 (×4): 100 mg via ORAL
  Filled 2024-02-19 (×4): qty 1

## 2024-02-19 MED ORDER — SODIUM CHLORIDE (PF) 0.9 % IJ SOLN
INTRAMUSCULAR | Status: AC
Start: 2024-02-19 — End: 2024-02-19
  Filled 2024-02-19: qty 50

## 2024-02-19 MED ORDER — ROCURONIUM BROMIDE 10 MG/ML (PF) SYRINGE
PREFILLED_SYRINGE | INTRAVENOUS | Status: DC | PRN
  Administered 2024-02-19: 60 mg via INTRAVENOUS

## 2024-02-19 MED ORDER — DEXAMETHASONE SODIUM PHOSPHATE 4 MG/ML IJ SOLN
INTRAMUSCULAR | Status: DC | PRN
  Administered 2024-02-19: 4 mg via PERINEURAL

## 2024-02-19 MED ORDER — HYDROMORPHONE HCL 1 MG/ML IJ SOLN
0.2500 mg | INTRAMUSCULAR | Status: DC | PRN
  Administered 2024-02-19 (×2): 0.5 mg via INTRAVENOUS

## 2024-02-19 MED ORDER — CLONIDINE HCL (ANALGESIA) 100 MCG/ML EP SOLN
EPIDURAL | Status: DC | PRN
  Administered 2024-02-19: 60 ug

## 2024-02-19 MED ORDER — HYDROMORPHONE HCL 1 MG/ML IJ SOLN: 0.5000 mg | INTRAMUSCULAR | Status: DC | PRN

## 2024-02-19 MED ORDER — SODIUM CHLORIDE (PF) 0.9 % IJ SOLN
INTRAMUSCULAR | Status: AC
Start: 2024-02-19 — End: 2024-02-19
  Filled 2024-02-19: qty 10

## 2024-02-19 MED ORDER — TRAMADOL HCL 50 MG PO TABS
ORAL_TABLET | ORAL | Status: AC
  Filled 2024-02-19: qty 1

## 2024-02-19 MED ORDER — BUPROPION HCL ER (XL) 150 MG PO TB24
150.0000 mg | ORAL_TABLET | Freq: Every day | ORAL | Status: DC
  Administered 2024-02-20 – 2024-02-21 (×2): 150 mg via ORAL
  Filled 2024-02-19 (×2): qty 1

## 2024-02-19 MED ORDER — 0.9 % SODIUM CHLORIDE (POUR BTL) OPTIME
TOPICAL | Status: DC | PRN
  Administered 2024-02-19: 1000 mL

## 2024-02-19 MED ORDER — CARVEDILOL 6.25 MG PO TABS
6.2500 mg | ORAL_TABLET | Freq: Two times a day (BID) | ORAL | Status: DC
  Administered 2024-02-19 – 2024-02-21 (×4): 6.25 mg via ORAL
  Filled 2024-02-19 (×4): qty 1

## 2024-02-19 MED ORDER — POLYETHYLENE GLYCOL 3350 17 G PO PACK: 17.0000 g | PACK | Freq: Every day | ORAL | Status: DC | PRN

## 2024-02-19 MED ORDER — MENTHOL 3 MG MT LOZG: 1.0000 | LOZENGE | OROMUCOSAL | Status: DC | PRN

## 2024-02-19 MED ORDER — BUPIVACAINE LIPOSOME 1.3 % IJ SUSP: 20.0000 mL | Freq: Once | INTRAMUSCULAR | Status: DC

## 2024-02-19 MED ORDER — POVIDONE-IODINE 10 % EX SWAB
2.0000 | Freq: Once | CUTANEOUS | Status: AC
  Administered 2024-02-19: 2 via TOPICAL

## 2024-02-19 SURGICAL SUPPLY — 41 items
ATTUNE MED DOME PAT 38 KNEE (Knees) IMPLANT
ATTUNE PS FEM LT SZ 6 CEM KNEE (Femur) IMPLANT
ATTUNE PSRP INSR SZ6 10 KNEE (Insert) IMPLANT
BAG COUNTER SPONGE SURGICOUNT (BAG) IMPLANT
BAG ZIPLOCK 12X15 (MISCELLANEOUS) ×1 IMPLANT
BASE TIBIAL ROT PLAT SZ 5 KNEE (Knees) IMPLANT
BLADE SAG 18X100X1.27 (BLADE) ×1 IMPLANT
BLADE SAW SGTL 11.0X1.19X90.0M (BLADE) ×1 IMPLANT
BNDG ELASTIC 6INX 5YD STR LF (GAUZE/BANDAGES/DRESSINGS) ×1 IMPLANT
BOWL SMART MIX CTS (DISPOSABLE) ×1 IMPLANT
CEMENT HV SMART SET (Cement) ×2 IMPLANT
COVER SURGICAL LIGHT HANDLE (MISCELLANEOUS) ×1 IMPLANT
CUFF TRNQT CYL 34X4.125X (TOURNIQUET CUFF) ×1 IMPLANT
DERMABOND ADVANCED .7 DNX12 (GAUZE/BANDAGES/DRESSINGS) ×1 IMPLANT
DRAPE U-SHAPE 47X51 STRL (DRAPES) ×1 IMPLANT
DRSG AQUACEL AG ADV 3.5X10 (GAUZE/BANDAGES/DRESSINGS) ×1 IMPLANT
DURAPREP 26ML APPLICATOR (WOUND CARE) ×1 IMPLANT
ELECT REM PT RETURN 15FT ADLT (MISCELLANEOUS) ×1 IMPLANT
GLOVE BIO SURGEON STRL SZ 6.5 (GLOVE) IMPLANT
GLOVE BIO SURGEON STRL SZ7 (GLOVE) IMPLANT
GLOVE BIO SURGEON STRL SZ8 (GLOVE) ×1 IMPLANT
GLOVE BIOGEL PI IND STRL 7.0 (GLOVE) ×1 IMPLANT
GLOVE BIOGEL PI IND STRL 8 (GLOVE) ×1 IMPLANT
GOWN STRL REUS W/ TWL LRG LVL3 (GOWN DISPOSABLE) ×1 IMPLANT
IMMOBILIZER KNEE 20 THIGH 36 (SOFTGOODS) ×1 IMPLANT
KIT TURNOVER KIT A (KITS) ×1 IMPLANT
MANIFOLD NEPTUNE II (INSTRUMENTS) ×1 IMPLANT
NS IRRIG 1000ML POUR BTL (IV SOLUTION) ×1 IMPLANT
PACK TOTAL KNEE CUSTOM (KITS) ×1 IMPLANT
PADDING CAST COTTON 6X4 STRL (CAST SUPPLIES) ×2 IMPLANT
PENCIL SMOKE EVACUATOR (MISCELLANEOUS) ×1 IMPLANT
PIN STEINMAN FIXATION KNEE (PIN) IMPLANT
PROTECTOR NERVE ULNAR (MISCELLANEOUS) ×1 IMPLANT
SET HNDPC FAN SPRY TIP SCT (DISPOSABLE) ×1 IMPLANT
SUT MNCRL AB 4-0 PS2 18 (SUTURE) ×1 IMPLANT
SUT VIC AB 2-0 CT1 TAPERPNT 27 (SUTURE) ×3 IMPLANT
SUTURE STRATFX 0 PDS 27 VIOLET (SUTURE) ×1 IMPLANT
TOWEL GREEN STERILE FF (TOWEL DISPOSABLE) ×1 IMPLANT
TUBE SUCTION HIGH CAP CLEAR NV (SUCTIONS) ×1 IMPLANT
WATER STERILE IRR 1000ML POUR (IV SOLUTION) ×2 IMPLANT
WRAP KNEE MAXI GEL POST OP (GAUZE/BANDAGES/DRESSINGS) ×1 IMPLANT

## 2024-02-19 NOTE — Interval H&P Note (Signed)
 History and Physical Interval Note:  02/19/2024 6:29 AM  Monica Anderson  has presented today for surgery, with the diagnosis of Left knee osteoarthritis.  The various methods of treatment have been discussed with the patient and family. After consideration of risks, benefits and other options for treatment, the patient has consented to  Procedure(s): ARTHROPLASTY, KNEE, TOTAL (Left) as a surgical intervention.  The patient's history has been reviewed, patient examined, no change in status, stable for surgery.  I have reviewed the patient's chart and labs.  Questions were answered to the patient's satisfaction.     [name]

## 2024-02-19 NOTE — Discharge Instructions (Signed)
 Monica Aluisio, MD Total Joint Specialist [facility] [address] [place], [place] [address] [phone]  TOTAL KNEE REPLACEMENT POSTOPERATIVE DIRECTIONS    Knee Rehabilitation, Guidelines Following Surgery  Results after knee surgery are often greatly improved when you follow the exercise, range of motion and muscle strengthening exercises prescribed by your doctor. Safety measures are also important to protect the knee from further injury. If any of these exercises cause you to have increased pain or swelling in your knee joint, decrease the amount until you are comfortable again and slowly increase them. If you have problems or questions, call your caregiver or physical therapist for advice.   HOME CARE INSTRUCTIONS  Remove items at home which could result in a fall. This includes throw rugs or furniture in walking pathways.  ICE to the affected knee as much as tolerated. Icing helps control swelling. If the swelling is well controlled you will be more comfortable and rehab easier. Continue to use ice on the knee for pain and swelling from surgery. You may notice swelling that will progress down to the foot and ankle. This is normal after surgery. Elevate the leg when you are not up walking on it.    Continue to use the breathing machine which will help keep your temperature down. It is common for your temperature to cycle up and down following surgery, especially at night when you are not up moving around and exerting yourself. The breathing machine keeps your lungs expanded and your temperature down. Do not place pillow under the operative knee, focus on keeping the knee straight while resting  DIET You may resume your previous home diet once you are discharged from the hospital.  DRESSING / WOUND CARE / SHOWERING Keep your bulky bandage on for 2 days. On the third post-operative day you may remove the Ace bandage and gauze. There is a waterproof  adhesive bandage on your skin which will stay in place until your first follow-up appointment. Once you remove this you will not need to place another bandage You may begin showering 3 days following surgery, but do not submerge the incision under water.  ACTIVITY For the first 5 days, the key is rest and control of pain and swelling Do your home exercises twice a day starting on post-operative day 3. On the days you go to physical therapy, just do the home exercises once that day. You should rest, ice and elevate the leg for 50 minutes out of every hour. Get up and walk/stretch for 10 minutes per hour. After 5 days you can increase your activity slowly as tolerated. Walk with your walker as instructed. Use the walker until you are comfortable transitioning to a cane. Walk with the cane in the opposite hand of the operative leg. You may discontinue the cane once you are comfortable and walking steadily. Avoid periods of inactivity such as sitting longer than an hour when not asleep. This helps prevent blood clots.  You may discontinue the knee immobilizer once you are able to perform a straight leg raise while lying down. You may resume a sexual relationship in one month or when given the OK by your doctor.  You may return to work once you are cleared by your doctor.  Do not drive a car for 6 weeks or until released by your surgeon.  Do not drive while taking narcotics.  TED HOSE STOCKINGS Wear the elastic stockings on both legs for three weeks following surgery during the   day. You may remove them at night for sleeping.  WEIGHT BEARING Weight bearing as tolerated with assist device (walker, cane, etc) as directed, use it as long as suggested by your surgeon or therapist, typically at least 4-6 weeks.  POSTOPERATIVE CONSTIPATION PROTOCOL Constipation - defined medically as fewer than three stools per week and severe constipation as less than one stool per week.  One of the most common issues  patients have following surgery is constipation.  Even if you have a regular bowel pattern at home, your normal regimen is likely to be disrupted due to multiple reasons following surgery.  Combination of anesthesia, postoperative narcotics, change in appetite and fluid intake all can affect your bowels.  In order to avoid complications following surgery, here are some recommendations in order to help you during your recovery period.  Colace (docusate) - Pick up an over-the-counter form of Colace or another stool softener and take twice a day as long as you are requiring postoperative pain medications.  Take with a full glass of water daily.  If you experience loose stools or diarrhea, hold the colace until you stool forms back up. If your symptoms do not get better within 1 week or if they get worse, check with your doctor. Dulcolax (bisacodyl) - Pick up over-the-counter and take as directed by the product packaging as needed to assist with the movement of your bowels.  Take with a full glass of water.  Use this product as needed if not relieved by Colace only.  MiraLax (polyethylene glycol) - Pick up over-the-counter to have on hand. MiraLax is a solution that will increase the amount of water in your bowels to assist with bowel movements.  Take as directed and can mix with a glass of water, juice, soda, coffee, or tea. Take if you go more than two days without a movement. Do not use MiraLax more than once per day. Call your doctor if you are still constipated or irregular after using this medication for 7 days in a row.  If you continue to have problems with postoperative constipation, please contact the office for further assistance and recommendations.  If you experience "the worst abdominal pain ever" or develop nausea or vomiting, please contact the office immediatly for further recommendations for treatment.  ITCHING If you experience itching with your medications, try taking only a single pain  pill, or even half a pain pill at a time.  You can also use Benadryl over the counter for itching or also to help with sleep.   MEDICATIONS See your medication summary on the "After Visit Summary" that the nursing staff will review with you prior to discharge.  You may have some home medications which will be placed on hold until you complete the course of blood thinner medication.  It is important for you to complete the blood thinner medication as prescribed by your surgeon.  Continue your approved medications as instructed at time of discharge.  PRECAUTIONS If you experience chest pain or shortness of breath - call 911 immediately for transfer to the hospital emergency department.  If you develop a fever greater that 101 F, purulent drainage from wound, increased redness or drainage from wound, foul odor from the wound/dressing, or calf pain - CONTACT YOUR SURGEON.                                                     FOLLOW-UP APPOINTMENTS Make sure you keep all of your appointments after your operation with your surgeon and caregivers. You should call the office at the above phone number and make an appointment for approximately two weeks after the date of your surgery or on the date instructed by your surgeon outlined in the "After Visit Summary".  RANGE OF MOTION AND STRENGTHENING EXERCISES  Rehabilitation of the knee is important following a knee injury or an operation. After just a few days of immobilization, the muscles of the thigh which control the knee become weakened and shrink (atrophy). Knee exercises are designed to build up the tone and strength of the thigh muscles and to improve knee motion. Often times heat used for twenty to thirty minutes before working out will loosen up your tissues and help with improving the range of motion but do not use heat for the first two weeks following surgery. These exercises can be done on a training (exercise) mat, on the floor, on a table or on a bed.  Use what ever works the best and is most comfortable for you Knee exercises include:  Leg Lifts - While your knee is still immobilized in a splint or cast, you can do straight leg raises. Lift the leg to 60 degrees, hold for 3 sec, and slowly lower the leg. Repeat 10-20 times 2-3 times daily. Perform this exercise against resistance later as your knee gets better.  Quad and Hamstring Sets - Tighten up the muscle on the front of the thigh (Quad) and hold for 5-10 sec. Repeat this 10-20 times hourly. Hamstring sets are done by pushing the foot backward against an object and holding for 5-10 sec. Repeat as with quad sets.  Leg Slides: Lying on your back, slowly slide your foot toward your buttocks, bending your knee up off the floor (only go as far as is comfortable). Then slowly slide your foot back down until your leg is flat on the floor again. Angel Wings: Lying on your back spread your legs to the side as far apart as you can without causing discomfort.  A rehabilitation program following serious knee injuries can speed recovery and prevent re-injury in the future due to weakened muscles. Contact your doctor or a physical therapist for more information on knee rehabilitation.   POST-OPERATIVE OPIOID TAPER INSTRUCTIONS: It is important to wean off of your opioid medication as soon as possible. If you do not need pain medication after your surgery it is ok to stop day one. Opioids include: Codeine, Hydrocodone(Norco, Vicodin), Oxycodone(Percocet, oxycontin) and hydromorphone amongst others.  Long term and even short term use of opiods can cause: Increased pain response Dependence Constipation Depression Respiratory depression And more.  Withdrawal symptoms can include Flu like symptoms Nausea, vomiting And more Techniques to manage these symptoms Hydrate well Eat regular healthy meals Stay active Use relaxation techniques(deep breathing, meditating, yoga) Do Not substitute Alcohol to help  with tapering If you have been on opioids for less than two weeks and do not have pain than it is ok to stop all together.  Plan to wean off of opioids This plan should start within one week post op of your joint replacement. Maintain the same interval or time between taking each dose and first decrease the dose.  Cut the total daily intake of opioids by one tablet each day Next start to increase the time between doses. The last dose that should be eliminated is the evening dose.   IF YOU ARE TRANSFERRED TO   A SKILLED REHAB FACILITY If the patient is transferred to a skilled rehab facility following release from the hospital, a list of the current medications will be sent to the facility for the patient to continue.  When discharged from the skilled rehab facility, please have the facility set up the patient's Home Health Physical Therapy prior to being released. Also, the skilled facility will be responsible for providing the patient with their medications at time of release from the facility to include their pain medication, the muscle relaxants, and their blood thinner medication. If the patient is still at the rehab facility at time of the two week follow up appointment, the skilled rehab facility will also need to assist the patient in arranging follow up appointment in our office and any transportation needs.  MAKE SURE YOU:  Understand these instructions.  Get help right away if you are not doing well or get worse.   DENTAL ANTIBIOTICS:  In most cases prophylactic antibiotics for Dental procdeures after total joint surgery are not necessary.  Exceptions are as follows:  1. History of prior total joint infection  2. Severely immunocompromised (Organ Transplant, cancer chemotherapy, Rheumatoid biologic meds such as Humera)  3. Poorly controlled diabetes (A1C &gt; 8.0, blood glucose over 200)  If you have one of these conditions, contact your surgeon for an antibiotic prescription,  prior to your dental procedure.    Pick up stool softner and laxative for home use following surgery while on pain medications. Do not submerge incision under water. Please use good hand washing techniques while changing dressing each day. May shower starting three days after surgery. Please use a clean towel to pat the incision dry following showers. Continue to use ice for pain and swelling after surgery. Do not use any lotions or creams on the incision until instructed by your surgeon.  

## 2024-02-19 NOTE — OR Nursing (Signed)
 Patient reports tibial nerve implant is off

## 2024-02-19 NOTE — Anesthesia Procedure Notes (Signed)
 Anesthesia Regional Block: Adductor canal block   Pre-Anesthetic Checklist: , timeout performed,  Correct Patient, Correct Site, Correct Laterality,  Correct Procedure, Correct Position, site marked,  Risks and benefits discussed,  Surgical consent,  Pre-op evaluation,  At surgeon's request and post-op pain management  Laterality: Lower and Left  Prep: chloraprep       Needles:  Injection technique: Single-shot  Needle Type: Stimiplex     Needle Length: 9cm  Needle Gauge: 21     Additional Needles:   Procedures:,,,, ultrasound used (permanent image in chart),,    Narrative:  Start time: 02/19/2024 7:52 AM End time: 02/19/2024 8:12 AM Injection made incrementally with aspirations every 5 mL.  Performed by: Personally  Anesthesiologist: Darlyn Rush, MD  Additional Notes: BP cuff, EKG monitors applied. Sedation begun. Artery and nerve location verified with ultrasound. Anesthetic injected incrementally (5ml), slowly, and after negative aspirations under direct u/s guidance. Good fascial/perineural spread. Tolerated well.

## 2024-02-19 NOTE — Evaluation (Signed)
 Physical Therapy Evaluation Patient Details Name: Monica Anderson MRN: [id number] DOB: [birth year] Today's Date: 02/19/2024  History of Present Illness  78 y.o. female admitted 02/19/24 for L TKA. PMH: Meniere's, afib, R TKA 07/2022, HTN, OA.  Clinical Impression  Pt is s/p TKA resulting in the deficits listed below (see PT Problem List). Pt's BLEs buckled with attempted bed to bedside commode transfer with RW requiring max assist to prevent a fall, suspect spinal isn't fully worn off despite sensation being fully intact to light touch.  Initiated TKA HEP. Good progress expected.  Pt will benefit from acute skilled PT to increase their independence and safety with mobility to allow discharge.          If plan is discharge home, recommend the following: A little help with walking and/or transfers;A little help with bathing/dressing/bathroom;Assistance with cooking/housework;Assist for transportation;Help with stairs or ramp for entrance   Can travel by private vehicle        Equipment Recommendations Rolling walker (2 wheels)  Recommendations for Other Services       Functional Status Assessment Patient has had a recent decline in their functional status and demonstrates the ability to make significant improvements in function in a reasonable and predictable amount of time.     Precautions / Restrictions Precautions Precautions: Knee Recall of Precautions/Restrictions: Intact Precaution/Restrictions Comments: reviewed no pillow under knee Restrictions Weight Bearing Restrictions Per Provider Order: No Other Position/Activity Restrictions: WBAT      Mobility  Bed Mobility Overal bed mobility: Modified Independent Bed Mobility: Supine to Sit     Supine to sit: Modified independent (Device/Increase time), HOB elevated, Used rails          Transfers Overall transfer level: Needs assistance Equipment used: Rolling walker (2 wheels) Transfers: Sit to/from Stand, Bed to  chair/wheelchair/BSC Sit to Stand: Min assist Stand pivot transfers: Max assist, +2 physical assistance         General transfer comment: +2 max assist to pivot 2* BLEs buckled (suspect final not fully worn off); bed to bedside commode, then to recliner    Ambulation/Gait               General Gait Details: deferred 2* BLEs buckling  Stairs            Wheelchair Mobility     Tilt Bed    Modified Rankin (Stroke Patients Only)       Balance Overall balance assessment: Needs assistance Sitting-balance support: No upper extremity supported, Feet supported Sitting balance-Leahy Scale: Good     Standing balance support: Bilateral upper extremity supported, During functional activity, Reliant on assistive device for balance Standing balance-Leahy Scale: Poor                               Pertinent Vitals/Pain Pain Assessment Pain Assessment: 0-10 Pain Score: 4  Pain Location: L knee Pain Descriptors / Indicators: Sore Pain Intervention(s): Limited activity within patient's tolerance, Monitored during session, Premedicated before session, Repositioned, Ice applied    Home Living Family/patient expects to be discharged to:: Private residence Living Arrangements: Spouse/significant other Available Help at Discharge: Family;Available 24 hours/day Type of Home: House Home Access: Stairs to enter Entrance Stairs-Rails: None Entrance Stairs-Number of Steps: 2   Home Layout: One level   Additional Comments: has old RW that is broken    Prior Function Prior Level of Function : Independent/Modified Independent  Mobility Comments: walked without AD, 1 fall in the garden in past 6 months ADLs Comments: independent     Extremity/Trunk Assessment   Upper Extremity Assessment Upper Extremity Assessment: Overall WFL for tasks assessed    Lower Extremity Assessment Lower Extremity Assessment: LLE deficits/detail LLE Deficits /  Details: SLR 2/5, knee ext -3/5 LLE Sensation: WNL    Cervical / Trunk Assessment Cervical / Trunk Assessment: Normal  Communication   Communication Communication: No apparent difficulties    Cognition Arousal: Alert Behavior During Therapy: WFL for tasks assessed/performed   PT - Cognitive impairments: No apparent impairments                         Following commands: Intact       Cueing Cueing Techniques: Verbal cues     General Comments      Exercises Total Joint Exercises Ankle Circles/Pumps: AROM, Both, 10 reps, Supine Quad Sets: AROM, Both, 5 reps, Supine   Assessment/Plan    PT Assessment Patient needs continued PT services  PT Problem List Decreased strength;Decreased activity tolerance;Decreased balance;Decreased mobility;Pain       PT Treatment Interventions DME instruction;Gait training;Therapeutic exercise;Therapeutic activities;Patient/family education    PT Goals (Current goals can be found in the Care Plan section)  Acute Rehab PT Goals Patient Stated Goal: go fishing at beach PT Goal Formulation: With patient Time For Goal Achievement: 03/04/24 Potential to Achieve Goals: Good    Frequency 7X/week     Co-evaluation               AM-PAC PT 6 Clicks Mobility  Outcome Measure Help needed turning from your back to your side while in a flat bed without using bedrails?: A Little Help needed moving from lying on your back to sitting on the side of a flat bed without using bedrails?: A Little Help needed moving to and from a bed to a chair (including a wheelchair)?: Total Help needed standing up from a chair using your arms (e.g., wheelchair or bedside chair)?: A Lot Help needed to walk in hospital room?: Total Help needed climbing 3-5 steps with a railing? : Total 6 Click Score: 11    End of Session Equipment Utilized During Treatment: Gait belt Activity Tolerance: Patient tolerated treatment well Patient left: in  chair;with chair alarm set;with call bell/phone within reach;with nursing/sitter in room;with family/visitor present Nurse Communication: Mobility status PT Visit Diagnosis: Muscle weakness (generalized) (M62.81);Difficulty in walking, not elsewhere classified (R26.2);Pain Pain - Right/Left: Left Pain - part of body: Knee    Time: 8676-8650 PT Time Calculation (min) (ACUTE ONLY): 26 min   Charges:   PT Evaluation $PT Eval Moderate Complexity: 1 Mod PT Treatments $Therapeutic Activity: 8-22 mins PT General Charges $$ ACUTE PT VISIT: 1 Visit         Sylvan Delon Copp PT 02/19/2024  Acute Rehabilitation Services  Office [phone]

## 2024-02-19 NOTE — Op Note (Signed)
 OPERATIVE REPORT-TOTAL KNEE ARTHROPLASTY   Pre-operative diagnosis- Osteoarthritis  Left knee(s)  Post-operative diagnosis- Osteoarthritis Left knee(s)  Procedure-  Left  Total Knee Arthroplasty  Surgeon- Monica GAILS. Duvid Smalls, MD  Assistant- Roxie Mess, PA-C   Anesthesia-  Adductor canal block and spinal  EBL-30 mL   Drains None  Tourniquet time-  Total Tourniquet Time Documented: Thigh (Left) - 33 minutes Total: Thigh (Left) - 33 minutes     Complications- None  Condition-PACU - hemodynamically stable.   Brief Clinical Note  Monica Anderson is a 78 y.o. year old female with end stage OA of her left knee with progressively worsening pain and dysfunction. She has constant pain, with activity and at rest and significant functional deficits with difficulties even with ADLs. She has had extensive non-op management including analgesics, injections of cortisone and viscosupplements, and home exercise program, but remains in significant pain with significant dysfunction. Radiographs show bone on bone arthritis medial and patellofemoral. She presents now for left Total Knee Arthroplasty.     Procedure in detail---   The patient is brought into the operating room and positioned supine on the operating table. After successful administration of  Adductor canal block and spinal,   a tourniquet is placed high on the  Left thigh(s) and the lower extremity is prepped and draped in the usual sterile fashion. Time out is performed by the operating team and then the  Left lower extremity is wrapped in Esmarch, knee flexed and the tourniquet inflated to 300 mmHg.       A midline incision is made with a ten blade through the subcutaneous tissue to the level of the extensor mechanism. A fresh blade is used to make a medial parapatellar arthrotomy. Soft tissue over the proximal medial tibia is subperiosteally elevated to the joint line with a knife and into the semimembranosus bursa with a Cobb  elevator. Soft tissue over the proximal lateral tibia is elevated with attention being paid to avoiding the patellar tendon on the tibial tubercle. The patella is everted, knee flexed 90 degrees and the ACL and PCL are removed. Findings are bone on bone medial and patellofemoral with large global osteophytes        The drill is used to create a starting hole in the distal femur and the canal is thoroughly irrigated with sterile saline to remove the fatty contents. The 5 degree Left  valgus alignment guide is placed into the femoral canal and the distal femoral cutting block is pinned to remove 10 mm off the distal femur. Resection is made with an oscillating saw.      The tibia is subluxed forward and the menisci are removed. The extramedullary alignment guide is placed referencing proximally at the medial aspect of the tibial tubercle and distally along the second metatarsal axis and tibial crest. The block is pinned to remove 2mm off the more deficient medial  side. Resection is made with an oscillating saw. Size 5is the most appropriate size for the tibia and the proximal tibia is prepared with the modular drill and keel punch for that size.      The femoral sizing guide is placed and size 6 is most appropriate. Rotation is marked off the epicondylar axis and confirmed by creating a rectangular flexion gap at 90 degrees. The size 6 cutting block is pinned in this rotation and the anterior, posterior and chamfer cuts are made with the oscillating saw. The intercondylar block is then placed and that cut is made.  Trial size 5 tibial component, trial size 6 posterior stabilized femur and a 10  mm posterior stabilized rotating platform insert trial is placed. Full extension is achieved with excellent varus/valgus and anterior/posterior balance throughout full range of motion. The patella is everted and thickness measured to be 22  mm. Free hand resection is taken to 12 mm, a 38 template is placed, lug holes  are drilled, trial patella is placed, and it tracks normally. Osteophytes are removed off the posterior femur with the trial in place. All trials are removed and the cut bone surfaces prepared with pulsatile lavage. Cement is mixed and once ready for implantation, the size 5 tibial implant, size  6 posterior stabilized femoral component, and the size 38 patella are cemented in place and the patella is held with the clamp. The trial insert is placed and the knee held in full extension. The Exparel  (20 ml mixed with 60 ml saline) is injected into the extensor mechanism, posterior capsule, medial and lateral gutters and subcutaneous tissues.  All extruded cement is removed and once the cement is hard the permanent 10 mm posterior stabilized rotating platform insert is placed into the tibial tray.      The wound is copiously irrigated with saline solution and the extensor mechanism closed with # 0 Stratofix suture. The tourniquet is released for a total tourniquet time of 33  minutes. Flexion against gravity is 140 degrees and the patella tracks normally. Subcutaneous tissue is closed with 2.0 vicryl and subcuticular with running 4.0 Monocryl. The incision is cleaned and dried and steri-strips and a bulky sterile dressing are applied. The limb is placed into a knee immobilizer and the patient is awakened and transported to recovery in stable condition.      Please note that a surgical assistant was a medical necessity for this procedure in order to perform it in a safe and expeditious manner. Surgical assistant was necessary to retract the ligaments and vital neurovascular structures to prevent injury to them and also necessary for proper positioning of the limb to allow for anatomic placement of the prosthesis.   Monica ROCKFORD Mabeline Varas, MD    02/19/2024, 9:35 AM

## 2024-02-19 NOTE — Care Plan (Signed)
 Ortho Bundle Case Management Note  Patient Details  Name: Monica Anderson MRN: [id number] Date of Birth: 03/06/1946  LT TKA on 02/19/24  DCP: Home with husband DME: No needs, has RW PT: [facility]                   DME Arranged:  N/A DME Agency:  NA  HH Arranged:    HH Agency:     Additional Comments: Please contact me with any questions of if this plan should need to change.  Burnard Dross, Case Manager [facility]  [phone]  Ext. [phone]   02/19/2024, 8:47 AM

## 2024-02-19 NOTE — Transfer of Care (Signed)
 Immediate Anesthesia Transfer of Care Note  Patient: Monica Anderson  Procedure(s) Performed: ARTHROPLASTY, KNEE, TOTAL (Left: Knee)  Patient Location: PACU  Anesthesia Type:General  Level of Consciousness: awake, alert , oriented, and patient cooperative  Airway & Oxygen Therapy: Patient Spontanous Breathing and Patient connected to face mask oxygen  Post-op Assessment: Report given to RN and Post -op Vital signs reviewed and stable  Post vital signs: Reviewed and stable  Last Vitals:  Vitals Value Taken Time  BP 130/60 02/19/24 09:57  Temp    Pulse 56 02/19/24 10:00  Resp 12 02/19/24 10:00  SpO2 100 % 02/19/24 10:00  Vitals shown include unfiled device data.  Last Pain:  Vitals:   02/19/24 0812  TempSrc:   PainSc: 2          Complications: No notable events documented.

## 2024-02-19 NOTE — Anesthesia Postprocedure Evaluation (Signed)
 Anesthesia Post Note  Patient: Monica Anderson  Procedure(s) Performed: ARTHROPLASTY, KNEE, TOTAL (Left: Knee)     Patient location during evaluation: PACU Anesthesia Type: General Level of consciousness: sedated and patient cooperative Pain management: pain level controlled Vital Signs Assessment: post-procedure vital signs reviewed and stable Respiratory status: spontaneous breathing Cardiovascular status: stable Anesthetic complications: no   No notable events documented.  Last Vitals:  Vitals:   02/19/24 1417 02/19/24 1725  BP: (!) 134/56 (!) 127/58  Pulse: 62 64  Resp: 15 16  Temp: (!) 36.4 C 36.7 C  SpO2: 95% 95%    Last Pain:  Vitals:   02/19/24 1800  TempSrc:   PainSc: 2                  Norleen Pope

## 2024-02-19 NOTE — Progress Notes (Signed)
 Orthopedic Tech Progress Note Patient Details:  Emalie Mcwethy 05-26-1946 [id number] Applied CPM per order. Will remove at 2:19pm. CPM Left Knee CPM Left Knee: On Left Knee Flexion (Degrees): 40 Left Knee Extension (Degrees): 10  Post Interventions Patient Tolerated: Well Instructions Provided: Adjustment of device, Care of device, Poper ambulation with device Ortho Devices Type of Ortho Device: CPM padding Ortho Device/Splint Location: LLE Ortho Device/Splint Interventions: Ordered, Application, Adjustment   Post Interventions Patient Tolerated: Well Instructions Provided: Adjustment of device, Care of device, Poper ambulation with device  Morna Pink 02/19/2024, 10:18 AM

## 2024-02-19 NOTE — Anesthesia Procedure Notes (Signed)
 Procedure Name: Intubation Date/Time: 02/19/2024 8:37 AM  Performed by: Kathern Rollene LABOR, CRNAPre-anesthesia Checklist: Patient identified, Emergency Drugs available, Suction available and Patient being monitored Patient Re-evaluated:Patient Re-evaluated prior to induction Oxygen Delivery Method: Circle system utilized Preoxygenation: Pre-oxygenation with 100% oxygen Induction Type: IV induction Ventilation: Mask ventilation without difficulty Laryngoscope Size: Mac and 3 Grade View: Grade I Tube type: Oral Tube size: 7.0 mm Number of attempts: 1 Airway Equipment and Method: Stylet Placement Confirmation: ETT inserted through vocal cords under direct vision, positive ETCO2 and breath sounds checked- equal and bilateral Secured at: 23 cm Tube secured with: Tape Dental Injury: Teeth and Oropharynx as per pre-operative assessment

## 2024-02-20 ENCOUNTER — Telehealth: Payer: Self-pay

## 2024-02-20 ENCOUNTER — Encounter (HOSPITAL_COMMUNITY): Payer: Self-pay | Admitting: Orthopedic Surgery

## 2024-02-20 DIAGNOSIS — N182 Chronic kidney disease, stage 2 (mild): Secondary | ICD-10-CM | POA: Diagnosis not present

## 2024-02-20 DIAGNOSIS — Z23 Encounter for immunization: Secondary | ICD-10-CM | POA: Diagnosis not present

## 2024-02-20 DIAGNOSIS — I130 Hypertensive heart and chronic kidney disease with heart failure and stage 1 through stage 4 chronic kidney disease, or unspecified chronic kidney disease: Secondary | ICD-10-CM | POA: Diagnosis not present

## 2024-02-20 DIAGNOSIS — Z85820 Personal history of malignant melanoma of skin: Secondary | ICD-10-CM | POA: Diagnosis not present

## 2024-02-20 DIAGNOSIS — Z96651 Presence of right artificial knee joint: Secondary | ICD-10-CM | POA: Diagnosis not present

## 2024-02-20 DIAGNOSIS — M1712 Unilateral primary osteoarthritis, left knee: Secondary | ICD-10-CM | POA: Diagnosis not present

## 2024-02-20 LAB — BASIC METABOLIC PANEL WITH GFR
Anion gap: 11 (ref 5–15)
BUN: 12 mg/dL (ref 8–23)
CO2: 22 mmol/L (ref 22–32)
Calcium: 8.7 mg/dL — ABNORMAL LOW (ref 8.9–10.3)
Chloride: 107 mmol/L (ref 98–111)
Creatinine, Ser: 0.86 mg/dL (ref 0.44–1.00)
GFR, Estimated: >60 mL/min (ref >60)
Glucose, Bld: 175 mg/dL — ABNORMAL HIGH (ref 70–99)
Potassium: 3.7 mmol/L (ref 3.5–5.1)
Sodium: 139 mmol/L (ref 135–145)

## 2024-02-20 LAB — CBC
HCT: 39.8 % (ref 36.0–46.0)
Hemoglobin: 12.5 g/dL (ref 12.0–15.0)
MCH: 30.9 pg (ref 26.0–34.0)
MCHC: 31.4 g/dL (ref 30.0–36.0)
MCV: 98.5 fL (ref 80.0–100.0)
Platelets: 156 K/uL (ref 150–400)
RBC: 4.04 MIL/uL (ref 3.87–5.11)
RDW: 14.2 % (ref 11.5–15.5)
WBC: 10.2 K/uL (ref 4.0–10.5)
nRBC: 0 % (ref 0.0–0.2)

## 2024-02-20 MED ORDER — TRAMADOL HCL 50 MG PO TABS: 50.0000 mg | ORAL_TABLET | Freq: Four times a day (QID) | ORAL | 0 refills | Status: AC | PRN

## 2024-02-20 MED ORDER — ONDANSETRON HCL 4 MG PO TABS: 4.0000 mg | ORAL_TABLET | Freq: Four times a day (QID) | ORAL | 0 refills | Status: AC | PRN

## 2024-02-20 MED ORDER — METHOCARBAMOL 500 MG PO TABS: 500.0000 mg | ORAL_TABLET | Freq: Four times a day (QID) | ORAL | 0 refills | Status: AC | PRN

## 2024-02-20 MED ORDER — OXYCODONE HCL 5 MG PO TABS: 5.0000 mg | ORAL_TABLET | Freq: Four times a day (QID) | ORAL | 0 refills | Status: AC | PRN

## 2024-02-20 NOTE — Progress Notes (Signed)
 Subjective: 1 Day Post-Op Procedure(s) (LRB): ARTHROPLASTY, KNEE, TOTAL (Left) Patient reports pain as mild.   Patient seen in rounds by Dr. Melodi. Patient is well, and has had no acute complaints or problems No issues overnight. Denies chest pain, SOB, or calf pain. Voiding without difficulty We will continue therapy today  Objective: Vital signs in last 24 hours: Temp:  [97.5 F (36.4 C)-98.4 F (36.9 C)] 97.8 F (36.6 C) (09/16 0605) Pulse Rate:  [53-65] 55 (09/16 0605) Resp:  [9-18] 18 (09/16 0605) BP: (111-141)/(52-68) 125/61 (09/16 0605) SpO2:  [89 %-100 %] 93 % (09/16 0605) Weight:  [95.7 kg] 95.7 kg (09/15 1120)  Intake/Output from previous day:  Intake/Output Summary (Last 24 hours) at 02/20/2024 0808 Last data filed at 02/20/2024 9394 Gross per 24 hour  Intake 3710.2 ml  Output 730 ml  Net 2980.2 ml     Intake/Output this shift: No intake/output data recorded.  Labs: Recent Labs    02/20/24 0335  HGB 12.5   Recent Labs    02/20/24 0335  WBC 10.2  RBC 4.04  HCT 39.8  PLT 156   Recent Labs    02/20/24 0335  NA 139  K 3.7  CL 107  CO2 22  BUN 12  CREATININE 0.86  GLUCOSE 175*  CALCIUM  8.7*   No results for input(s): LABPT, INR in the last 72 hours.  Exam: General - Patient is Alert and Oriented Extremity - Neurologically intact Neurovascular intact Sensation intact distally Dorsiflexion/Plantar flexion intact Dressing - dressing C/D/I Motor Function - intact, moving foot and toes well on exam.   Past Medical History:  Diagnosis Date   Anemia    Arthritis    Asymmetrical left sensorineural hearing loss 10/01/2019   Atrial fibrillation (HCC) 10/02/2019   CHF (congestive heart failure) (HCC)    hypertrophic CM,   Chronic kidney disease    CKD2   Dysrhythmia    A. fib  had ablation 11-17-23   Dysrhythmia    NSVT   Essential hypertension 10/03/2019   History of colon polyps    Malignant melanoma of skin of ear and external  auditory canal, left (HCC) 01/17/2017   Meniere's disease 08/29/2019   with hearing loss in Lt ear   Mixed hyperlipidemia 10/03/2019   Multiple dysplastic nevi    Myocardial infarction (HCC)    Other hypertrophic cardiomyopathy (HCC)    Pleural effusion    Pneumonia    Pre-diabetes    Squamous cell carcinoma of head and neck    Moh's surgery    Assessment/Plan: 1 Day Post-Op Procedure(s) (LRB): ARTHROPLASTY, KNEE, TOTAL (Left) Principal Problem:   Osteoarthritis of left knee Active Problems:   Primary osteoarthritis of left knee  Estimated body mass index is 33.04 kg/m as calculated from the following:   Height as of this encounter: 5' 7 (1.702 m).   Weight as of this encounter: 95.7 kg. Advance diet Up with therapy D/C IV fluids   Patient's anticipated LOS is less than 2 midnights, meeting these requirements: - Lives within 1 hour of care - Has a competent adult at home to recover with post-op recover - NO history of  - Chronic pain requiring opioids  - Diabetes  - Coronary Artery Disease  - Heart failure  - Heart attack  - Stroke  - DVT/VTE  - Respiratory Failure/COPD  - Renal failure  - Anemia  - Advanced Liver disease   DVT Prophylaxis - Eliquis  Weight bearing as tolerated. Continue therapy.  Plan is to go Home after hospital stay. Plan for discharge later today if progresses with therapy and meeting goals. Scheduled for OPPT at [facility]. Follow-up in the office in 2 weeks.  The PDMP database was reviewed today prior to any opioid medications being prescribed to this patient.  Roxie Mess, PA-C Orthopedic Surgery [phone] 02/20/2024, 8:08 AM

## 2024-02-20 NOTE — TOC Transition Note (Signed)
 Transition of Care [facility]) - Discharge Note   Patient Details  Name: Oleva Koo MRN: [id number] Date of Birth: 1946-03-11  [facility] CM/SW Contact:  Alfonse JONELLE Rex, RN Phone Number: 02/20/2024, 11:11 AM   Clinical Narrative:  Met with patient at bedside to review dc therapy and home equipment needs, pt confirmed OPPT [facility] in [place], [employer] delivered RW to bedside. MOON completed. No TOC needs.      Final next level of care: OP Rehab Barriers to Discharge: No Barriers Identified   Patient Goals and CMS Choice Patient states their goals for this hospitalization and ongoing recovery are:: return home          Discharge Placement                       Discharge Plan and Services Additional resources added to the After Visit Summary for                  DME Arranged: N/A DME Agency: NA                  Social Drivers of Health (SDOH) Interventions SDOH Screenings   Food Insecurity: No Food Insecurity (02/19/2024)  Housing: Low Risk  (02/19/2024)  Transportation Needs: No Transportation Needs (02/19/2024)  Utilities: Not At Risk (02/19/2024)  Financial Resource Strain: Low Risk  (03/20/2022)   Received from [facility]  Social Connections: Moderately Integrated (02/19/2024)  Stress: No Stress Concern Present (03/20/2022)   Received from [facility]  Tobacco Use: Low Risk  (02/19/2024)     Readmission Risk Interventions     No data to display

## 2024-02-20 NOTE — Care Management Obs Status (Signed)
 MEDICARE OBSERVATION STATUS NOTIFICATION   Patient Details  Name: Monica Anderson MRN: [id number] Date of Birth: 1945/12/19   Medicare Observation Status Notification Given:  Yes    Alfonse JONELLE Rex, RN 02/20/2024, 10:11 AM

## 2024-02-20 NOTE — Progress Notes (Signed)
 Physical Therapy Treatment Patient Details Name: Monica Anderson MRN: [id number] DOB: 11/28/1945 Today's Date: 02/20/2024   History of Present Illness 78 y.o. female admitted 02/19/24 for L TKA. PMH: Meniere's, afib, R TKA 07/2022, HTN, OA.    PT Comments  Pt agreeable to working with therapy. Moderate pain reported-pt rated 6/10. KI worn for ambulation safety 2* L knee still buckling with WBing. Will plan to have a 2nd session prior to potential d/c home later today if pt is meeting PT goals.    If plan is discharge home, recommend the following: A little help with walking and/or transfers;A little help with bathing/dressing/bathroom;Assistance with cooking/housework;Assist for transportation;Help with stairs or ramp for entrance   Can travel by private vehicle        Equipment Recommendations  Rolling walker (2 wheels)    Recommendations for Other Services       Precautions / Restrictions Precautions Precautions: Knee Required Braces or Orthoses: Knee Immobilizer - Left Knee Immobilizer - Left: On when out of bed or walking Restrictions Weight Bearing Restrictions Per Provider Order: No Other Position/Activity Restrictions: WBAT     Mobility  Bed Mobility Overal bed mobility: Needs Assistance Bed Mobility: Supine to Sit     Supine to sit: Modified independent (Device/Increase time), HOB elevated, Used rails          Transfers Overall transfer level: Needs assistance Equipment used: Rolling walker (2 wheels) Transfers: Sit to/from Stand Sit to Stand: Min assist           General transfer comment: Cues for safety, technique, hand/LE placement. x 3. Assist to rise, steady, control descent. Increased time.    Ambulation/Gait Ambulation/Gait assistance: Min assist Gait Distance (Feet): 65 Feet Assistive device: Rolling walker (2 wheels) Gait Pattern/deviations: Step-to pattern       General Gait Details: Cues for safety, technique, proper technique RW use,  sequencing, pacing. Assist to stabilize pt throughout distance. KI worn for ambulation safety-knee still wants to buckle.   Stairs             Wheelchair Mobility     Tilt Bed    Modified Rankin (Stroke Patients Only)       Balance Overall balance assessment: Needs assistance   Sitting balance-Leahy Scale: Good     Standing balance support: Bilateral upper extremity supported, During functional activity, Reliant on assistive device for balance Standing balance-Leahy Scale: Poor                              Communication Communication Communication: No apparent difficulties  Cognition Arousal: Alert Behavior During Therapy: WFL for tasks assessed/performed   PT - Cognitive impairments: No apparent impairments                         Following commands: Intact      Cueing Cueing Techniques: Verbal cues  Exercises Total Joint Exercises Ankle Circles/Pumps: AROM, Both, 10 reps Quad Sets: AROM, Left, 10 reps Short Arc Quad: AROM, Left, 10 reps Heel Slides: AAROM, Left, 10 reps Straight Leg Raises: AAROM, Left, 10 reps, AROM Goniometric ROM: ~10-60 degrees    General Comments        Pertinent Vitals/Pain Pain Assessment Pain Assessment: 0-10 Pain Score: 6  Pain Location: L knee Pain Descriptors / Indicators: Aching, Sore Pain Intervention(s): Limited activity within patient's tolerance, Monitored during session, Repositioned, Ice applied    Home Living  Prior Function            PT Goals (current goals can now be found in the care plan section) Progress towards PT goals: Progressing toward goals    Frequency    7X/week      PT Plan      Co-evaluation              AM-PAC PT 6 Clicks Mobility   Outcome Measure  Help needed turning from your back to your side while in a flat bed without using bedrails?: A Little Help needed moving from lying on your back to sitting on the  side of a flat bed without using bedrails?: A Little Help needed moving to and from a bed to a chair (including a wheelchair)?: A Little Help needed standing up from a chair using your arms (e.g., wheelchair or bedside chair)?: A Little Help needed to walk in hospital room?: A Little Help needed climbing 3-5 steps with a railing? : A Lot 6 Click Score: 17    End of Session Equipment Utilized During Treatment: Gait belt;Left knee immobilizer Activity Tolerance: Patient tolerated treatment well Patient left: in chair;with call bell/phone within reach;with chair alarm set   PT Visit Diagnosis: Muscle weakness (generalized) (M62.81);Difficulty in walking, not elsewhere classified (R26.2);Pain Pain - Right/Left: Left Pain - part of body: Knee     Time: 8894-8862 PT Time Calculation (min) (ACUTE ONLY): 32 min  Charges:    $Gait Training: 8-22 mins $Therapeutic Exercise: 8-22 mins PT General Charges $$ ACUTE PT VISIT: 1 Visit                       Dannial SQUIBB, PT Acute Rehabilitation  Office: [phone]

## 2024-02-20 NOTE — Progress Notes (Signed)
 PT Cancellation Note  Patient Details Name: Monica Anderson MRN: [id number] DOB: 1946-05-13   Cancelled Treatment:    Reason Eval/Treat Not Completed:  Attempted PT tx session-pt politely requested to have pain meds before therapy. Will check back as schedule allows.    Dannial SQUIBB, PT Acute Rehabilitation  Office: [phone]

## 2024-02-20 NOTE — Telephone Encounter (Signed)
 2026 Renewal  PAP: Patient assistance application for Far[employer]  through [employer] ([employer]) has been mailed to pt's home address on file. Provider portion of application will be faxed to provider's office.   Provider portion of application will be faxed to Dr. Delon Contes at [facility] physicians

## 2024-02-20 NOTE — Progress Notes (Signed)
 Physical Therapy Treatment Patient Details Name: Monica Anderson MRN: [id number] DOB: 05-Aug-1945 Today's Date: 02/20/2024   History of Present Illness 78 y.o. female admitted 02/19/24 for L TKA. PMH: Meniere's, afib, R TKA 07/2022, HTN, OA.    PT Comments  2nd session for continued gait training and stair training. KI worn for ambulation safety during session.  Attempted stairs-pt unable to complete task-knee buckling-high fall risk. Unfortunately pt was not able to meet her PT goals on today. Updated RN. Will continue to progress activity as safely able.    If plan is discharge home, recommend the following: A little help with walking and/or transfers;A little help with bathing/dressing/bathroom;Assistance with cooking/housework;Assist for transportation;Help with stairs or ramp for entrance   Can travel by private vehicle        Equipment Recommendations       Recommendations for Other Services       Precautions / Restrictions Precautions Precautions: Fall;Knee Required Braces or Orthoses: Knee Immobilizer - Left Knee Immobilizer - Left: On when out of bed or walking Restrictions Weight Bearing Restrictions Per Provider Order: No Other Position/Activity Restrictions: WBAT     Mobility  Bed Mobility Overal bed mobility: Needs Assistance Bed Mobility: Sit to Supine     Supine to sit: Modified independent (Device/Increase time), HOB elevated, Used rails Sit to supine: Contact guard assist        Transfers Overall transfer level: Needs assistance Equipment used: Rolling walker (2 wheels) Transfers: Sit to/from Stand Sit to Stand: Min assist           General transfer comment: Cues for safety, technique, hand/LE placement. x 3. Assist to rise, steady, control descent. Increased time.    Ambulation/Gait Ambulation/Gait assistance: Min assist Gait Distance (Feet): 50 Feet Assistive device: Rolling walker (2 wheels) Gait Pattern/deviations: Step-to pattern        General Gait Details: Cues for safety, technique, proper technique RW use, sequencing, pacing. Assist to stabilize pt throughout distance. KI worn for ambulation safety-knee still wants to buckle.   Stairs Stairs:  (Attempted stairs-pt unable to complete task-knee buckling-high fall risk)           Wheelchair Mobility     Tilt Bed    Modified Rankin (Stroke Patients Only)       Balance Overall balance assessment: Needs assistance   Sitting balance-Leahy Scale: Good     Standing balance support: Bilateral upper extremity supported, During functional activity, Reliant on assistive device for balance Standing balance-Leahy Scale: Poor                              Communication Communication Communication: No apparent difficulties  Cognition Arousal: Alert Behavior During Therapy: WFL for tasks assessed/performed   PT - Cognitive impairments: No apparent impairments                         Following commands: Intact      Cueing Cueing Techniques: Verbal cues  Exercises Total Joint Exercises Ankle Circles/Pumps: AROM, Both, 10 reps Quad Sets: AROM, Left, 10 reps Short Arc Quad: AROM, Left, 10 reps Heel Slides: AAROM, Left, 10 reps Straight Leg Raises: AAROM, Left, 10 reps, AROM Goniometric ROM: ~10-60 degrees    General Comments        Pertinent Vitals/Pain Pain Assessment Pain Assessment: Faces Pain Score: 6  Faces Pain Scale: Hurts even more Pain Location: L knee Pain Descriptors / Indicators: Aching,  Operative site guarding Pain Intervention(s): Limited activity within patient's tolerance, Monitored during session, Repositioned, Ice applied    Home Living                          Prior Function            PT Goals (current goals can now be found in the care plan section) Progress towards PT goals: Progressing toward goals    Frequency    7X/week      PT Plan      Co-evaluation               AM-PAC PT 6 Clicks Mobility   Outcome Measure  Help needed turning from your back to your side while in a flat bed without using bedrails?: A Little Help needed moving from lying on your back to sitting on the side of a flat bed without using bedrails?: A Little Help needed moving to and from a bed to a chair (including a wheelchair)?: A Little Help needed standing up from a chair using your arms (e.g., wheelchair or bedside chair)?: A Little Help needed to walk in hospital room?: A Little Help needed climbing 3-5 steps with a railing? : Total 6 Click Score: 16    End of Session Equipment Utilized During Treatment: Gait belt;Left knee immobilizer Activity Tolerance: Patient tolerated treatment well Patient left: in chair;with call bell/phone within reach;with chair alarm set   PT Visit Diagnosis: Muscle weakness (generalized) (M62.81);Difficulty in walking, not elsewhere classified (R26.2);Pain Pain - Right/Left: Left Pain - part of body: Knee     Time: 1525-1540 PT Time Calculation (min) (ACUTE ONLY): 15 min  Charges:    $Gait Training: 8-22 mins PT General Charges $$ ACUTE PT VISIT: 1 Visit                        Dannial SQUIBB, PT Acute Rehabilitation  Office: [phone]

## 2024-02-20 NOTE — Plan of Care (Signed)
  Problem: Clinical Measurements: Goal: Will remain free from infection Outcome: Progressing Goal: Respiratory complications will improve Outcome: Progressing Goal: Cardiovascular complication will be avoided Outcome: Progressing   Problem: Nutrition: Goal: Adequate nutrition will be maintained Outcome: Progressing   Problem: Coping: Goal: Level of anxiety will decrease Outcome: Progressing   Problem: Elimination: Goal: Will not experience complications related to bowel motility Outcome: Progressing   Problem: Pain Managment: Goal: General experience of comfort will improve and/or be controlled Outcome: Progressing   Problem: Safety: Goal: Ability to remain free from injury will improve Outcome: Progressing   Problem: Skin Integrity: Goal: Risk for impaired skin integrity will decrease Outcome: Progressing   Problem: Education: Goal: Knowledge of the prescribed therapeutic regimen will improve Outcome: Progressing   Problem: Pain Management: Goal: Pain level will decrease with appropriate interventions Outcome: Progressing

## 2024-02-21 DIAGNOSIS — N182 Chronic kidney disease, stage 2 (mild): Secondary | ICD-10-CM | POA: Diagnosis not present

## 2024-02-21 DIAGNOSIS — Z85820 Personal history of malignant melanoma of skin: Secondary | ICD-10-CM | POA: Diagnosis not present

## 2024-02-21 DIAGNOSIS — I130 Hypertensive heart and chronic kidney disease with heart failure and stage 1 through stage 4 chronic kidney disease, or unspecified chronic kidney disease: Secondary | ICD-10-CM | POA: Diagnosis not present

## 2024-02-21 DIAGNOSIS — M1712 Unilateral primary osteoarthritis, left knee: Secondary | ICD-10-CM | POA: Diagnosis not present

## 2024-02-21 DIAGNOSIS — Z96651 Presence of right artificial knee joint: Secondary | ICD-10-CM | POA: Diagnosis not present

## 2024-02-21 DIAGNOSIS — Z23 Encounter for immunization: Secondary | ICD-10-CM | POA: Diagnosis not present

## 2024-02-21 LAB — CBC
HCT: 38.1 % (ref 36.0–46.0)
Hemoglobin: 12.4 g/dL (ref 12.0–15.0)
MCH: 32.3 pg (ref 26.0–34.0)
MCHC: 32.5 g/dL (ref 30.0–36.0)
MCV: 99.2 fL (ref 80.0–100.0)
Platelets: 153 K/uL (ref 150–400)
RBC: 3.84 MIL/uL — ABNORMAL LOW (ref 3.87–5.11)
RDW: 14.7 % (ref 11.5–15.5)
WBC: 12.2 K/uL — ABNORMAL HIGH (ref 4.0–10.5)
nRBC: 0 % (ref 0.0–0.2)

## 2024-02-21 NOTE — Progress Notes (Signed)
Discharge package printed and instructions given to pt. Pt verbalizes understanding. 

## 2024-02-21 NOTE — Plan of Care (Signed)
   Problem: Coping: Goal: Level of anxiety will decrease Outcome: Progressing   Problem: Pain Managment: Goal: General experience of comfort will improve and/or be controlled Outcome: Progressing   Problem: Safety: Goal: Ability to remain free from injury will improve Outcome: Progressing

## 2024-02-21 NOTE — Progress Notes (Signed)
   Subjective: 2 Days Post-Op Procedure(s) (LRB): ARTHROPLASTY, KNEE, TOTAL (Left) Patient reports pain as mild.   Patient seen in rounds for Dr. Melodi. Patient is well, and has had no acute complaints or problems Plan is to go Home after hospital stay.  Objective: Vital signs in last 24 hours: Temp:  [98.2 F (36.8 C)-98.3 F (36.8 C)] 98.3 F (36.8 C) (09/17 0646) Pulse Rate:  [59-66] 59 (09/17 0646) Resp:  [16-18] 18 (09/17 0646) BP: (136-157)/(60-76) 141/76 (09/17 0646) SpO2:  [94 %-95 %] 94 % (09/17 0646)  Intake/Output from previous day:  Intake/Output Summary (Last 24 hours) at 02/21/2024 0814 Last data filed at 02/21/2024 0723 Gross per 24 hour  Intake 1525 ml  Output 3275 ml  Net -1750 ml    Intake/Output this shift: Total I/O In: -  Out: 300 [Urine:300]  Labs: Recent Labs    02/20/24 0335 02/21/24 0334  HGB 12.5 12.4   Recent Labs    02/20/24 0335 02/21/24 0334  WBC 10.2 12.2*  RBC 4.04 3.84*  HCT 39.8 38.1  PLT 156 153   Recent Labs    02/20/24 0335  NA 139  K 3.7  CL 107  CO2 22  BUN 12  CREATININE 0.86  GLUCOSE 175*  CALCIUM  8.7*   No results for input(s): LABPT, INR in the last 72 hours.  Exam: General - Patient is Alert and Oriented Extremity - Neurologically intact Neurovascular intact Sensation intact distally Dorsiflexion/Plantar flexion intact Dressing/Incision - clean, dry, no drainage Motor Function - intact, moving foot and toes well on exam.   Past Medical History:  Diagnosis Date   Anemia    Arthritis    Asymmetrical left sensorineural hearing loss 10/01/2019   Atrial fibrillation (HCC) 10/02/2019   CHF (congestive heart failure) (HCC)    hypertrophic CM,   Chronic kidney disease    CKD2   Dysrhythmia    A. fib  had ablation 11-17-23   Dysrhythmia    NSVT   Essential hypertension 10/03/2019   History of colon polyps    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, left (HCC) 01/17/2017    Meniere's disease 08/29/2019   with hearing loss in Lt ear   Mixed hyperlipidemia 10/03/2019   Multiple dysplastic nevi    Myocardial infarction (HCC)    Other hypertrophic cardiomyopathy (HCC)    Pleural effusion    Pneumonia    Pre-diabetes    Squamous cell carcinoma of head and neck    Moh's surgery    Assessment/Plan: 2 Days Post-Op Procedure(s) (LRB): ARTHROPLASTY, KNEE, TOTAL (Left) Principal Problem:   Osteoarthritis of left knee Active Problems:   Primary osteoarthritis of left knee  Estimated body mass index is 33.04 kg/m as calculated from the following:   Height as of this encounter: 5' 7 (1.702 m).   Weight as of this encounter: 95.7 kg. Up with therapy  DVT Prophylaxis - Eliquis  Weight-bearing as tolerated  Did not pass stair training yesterday, should be ready to go today once cleared.  Roxie Mess, PA-C Orthopedic Surgery [phone] 02/21/2024, 8:14 AM

## 2024-02-21 NOTE — Progress Notes (Signed)
 Physical Therapy Treatment Patient Details Name: Monica Anderson MRN: [id number] DOB: [birth year] Today's Date: 02/21/2024   History of Present Illness 78 y.o. female admitted 02/19/24 for L TKA. PMH: Meniere's, afib, R TKA 07/2022, HTN, OA.    PT Comments  Pt is progressing well with mobility. She ambulated 85' with RW and L KI with just 1 episode of minimal buckling, no loss of balance. Pt was able to ascend/descend 2 stairs using backwards technique. Reviewed TKA HEP, pt demonstrates good understanding. Will return this afternoon to review stair training with pt's spouse present, then pt will be ready to DC home from a PT standpoint.    If plan is discharge home, recommend the following: A little help with walking and/or transfers;A little help with bathing/dressing/bathroom;Assistance with cooking/housework;Assist for transportation;Help with stairs or ramp for entrance   Can travel by private vehicle        Equipment Recommendations  Rolling walker (2 wheels)    Recommendations for Other Services       Precautions / Restrictions Precautions Precautions: Fall;Knee Precaution Booklet Issued: Yes (comment) Recall of Precautions/Restrictions: Intact Precaution/Restrictions Comments: reviewed no pillow under knee Required Braces or Orthoses: Knee Immobilizer - Left Knee Immobilizer - Left: On when out of bed or walking Restrictions Weight Bearing Restrictions Per Provider Order: No Other Position/Activity Restrictions: WBAT     Mobility  Bed Mobility Overal bed mobility: Modified Independent Bed Mobility: Supine to Sit     Supine to sit: Modified independent (Device/Increase time), HOB elevated, Used rails          Transfers Overall transfer level: Needs assistance Equipment used: Rolling walker (2 wheels) Transfers: Sit to/from Stand Sit to Stand: Supervision           General transfer comment: Cues for safety, technique, hand/LE placement. x 3     Ambulation/Gait Ambulation/Gait assistance: Contact guard assist Gait Distance (Feet): 70 Feet Assistive device: Rolling walker (2 wheels) Gait Pattern/deviations: Step-to pattern Gait velocity: decr     General Gait Details: steady, no loss of balance, pt reported only 1 instance of L knee buckling, ambulated with L KI. VCs to lift head.   Stairs Stairs: Yes Stairs assistance: Min assist Stair Management: No rails, Backwards, Step to pattern, With walker Number of Stairs: 2 General stair comments: VCs sequencing, min A to steady RW; KI on LLE, no buckling noted   Wheelchair Mobility     Tilt Bed    Modified Rankin (Stroke Patients Only)       Balance Overall balance assessment: Needs assistance Sitting-balance support: No upper extremity supported, Feet supported Sitting balance-Leahy Scale: Good     Standing balance support: Bilateral upper extremity supported, During functional activity, Reliant on assistive device for balance Standing balance-Leahy Scale: Poor                              Communication Communication Communication: No apparent difficulties  Cognition Arousal: Alert Behavior During Therapy: WFL for tasks assessed/performed   PT - Cognitive impairments: No apparent impairments                         Following commands: Intact      Cueing Cueing Techniques: Verbal cues  Exercises Total Joint Exercises Ankle Circles/Pumps: AROM, Both, 10 reps Quad Sets: AROM, Left, 5 reps Short Arc Quad: AROM, Left, 5 reps Heel Slides: AAROM, Left, 5 reps Hip ABduction/ADduction: AROM, Left, 5  reps, Supine Straight Leg Raises: Left, AROM, 5 reps Long Arc Quad: AROM, Left, 5 reps, Seated Knee Flexion: AAROM, Left, 5 reps, Seated Goniometric ROM: ~5-65* AAROM L knee    General Comments        Pertinent Vitals/Pain Pain Assessment Pain Score: 7  Pain Location: L knee Pain Descriptors / Indicators: Aching, Operative site  guarding Pain Intervention(s): Limited activity within patient's tolerance, Monitored during session, Premedicated before session, Ice applied    Home Living                          Prior Function            PT Goals (current goals can now be found in the care plan section) Acute Rehab PT Goals Patient Stated Goal: go fishing at beach PT Goal Formulation: With patient Time For Goal Achievement: 03/04/24 Potential to Achieve Goals: Good Progress towards PT goals: Progressing toward goals    Frequency    7X/week      PT Plan      Co-evaluation              AM-PAC PT 6 Clicks Mobility   Outcome Measure  Help needed turning from your back to your side while in a flat bed without using bedrails?: None Help needed moving from lying on your back to sitting on the side of a flat bed without using bedrails?: A Little Help needed moving to and from a bed to a chair (including a wheelchair)?: None Help needed standing up from a chair using your arms (e.g., wheelchair or bedside chair)?: None Help needed to walk in hospital room?: None Help needed climbing 3-5 steps with a railing? : A Little 6 Click Score: 22    End of Session Equipment Utilized During Treatment: Gait belt;Left knee immobilizer Activity Tolerance: Patient tolerated treatment well Patient left: in chair;with call bell/phone within reach;with chair alarm set Nurse Communication: Mobility status PT Visit Diagnosis: Muscle weakness (generalized) (M62.81);Difficulty in walking, not elsewhere classified (R26.2);Pain Pain - Right/Left: Left Pain - part of body: Knee     Time: 8985-8943 PT Time Calculation (min) (ACUTE ONLY): 42 min  Charges:    $Gait Training: 8-22 mins $Therapeutic Exercise: 8-22 mins $Therapeutic Activity: 8-22 mins PT General Charges $$ ACUTE PT VISIT: 1 Visit                     Sylvan Nest Kistler PT 02/21/2024  Acute Rehabilitation Services  Office  [phone]

## 2024-02-21 NOTE — Plan of Care (Signed)
   Problem: Activity: Goal: Risk for activity intolerance will decrease Outcome: Progressing   Problem: Pain Managment: Goal: General experience of comfort will improve and/or be controlled Outcome: Progressing   Problem: Safety: Goal: Ability to remain free from injury will improve Outcome: Progressing

## 2024-02-21 NOTE — Progress Notes (Signed)
 Physical Therapy Treatment Patient Details Name: Monica Anderson MRN: [id number] DOB: 02/06/1946 Today's Date: 02/21/2024   History of Present Illness 78 y.o. female admitted 02/19/24 for L TKA. PMH: Meniere's, afib, R TKA 07/2022, HTN, OA.    PT Comments  Pt's spouse not present for stair training as planned, he had not arrived 2 hours after planned meeting time, pt stated spouse is lost trying to get here. Reviewed stair training with pt, handout issued with written/pictoral instructions for stairs. Pt reports she feels comfortable with stair technique. Pt denied buckling of LLE with ambulation and with stairs. She is ready to DC home from a PT standpoint.     If plan is discharge home, recommend the following: A little help with walking and/or transfers;A little help with bathing/dressing/bathroom;Assistance with cooking/housework;Assist for transportation;Help with stairs or ramp for entrance   Can travel by private vehicle        Equipment Recommendations  Rolling walker (2 wheels)    Recommendations for Other Services       Precautions / Restrictions Precautions Precautions: Fall;Knee Precaution Booklet Issued: Yes (comment) Recall of Precautions/Restrictions: Intact Precaution/Restrictions Comments: reviewed no pillow under knee Required Braces or Orthoses: Knee Immobilizer - Left Knee Immobilizer - Left: On when out of bed or walking Restrictions Weight Bearing Restrictions Per Provider Order: No LLE Weight Bearing Per Provider Order: Weight bearing as tolerated Other Position/Activity Restrictions: WBAT     Mobility  Bed Mobility Overal bed mobility: Modified Independent Bed Mobility: Supine to Sit     Supine to sit: Modified independent (Device/Increase time), HOB elevated, Used rails     General bed mobility comments: up in recliner    Transfers Overall transfer level: Needs assistance Equipment used: Rolling walker (2 wheels) Transfers: Sit to/from  Stand Sit to Stand: Supervision           General transfer comment: VCs for hand placement    Ambulation/Gait Ambulation/Gait assistance: Modified independent (Device/Increase time) Gait Distance (Feet): 55 Feet Assistive device: Rolling walker (2 wheels) Gait Pattern/deviations: Step-to pattern Gait velocity: decr     General Gait Details: steady, no loss of balance, pt reported no buckling of LLE, ambulated with L KI. VCs to lift head.   Stairs Stairs: Yes Stairs assistance: Min assist Stair Management: No rails, Backwards, Step to pattern, With walker Number of Stairs: 2 General stair comments: VCs sequencing, min A to steady RW; KI on LLE, no buckling noted   Wheelchair Mobility     Tilt Bed    Modified Rankin (Stroke Patients Only)       Balance Overall balance assessment: Needs assistance Sitting-balance support: No upper extremity supported, Feet supported Sitting balance-Leahy Scale: Good     Standing balance support: Bilateral upper extremity supported, During functional activity, Reliant on assistive device for balance Standing balance-Leahy Scale: Poor                              Communication Communication Communication: No apparent difficulties  Cognition Arousal: Alert Behavior During Therapy: WFL for tasks assessed/performed   PT - Cognitive impairments: No apparent impairments                         Following commands: Intact      Cueing Cueing Techniques: Verbal cues  Exercises Total Joint Exercises  Heel Slides: AAROM, Left, 10 reps  Goniometric ROM: ~5-65* AAROM L knee    General  Comments        Pertinent Vitals/Pain Pain Assessment Pain Score: 7  Pain Location: L knee Pain Descriptors / Indicators: Aching, Operative site guarding Pain Intervention(s): Limited activity within patient's tolerance, Monitored during session, Premedicated before session, Ice applied    Home Living                           Prior Function            PT Goals (current goals can now be found in the care plan section) Acute Rehab PT Goals Patient Stated Goal: go fishing at beach PT Goal Formulation: With patient Time For Goal Achievement: 03/04/24 Potential to Achieve Goals: Good Progress towards PT goals: Progressing toward goals    Frequency    7X/week      PT Plan      Co-evaluation              AM-PAC PT 6 Clicks Mobility   Outcome Measure  Help needed turning from your back to your side while in a flat bed without using bedrails?: None Help needed moving from lying on your back to sitting on the side of a flat bed without using bedrails?: A Little Help needed moving to and from a bed to a chair (including a wheelchair)?: None Help needed standing up from a chair using your arms (e.g., wheelchair or bedside chair)?: None Help needed to walk in hospital room?: None Help needed climbing 3-5 steps with a railing? : A Little 6 Click Score: 22    End of Session Equipment Utilized During Treatment: Gait belt;Left knee immobilizer Activity Tolerance: Patient tolerated treatment well Patient left: in chair;with call bell/phone within reach;with chair alarm set Nurse Communication: Mobility status PT Visit Diagnosis: Muscle weakness (generalized) (M62.81);Difficulty in walking, not elsewhere classified (R26.2);Pain Pain - Right/Left: Left Pain - part of body: Knee     Time: 8487-8475 PT Time Calculation (min) (ACUTE ONLY): 12 min  Charges:    $Gait Training: 8-22 mins PT General Charges $$ ACUTE PT VISIT: 1 Visit                     Sylvan Nest Kistler PT 02/21/2024  Acute Rehabilitation Services  Office [phone]

## 2024-02-22 DIAGNOSIS — R2689 Other abnormalities of gait and mobility: Secondary | ICD-10-CM | POA: Diagnosis not present

## 2024-02-22 DIAGNOSIS — Z96652 Presence of left artificial knee joint: Secondary | ICD-10-CM | POA: Diagnosis not present

## 2024-02-22 DIAGNOSIS — M6281 Muscle weakness (generalized): Secondary | ICD-10-CM | POA: Diagnosis not present

## 2024-02-22 DIAGNOSIS — M25562 Pain in left knee: Secondary | ICD-10-CM | POA: Diagnosis not present

## 2024-02-23 ENCOUNTER — Telehealth: Payer: Self-pay | Admitting: Cardiology

## 2024-02-23 NOTE — Telephone Encounter (Signed)
   Pre-operative Risk Assessment    Patient Name: Monica Anderson  DOB: March 22, 1946 MRN: [id number]      Request for Surgical Clearance    Procedure:  Inttravitreal Botox  Date of Surgery:  Clearance 03/19/24                                 Surgeon:  Dr Gaston Surgeon's Group or Practice Name:  [facility] Phone number:  [phone] Fax number:  [phone]   Type of Clearance Requested:   - Pharmacy:  Hold Apixaban  (Eliquis ) 2-3 days before   Type of Anesthesia:  Local    Additional requests/questions:    Bonney Rojelio Kays   02/23/2024, 3:27 PM

## 2024-02-26 DIAGNOSIS — M25562 Pain in left knee: Secondary | ICD-10-CM | POA: Diagnosis not present

## 2024-02-26 DIAGNOSIS — M6281 Muscle weakness (generalized): Secondary | ICD-10-CM | POA: Diagnosis not present

## 2024-02-26 DIAGNOSIS — Z96652 Presence of left artificial knee joint: Secondary | ICD-10-CM | POA: Diagnosis not present

## 2024-02-26 DIAGNOSIS — R2689 Other abnormalities of gait and mobility: Secondary | ICD-10-CM | POA: Diagnosis not present

## 2024-02-26 NOTE — Discharge Summary (Signed)
 Patient ID: Shayden Gingrich MRN: [id number] DOB/AGE: 79-Dec-1947 79 y.o.  Admit date: 02/19/2024 Discharge date: 02/21/2024  Admission Diagnoses:  Principal Problem:   Osteoarthritis of left knee Active Problems:   Primary osteoarthritis of left knee   Discharge Diagnoses:  Same  Past Medical History:  Diagnosis Date   Anemia    Arthritis    Asymmetrical left sensorineural hearing loss 10/01/2019   Atrial fibrillation (HCC) 10/02/2019   CHF (congestive heart failure) (HCC)    hypertrophic CM,   Chronic kidney disease    CKD2   Dysrhythmia    A. fib  had ablation 11-17-23   Dysrhythmia    NSVT   Essential hypertension 10/03/2019   History of colon polyps    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, left (HCC) 01/17/2017   Meniere's disease 08/29/2019   with hearing loss in Lt ear   Mixed hyperlipidemia 10/03/2019   Multiple dysplastic nevi    Myocardial infarction (HCC)    Other hypertrophic cardiomyopathy (HCC)    Pleural effusion    Pneumonia    Pre-diabetes    Squamous cell carcinoma of head and neck    Moh's surgery    Surgeries: Procedure(s): ARTHROPLASTY, KNEE, TOTAL on 02/19/2024   Consultants:   Discharged Condition: Improved  Hospital Course: Mariabelen Pressly is an 78 y.o. female who was admitted 02/19/2024 for operative treatment ofOsteoarthritis of left knee. Patient has severe unremitting pain that affects sleep, daily activities, and work/hobbies. After pre-op clearance the patient was taken to the operating room on 02/19/2024 and underwent  Procedure(s): ARTHROPLASTY, KNEE, TOTAL.    Patient was given perioperative antibiotics:  Anti-infectives (From admission, onward)    Start     Dose/Rate Route Frequency Ordered Stop   02/20/24 1000  nitrofurantoin  (macrocrystal-monohydrate) (MACROBID ) capsule 100 mg  Status:  Discontinued        100 mg Oral Daily 02/19/24 1117 02/21/24 2054   02/19/24 1430  ceFAZolin  (ANCEF ) IVPB 2g/100 mL  premix        2 g 200 mL/hr over 30 Minutes Intravenous Every 6 hours 02/19/24 1117 02/20/24 1041   02/19/24 0600  ceFAZolin  (ANCEF ) IVPB 2g/100 mL premix        2 g 200 mL/hr over 30 Minutes Intravenous On call to O.R. 02/19/24 0550 02/19/24 0843        Patient was given sequential compression devices, early ambulation, and chemoprophylaxis to prevent DVT.  Patient benefited maximally from hospital stay and there were no complications.    Recent vital signs: No data found.   Recent laboratory studies: No results for input(s): WBC, HGB, HCT, PLT, NA, K, CL, CO2, BUN, CREATININE, GLUCOSE, INR, CALCIUM  in the last 72 hours.  Invalid input(s): PT, 2   Discharge Medications:   Allergies as of 02/21/2024   No Known Allergies      Medication List     TAKE these medications    buPROPion  150 MG 24 hr tablet Commonly known as: WELLBUTRIN  XL Take 150 mg by mouth in the morning.   CALCIUM  600+D3 PO Take 1 tablet by mouth in the morning.   carvedilol  6.25 MG tablet Commonly known as: COREG  Take 1 tablet (6.25 mg total) by mouth 2 (two) times daily.   diltiazem  240 MG 24 hr capsule Commonly known as: CARDIZEM  CD Take 1 capsule (240 mg total) by mouth daily.   Eliquis  5 MG Tabs tablet Generic drug: apixaban  Take 5 mg by mouth 2 (two) times daily.  estradiol 0.1 MG/GM vaginal cream Commonly known as: ESTRACE Place 1 Applicatorful vaginally daily as needed (Pt prefrence).   famotidine  20 MG tablet Commonly known as: PEPCID  Take 20 mg by mouth at bedtime.   Farxiga  10 MG Tabs tablet Generic drug: dapagliflozin  propanediol Take 10 mg by mouth daily.   ketoconazole 2 % cream Commonly known as: NIZORAL Apply 1 Application topically 2 (two) times daily as needed (irritation.).   methocarbamol  500 MG tablet Commonly known as: ROBAXIN  Take 1 tablet (500 mg total) by mouth every 6 (six) hours as needed for muscle spasms.   montelukast  10 MG  tablet Commonly known as: SINGULAIR  Take 10 mg by mouth at bedtime.   nitrofurantoin  (macrocrystal-monohydrate) 100 MG capsule Commonly known as: MACROBID  Take 100 mg by mouth daily.   Omega-3 Fish Oil 1200 MG Caps Take 1,200 mg by mouth every evening.   ondansetron  4 MG tablet Commonly known as: ZOFRAN  Take 1 tablet (4 mg total) by mouth every 6 (six) hours as needed for nausea.   oxyCODONE  5 MG immediate release tablet Commonly known as: Oxy IR/ROXICODONE  Take 1-2 tablets (5-10 mg total) by mouth every 6 (six) hours as needed for severe pain (pain score 7-10).   PRESERVISION AREDS 2 PO Take 1 tablet by mouth in the morning.   rosuvastatin  20 MG tablet Commonly known as: CRESTOR  Take 1 tablet (20 mg total) by mouth every evening.   solifenacin 5 MG tablet Commonly known as: VESICARE Take 5 mg by mouth daily.   spironolactone -hydrochlorothiazide  25-25 MG tablet Commonly known as: ALDACTAZIDE Take 0.5 tablets by mouth in the morning.       ASK your doctor about these medications    traMADol  50 MG tablet Commonly known as: ULTRAM  Take 1-2 tablets (50-100 mg total) by mouth every 6 (six) hours as needed for up to 5 days for moderate pain (pain score 4-6). Ask about: Should I take this medication?               Discharge Care Instructions  (From admission, onward)           Start     Ordered   02/20/24 0000  Weight bearing as tolerated        02/20/24 0809   02/20/24 0000  Change dressing       Comments: You may remove the bulky bandage (ACE wrap and gauze) two days after surgery. You will have an adhesive waterproof bandage underneath. Leave this in place until your first follow-up appointment.   02/20/24 0809            Diagnostic Studies: No results found.  Disposition: Discharge disposition: 01-Home or Self Care       Discharge Instructions     Call MD / Call 911   Complete by: As directed    If you experience chest pain or  shortness of breath, CALL 911 and be transported to the hospital emergency room.  If you develope a fever above 101 F, pus (white drainage) or increased drainage or redness at the wound, or calf pain, call your surgeon's office.   Change dressing   Complete by: As directed    You may remove the bulky bandage (ACE wrap and gauze) two days after surgery. You will have an adhesive waterproof bandage underneath. Leave this in place until your first follow-up appointment.   Constipation Prevention   Complete by: As directed    Drink plenty of fluids.  Prune juice may be helpful.  You may use a stool softener, such as Colace (over the counter) 100 mg twice a day.  Use MiraLax  (over the counter) for constipation as needed.   Diet - low sodium heart healthy   Complete by: As directed    Do not put a pillow under the knee. Place it under the heel.   Complete by: As directed    Driving restrictions   Complete by: As directed    No driving for two weeks   Post-operative opioid taper instructions:   Complete by: As directed    POST-OPERATIVE OPIOID TAPER INSTRUCTIONS: It is important to wean off of your opioid medication as soon as possible. If you do not need pain medication after your surgery it is ok to stop day one. Opioids include: Codeine, Hydrocodone(Norco, Vicodin), Oxycodone (Percocet, oxycontin ) and hydromorphone  amongst others.  Long term and even short term use of opiods can cause: Increased pain response Dependence Constipation Depression Respiratory depression And more.  Withdrawal symptoms can include Flu like symptoms Nausea, vomiting And more Techniques to manage these symptoms Hydrate well Eat regular healthy meals Stay active Use relaxation techniques(deep breathing, meditating, yoga) Do Not substitute Alcohol to help with tapering If you have been on opioids for less than two weeks and do not have pain than it is ok to stop all together.  Plan to wean off of opioids This  plan should start within one week post op of your joint replacement. Maintain the same interval or time between taking each dose and first decrease the dose.  Cut the total daily intake of opioids by one tablet each day Next start to increase the time between doses. The last dose that should be eliminated is the evening dose.      TED hose   Complete by: As directed    Use stockings (TED hose) for three weeks on both leg(s).  You may remove them at night for sleeping.   Weight bearing as tolerated   Complete by: As directed         Follow-up Information     Melodi Lerner, MD. Go on 03/05/2024.   Specialty: Orthopedic Surgery Why: You are scheduled for a post op appointment on Tuesday 03/05/24 at 1:30pm Contact information: [address] [place] [address] [place] [address] [phone]                  Signed: Roxie Mess 02/26/2024, 10:43 AM

## 2024-02-28 NOTE — Telephone Encounter (Signed)
   Patient Name: Monica Anderson  DOB: 05-Aug-1945 MRN: [id number]  Primary Cardiologist: Kardie Tobb, DO  Clinical pharmacists have reviewed the patient's past medical history, labs, and current medications as part of preoperative protocol coverage. The following recommendations have been made:  Patient with diagnosis of Afib on Eliquis  for anticoagulation.     Procedure: Inttravitreal Botox  Date of procedure: 03/19/24      CHA2DS2-VASc Score = 6   This indicates a 9.7% annual risk of stroke. The patient's score is based upon: CHF History: 1 HTN History: 1 Diabetes History: 0 Stroke History: 0 Vascular Disease History: 1 Age Score: 2 Gender Score: 1     CrCl 64 mL/min Platelet count 153 K    Patient has not  had an Afib/aflutter ablation or Watchman within the last 3 months or DCCV within the last 30 days      Per office protocol, patient can hold Eliquis  for 2-3 days prior to procedure.     I will route this recommendation to the requesting party via Epic fax function and remove from pre-op pool.  Please call with questions.  Lamarr Satterfield, NP 02/28/2024, 3:07 PM

## 2024-02-28 NOTE — Telephone Encounter (Signed)
 Patient with diagnosis of Afib on Eliquis  for anticoagulation.    Procedure: Inttravitreal Botox  Date of procedure: 03/19/24    CHA2DS2-VASc Score = 6   This indicates a 9.7% annual risk of stroke. The patient's score is based upon: CHF History: 1 HTN History: 1 Diabetes History: 0 Stroke History: 0 Vascular Disease History: 1 Age Score: 2 Gender Score: 1     CrCl 64 mL/min Platelet count 153 K   Patient has not  had an Afib/aflutter ablation or Watchman within the last 3 months or DCCV within the last 30 days    Per office protocol, patient can hold Eliquis  for 2-3 days prior to procedure.     **This guidance is not considered finalized until pre-operative APP has relayed final recommendations.**

## 2024-02-29 DIAGNOSIS — Z96652 Presence of left artificial knee joint: Secondary | ICD-10-CM | POA: Diagnosis not present

## 2024-02-29 DIAGNOSIS — R2689 Other abnormalities of gait and mobility: Secondary | ICD-10-CM | POA: Diagnosis not present

## 2024-02-29 DIAGNOSIS — M6281 Muscle weakness (generalized): Secondary | ICD-10-CM | POA: Diagnosis not present

## 2024-02-29 DIAGNOSIS — M25562 Pain in left knee: Secondary | ICD-10-CM | POA: Diagnosis not present

## 2024-03-04 DIAGNOSIS — R2689 Other abnormalities of gait and mobility: Secondary | ICD-10-CM | POA: Diagnosis not present

## 2024-03-04 DIAGNOSIS — M6281 Muscle weakness (generalized): Secondary | ICD-10-CM | POA: Diagnosis not present

## 2024-03-04 DIAGNOSIS — M25562 Pain in left knee: Secondary | ICD-10-CM | POA: Diagnosis not present

## 2024-03-04 DIAGNOSIS — Z96652 Presence of left artificial knee joint: Secondary | ICD-10-CM | POA: Diagnosis not present

## 2024-03-05 DIAGNOSIS — R350 Frequency of micturition: Secondary | ICD-10-CM | POA: Diagnosis not present

## 2024-03-07 DIAGNOSIS — M25562 Pain in left knee: Secondary | ICD-10-CM | POA: Diagnosis not present

## 2024-03-07 DIAGNOSIS — Z96652 Presence of left artificial knee joint: Secondary | ICD-10-CM | POA: Diagnosis not present

## 2024-03-07 DIAGNOSIS — M6281 Muscle weakness (generalized): Secondary | ICD-10-CM | POA: Diagnosis not present

## 2024-03-07 DIAGNOSIS — R2689 Other abnormalities of gait and mobility: Secondary | ICD-10-CM | POA: Diagnosis not present

## 2024-03-11 DIAGNOSIS — M6281 Muscle weakness (generalized): Secondary | ICD-10-CM | POA: Diagnosis not present

## 2024-03-11 DIAGNOSIS — R2689 Other abnormalities of gait and mobility: Secondary | ICD-10-CM | POA: Diagnosis not present

## 2024-03-11 DIAGNOSIS — M25562 Pain in left knee: Secondary | ICD-10-CM | POA: Diagnosis not present

## 2024-03-11 DIAGNOSIS — Z96652 Presence of left artificial knee joint: Secondary | ICD-10-CM | POA: Diagnosis not present

## 2024-03-14 DIAGNOSIS — Z96652 Presence of left artificial knee joint: Secondary | ICD-10-CM | POA: Diagnosis not present

## 2024-03-14 DIAGNOSIS — M6281 Muscle weakness (generalized): Secondary | ICD-10-CM | POA: Diagnosis not present

## 2024-03-14 DIAGNOSIS — R2689 Other abnormalities of gait and mobility: Secondary | ICD-10-CM | POA: Diagnosis not present

## 2024-03-14 DIAGNOSIS — M25562 Pain in left knee: Secondary | ICD-10-CM | POA: Diagnosis not present

## 2024-03-18 DIAGNOSIS — Z96652 Presence of left artificial knee joint: Secondary | ICD-10-CM | POA: Diagnosis not present

## 2024-03-18 DIAGNOSIS — M25562 Pain in left knee: Secondary | ICD-10-CM | POA: Diagnosis not present

## 2024-03-18 DIAGNOSIS — R2689 Other abnormalities of gait and mobility: Secondary | ICD-10-CM | POA: Diagnosis not present

## 2024-03-18 DIAGNOSIS — M6281 Muscle weakness (generalized): Secondary | ICD-10-CM | POA: Diagnosis not present

## 2024-03-18 NOTE — Telephone Encounter (Signed)
 Received provider portion of patient assistance application

## 2024-03-19 DIAGNOSIS — R351 Nocturia: Secondary | ICD-10-CM | POA: Diagnosis not present

## 2024-03-19 DIAGNOSIS — R350 Frequency of micturition: Secondary | ICD-10-CM | POA: Diagnosis not present

## 2024-03-19 DIAGNOSIS — N3946 Mixed incontinence: Secondary | ICD-10-CM | POA: Diagnosis not present

## 2024-03-21 DIAGNOSIS — M6281 Muscle weakness (generalized): Secondary | ICD-10-CM | POA: Diagnosis not present

## 2024-03-21 DIAGNOSIS — M25562 Pain in left knee: Secondary | ICD-10-CM | POA: Diagnosis not present

## 2024-03-21 DIAGNOSIS — R2689 Other abnormalities of gait and mobility: Secondary | ICD-10-CM | POA: Diagnosis not present

## 2024-03-21 DIAGNOSIS — Z96652 Presence of left artificial knee joint: Secondary | ICD-10-CM | POA: Diagnosis not present

## 2024-03-21 NOTE — Telephone Encounter (Signed)
 Spoke with patient regarding patient assistance application mailed 9/16.  Patient did receive application and will mail or bring by office next week,

## 2024-03-25 DIAGNOSIS — D0462 Carcinoma in situ of skin of left upper limb, including shoulder: Secondary | ICD-10-CM | POA: Diagnosis not present

## 2024-03-25 DIAGNOSIS — C44712 Basal cell carcinoma of skin of right lower limb, including hip: Secondary | ICD-10-CM | POA: Diagnosis not present

## 2024-03-25 DIAGNOSIS — D485 Neoplasm of uncertain behavior of skin: Secondary | ICD-10-CM | POA: Diagnosis not present

## 2024-03-25 NOTE — Telephone Encounter (Signed)
 PAP: Application for Marcelline Deist has been submitted to [employer] ([employer]), via fax

## 2024-03-26 DIAGNOSIS — M25562 Pain in left knee: Secondary | ICD-10-CM | POA: Diagnosis not present

## 2024-03-26 DIAGNOSIS — R2689 Other abnormalities of gait and mobility: Secondary | ICD-10-CM | POA: Diagnosis not present

## 2024-03-26 DIAGNOSIS — Z96652 Presence of left artificial knee joint: Secondary | ICD-10-CM | POA: Diagnosis not present

## 2024-03-26 DIAGNOSIS — M6281 Muscle weakness (generalized): Secondary | ICD-10-CM | POA: Diagnosis not present

## 2024-03-27 NOTE — Telephone Encounter (Signed)
 PAP: Patient assistance application for Farxiga  has been approved by PAP Companies: [employer] from 06/06/2024 to 12/31/20026. Medication should be delivered to PAP Delivery: Home. For further shipping updates, please contact [employer] ([employer]) at [phone]. Patient ID is: [id number]

## 2024-03-27 NOTE — Progress Notes (Signed)
 Pharmacy Medication Assistance Program Note    03/27/2024  Patient ID: Arlisha Patalano, female   DOB: [birth year], 78 y.o.   MRN: [id number]     06/19/2023 10/26/2023 02/20/2024  Outreach Medication One  Initial Outreach Date (Medication One) 06/15/2023 10/26/2023 02/20/2024  Manufacturer Medication One [employer]  [employer] [facility] Farxiga  Farxiga  Farxiga   Dose of Farxiga  5mg  10mg  5mg   Type of Sport and exercise psychologist Assistance  Date Application Sent to Patient 06/15/2023  02/22/2024  Application Items Requested Application;Proof of Income;Other  Application  Date Application Sent to Prescriber 06/15/2023  03/07/2024  Name of Prescriber Delon Burgart [facility] Burgart Delon Burgart  Date Application Received From Patient  10/26/2023 03/25/2024  Application Items Received From Patient  Application   Date Application Received From Provider  10/26/2023 03/18/2024  Date Application Submitted to Manufacturer  10/26/2023 03/25/2024  Method Application Sent to Manufacturer   Fax  Patient Assistance Determination  Approved Approved  Approval Start Date  10/27/2023 06/06/2024  Approval End Date  06/05/2024 06/05/2025  Patient Notification Method  MyChart Telephone Call     Signature

## 2024-03-28 DIAGNOSIS — M25562 Pain in left knee: Secondary | ICD-10-CM | POA: Diagnosis not present

## 2024-03-28 DIAGNOSIS — M6281 Muscle weakness (generalized): Secondary | ICD-10-CM | POA: Diagnosis not present

## 2024-03-28 DIAGNOSIS — Z96652 Presence of left artificial knee joint: Secondary | ICD-10-CM | POA: Diagnosis not present

## 2024-03-28 DIAGNOSIS — R2689 Other abnormalities of gait and mobility: Secondary | ICD-10-CM | POA: Diagnosis not present

## 2024-03-29 DIAGNOSIS — Z5189 Encounter for other specified aftercare: Secondary | ICD-10-CM | POA: Diagnosis not present

## 2024-04-01 DIAGNOSIS — Z96652 Presence of left artificial knee joint: Secondary | ICD-10-CM | POA: Diagnosis not present

## 2024-04-01 DIAGNOSIS — M25562 Pain in left knee: Secondary | ICD-10-CM | POA: Diagnosis not present

## 2024-04-01 DIAGNOSIS — R2689 Other abnormalities of gait and mobility: Secondary | ICD-10-CM | POA: Diagnosis not present

## 2024-04-01 DIAGNOSIS — M6281 Muscle weakness (generalized): Secondary | ICD-10-CM | POA: Diagnosis not present

## 2024-04-04 DIAGNOSIS — Z96652 Presence of left artificial knee joint: Secondary | ICD-10-CM | POA: Diagnosis not present

## 2024-04-04 DIAGNOSIS — R2689 Other abnormalities of gait and mobility: Secondary | ICD-10-CM | POA: Diagnosis not present

## 2024-04-04 DIAGNOSIS — M25562 Pain in left knee: Secondary | ICD-10-CM | POA: Diagnosis not present

## 2024-04-04 DIAGNOSIS — M6281 Muscle weakness (generalized): Secondary | ICD-10-CM | POA: Diagnosis not present

## 2024-04-04 DIAGNOSIS — N3281 Overactive bladder: Secondary | ICD-10-CM | POA: Diagnosis not present

## 2024-04-08 DIAGNOSIS — M6281 Muscle weakness (generalized): Secondary | ICD-10-CM | POA: Diagnosis not present

## 2024-04-08 DIAGNOSIS — Z96652 Presence of left artificial knee joint: Secondary | ICD-10-CM | POA: Diagnosis not present

## 2024-04-08 DIAGNOSIS — M25562 Pain in left knee: Secondary | ICD-10-CM | POA: Diagnosis not present

## 2024-04-08 DIAGNOSIS — R2689 Other abnormalities of gait and mobility: Secondary | ICD-10-CM | POA: Diagnosis not present

## 2024-04-11 DIAGNOSIS — Z96652 Presence of left artificial knee joint: Secondary | ICD-10-CM | POA: Diagnosis not present

## 2024-04-11 DIAGNOSIS — M6281 Muscle weakness (generalized): Secondary | ICD-10-CM | POA: Diagnosis not present

## 2024-04-11 DIAGNOSIS — R2689 Other abnormalities of gait and mobility: Secondary | ICD-10-CM | POA: Diagnosis not present

## 2024-04-11 DIAGNOSIS — M25562 Pain in left knee: Secondary | ICD-10-CM | POA: Diagnosis not present

## 2024-04-18 DIAGNOSIS — M25562 Pain in left knee: Secondary | ICD-10-CM | POA: Diagnosis not present

## 2024-04-18 DIAGNOSIS — M6281 Muscle weakness (generalized): Secondary | ICD-10-CM | POA: Diagnosis not present

## 2024-04-18 DIAGNOSIS — Z96652 Presence of left artificial knee joint: Secondary | ICD-10-CM | POA: Diagnosis not present

## 2024-04-18 DIAGNOSIS — R2689 Other abnormalities of gait and mobility: Secondary | ICD-10-CM | POA: Diagnosis not present

## 2024-04-24 DIAGNOSIS — L03115 Cellulitis of right lower limb: Secondary | ICD-10-CM | POA: Diagnosis not present

## 2024-05-08 DIAGNOSIS — N3281 Overactive bladder: Secondary | ICD-10-CM | POA: Diagnosis not present

## 2024-05-09 DIAGNOSIS — R600 Localized edema: Secondary | ICD-10-CM | POA: Diagnosis not present

## 2024-05-09 DIAGNOSIS — R0609 Other forms of dyspnea: Secondary | ICD-10-CM | POA: Diagnosis not present

## 2024-05-09 DIAGNOSIS — L03115 Cellulitis of right lower limb: Secondary | ICD-10-CM | POA: Diagnosis not present

## 2024-05-09 DIAGNOSIS — L03116 Cellulitis of left lower limb: Secondary | ICD-10-CM | POA: Diagnosis not present

## 2024-05-09 DIAGNOSIS — C44702 Unspecified malignant neoplasm of skin of right lower limb, including hip: Secondary | ICD-10-CM | POA: Diagnosis not present

## 2024-05-17 DIAGNOSIS — C44712 Basal cell carcinoma of skin of right lower limb, including hip: Secondary | ICD-10-CM | POA: Diagnosis not present

## 2024-05-17 DIAGNOSIS — C44629 Squamous cell carcinoma of skin of left upper limb, including shoulder: Secondary | ICD-10-CM | POA: Diagnosis not present

## 2024-05-21 DIAGNOSIS — L03116 Cellulitis of left lower limb: Secondary | ICD-10-CM | POA: Diagnosis not present

## 2024-05-21 DIAGNOSIS — C44702 Unspecified malignant neoplasm of skin of right lower limb, including hip: Secondary | ICD-10-CM | POA: Diagnosis not present

## 2024-05-21 DIAGNOSIS — L03115 Cellulitis of right lower limb: Secondary | ICD-10-CM | POA: Diagnosis not present

## 2024-05-21 DIAGNOSIS — T148XXA Other injury of unspecified body region, initial encounter: Secondary | ICD-10-CM | POA: Diagnosis not present

## 2024-06-14 ENCOUNTER — Other Ambulatory Visit: Payer: Self-pay | Admitting: Cardiology
